# Patient Record
Sex: Female | Born: 1950 | Race: White | Hispanic: No | Marital: Married | Smoking: Former smoker
Health system: Southern US, Community
[De-identification: ages and names within clinical notes are randomized; demographics above are authoritative.]

## PROBLEM LIST (undated history)

## (undated) DIAGNOSIS — M1990 Unspecified osteoarthritis, unspecified site: Secondary | ICD-10-CM

## (undated) DIAGNOSIS — F32A Depression, unspecified: Secondary | ICD-10-CM

## (undated) DIAGNOSIS — K219 Gastro-esophageal reflux disease without esophagitis: Secondary | ICD-10-CM

## (undated) DIAGNOSIS — E7800 Pure hypercholesterolemia, unspecified: Secondary | ICD-10-CM

## (undated) DIAGNOSIS — J40 Bronchitis, not specified as acute or chronic: Secondary | ICD-10-CM

## (undated) DIAGNOSIS — G971 Other reaction to spinal and lumbar puncture: Secondary | ICD-10-CM

## (undated) DIAGNOSIS — D649 Anemia, unspecified: Secondary | ICD-10-CM

## (undated) DIAGNOSIS — K589 Irritable bowel syndrome without diarrhea: Secondary | ICD-10-CM

## (undated) DIAGNOSIS — Z5189 Encounter for other specified aftercare: Secondary | ICD-10-CM

## (undated) DIAGNOSIS — R112 Nausea with vomiting, unspecified: Secondary | ICD-10-CM

## (undated) DIAGNOSIS — F329 Major depressive disorder, single episode, unspecified: Secondary | ICD-10-CM

## (undated) DIAGNOSIS — G8929 Other chronic pain: Secondary | ICD-10-CM

## (undated) DIAGNOSIS — M545 Low back pain: Secondary | ICD-10-CM

## (undated) DIAGNOSIS — Z9889 Other specified postprocedural states: Secondary | ICD-10-CM

## (undated) DIAGNOSIS — N390 Urinary tract infection, site not specified: Secondary | ICD-10-CM

## (undated) DIAGNOSIS — T4145XA Adverse effect of unspecified anesthetic, initial encounter: Secondary | ICD-10-CM

## (undated) HISTORY — PX: BACK SURGERY: SHX140

## (undated) HISTORY — PX: APPENDECTOMY: SHX54

## (undated) HISTORY — PX: CHOLECYSTECTOMY: SHX55

## (undated) HISTORY — PX: BREAST SURGERY: SHX581

## (undated) HISTORY — PX: DILATION AND CURETTAGE OF UTERUS: SHX78

## (undated) HISTORY — PX: HAMMER TOE SURGERY: SHX385

## (undated) HISTORY — PX: POSTERIOR FUSION LUMBAR SPINE: SUR632

## (undated) HISTORY — PX: TUBAL LIGATION: SHX77

## (undated) HISTORY — PX: HAND SURGERY: SHX662

## (undated) HISTORY — PX: NASAL SEPTUM SURGERY: SHX37

## (undated) HISTORY — PX: TONSILLECTOMY AND ADENOIDECTOMY: SUR1326

---

## 1969-07-31 DIAGNOSIS — Z5189 Encounter for other specified aftercare: Secondary | ICD-10-CM

## 1969-07-31 DIAGNOSIS — IMO0001 Reserved for inherently not codable concepts without codable children: Secondary | ICD-10-CM

## 1969-07-31 HISTORY — DX: Reserved for inherently not codable concepts without codable children: IMO0001

## 1969-07-31 HISTORY — PX: DIAGNOSTIC LAPAROSCOPY: SUR761

## 1969-07-31 HISTORY — DX: Encounter for other specified aftercare: Z51.89

## 1987-12-01 HISTORY — PX: HERNIA REPAIR: SHX51

## 1987-12-01 HISTORY — PX: VAGINAL HYSTERECTOMY: SUR661

## 1989-07-31 DIAGNOSIS — T8859XA Other complications of anesthesia, initial encounter: Secondary | ICD-10-CM

## 1989-07-31 HISTORY — DX: Other complications of anesthesia, initial encounter: T88.59XA

## 1998-04-08 ENCOUNTER — Ambulatory Visit (HOSPITAL_COMMUNITY): Admission: RE | Admit: 1998-04-08 | Discharge: 1998-04-08 | Payer: Self-pay | Admitting: Orthopedic Surgery

## 1998-06-13 ENCOUNTER — Other Ambulatory Visit: Admission: RE | Admit: 1998-06-13 | Discharge: 1998-06-13 | Payer: Self-pay | Admitting: Obstetrics & Gynecology

## 1998-08-08 ENCOUNTER — Ambulatory Visit (HOSPITAL_COMMUNITY): Admission: RE | Admit: 1998-08-08 | Discharge: 1998-08-08 | Payer: Self-pay | Admitting: Oral and Maxillofacial Surgery

## 1999-07-04 ENCOUNTER — Other Ambulatory Visit: Admission: RE | Admit: 1999-07-04 | Discharge: 1999-07-04 | Payer: Self-pay | Admitting: Obstetrics & Gynecology

## 1999-07-18 ENCOUNTER — Ambulatory Visit (HOSPITAL_BASED_OUTPATIENT_CLINIC_OR_DEPARTMENT_OTHER): Admission: RE | Admit: 1999-07-18 | Discharge: 1999-07-18 | Payer: Self-pay | Admitting: Orthopedic Surgery

## 2000-08-03 ENCOUNTER — Other Ambulatory Visit: Admission: RE | Admit: 2000-08-03 | Discharge: 2000-08-03 | Payer: Self-pay | Admitting: Obstetrics and Gynecology

## 2001-02-10 ENCOUNTER — Ambulatory Visit (HOSPITAL_COMMUNITY): Admission: RE | Admit: 2001-02-10 | Discharge: 2001-02-10 | Payer: Self-pay | Admitting: Gastroenterology

## 2001-09-26 ENCOUNTER — Other Ambulatory Visit: Admission: RE | Admit: 2001-09-26 | Discharge: 2001-09-26 | Payer: Self-pay | Admitting: Obstetrics & Gynecology

## 2002-12-07 ENCOUNTER — Other Ambulatory Visit: Admission: RE | Admit: 2002-12-07 | Discharge: 2002-12-07 | Payer: Self-pay | Admitting: Obstetrics & Gynecology

## 2004-01-03 ENCOUNTER — Other Ambulatory Visit: Admission: RE | Admit: 2004-01-03 | Discharge: 2004-01-03 | Payer: Self-pay | Admitting: Obstetrics & Gynecology

## 2004-02-08 ENCOUNTER — Encounter: Admission: RE | Admit: 2004-02-08 | Discharge: 2004-02-08 | Payer: Self-pay | Admitting: Gastroenterology

## 2004-02-12 ENCOUNTER — Encounter: Admission: RE | Admit: 2004-02-12 | Discharge: 2004-02-12 | Payer: Self-pay | Admitting: Gastroenterology

## 2004-04-23 ENCOUNTER — Other Ambulatory Visit: Admission: RE | Admit: 2004-04-23 | Discharge: 2004-04-23 | Payer: Self-pay | Admitting: Obstetrics & Gynecology

## 2005-02-05 ENCOUNTER — Ambulatory Visit (HOSPITAL_COMMUNITY): Admission: RE | Admit: 2005-02-05 | Discharge: 2005-02-05 | Payer: Self-pay | Admitting: Gastroenterology

## 2005-08-11 ENCOUNTER — Other Ambulatory Visit: Admission: RE | Admit: 2005-08-11 | Discharge: 2005-08-11 | Payer: Self-pay | Admitting: Obstetrics & Gynecology

## 2006-07-29 ENCOUNTER — Encounter: Admission: RE | Admit: 2006-07-29 | Discharge: 2006-07-29 | Payer: Self-pay | Admitting: Obstetrics & Gynecology

## 2007-04-20 ENCOUNTER — Ambulatory Visit: Payer: Self-pay | Admitting: Cardiology

## 2008-12-03 ENCOUNTER — Encounter: Admission: RE | Admit: 2008-12-03 | Discharge: 2008-12-03 | Payer: Self-pay | Admitting: Orthopedic Surgery

## 2008-12-14 ENCOUNTER — Encounter: Admission: RE | Admit: 2008-12-14 | Discharge: 2008-12-14 | Payer: Self-pay | Admitting: Orthopedic Surgery

## 2009-03-12 ENCOUNTER — Encounter: Admission: RE | Admit: 2009-03-12 | Discharge: 2009-03-12 | Payer: Self-pay | Admitting: Orthopedic Surgery

## 2009-06-21 ENCOUNTER — Ambulatory Visit (HOSPITAL_COMMUNITY): Admission: RE | Admit: 2009-06-21 | Discharge: 2009-06-21 | Payer: Self-pay | Admitting: Neurosurgery

## 2010-06-30 ENCOUNTER — Ambulatory Visit (HOSPITAL_COMMUNITY): Admission: RE | Admit: 2010-06-30 | Discharge: 2010-06-30 | Payer: Self-pay | Admitting: Surgery

## 2010-11-26 ENCOUNTER — Inpatient Hospital Stay (HOSPITAL_COMMUNITY)
Admission: EM | Admit: 2010-11-26 | Discharge: 2010-11-29 | Payer: Self-pay | Source: Home / Self Care | Attending: Gastroenterology | Admitting: Gastroenterology

## 2010-11-30 DIAGNOSIS — D649 Anemia, unspecified: Secondary | ICD-10-CM

## 2010-11-30 HISTORY — DX: Anemia, unspecified: D64.9

## 2010-12-21 ENCOUNTER — Encounter: Payer: Self-pay | Admitting: Obstetrics & Gynecology

## 2010-12-26 NOTE — H&P (Signed)
NAME:  Christine [name], Christine [name]:  [id number]  MEDICAL RECORD NO.:  [id number]          PATIENT TYPE:  EMS  LOCATION:  MAJO                         FACILITY:  [facility]  PHYSICIAN:  Massie Maroon, MD        DATE OF BIRTH:  [birth year]  DATE OF ADMISSION:  11/26/2010 DATE OF DISCHARGE:                             HISTORY & PHYSICAL   CHIEF COMPLAINT:  Diarrhea.  HISTORY OF PRESENT ILLNESS:  This is a 60 year old female with a history of depression and arthritis who apparently has been sick for about 3 weeks.  It started off with diarrhea around November 09, 2010.  She had diarrhea for 3 days and then it went away.  She then developed some pharyngitis and was treated with Mucinex.  She then eventually developed some sinusitis and was treated with Augmentin on December 18th.  She then developed nausea and vomiting and dry heaves on November 24, 2010 and diarrhea as well.  She was seen yesterday at [facility] by Dr. Magda Paganini and started on Flagyl.  She has received about 4 doses but has not been improving and so was sent to the ER for further evaluation. The patient herself denies any fever, chills, bright red blood per rectum.  She does note some black stool, but she has been taking some Pepto-Bismol recently.  The patient's stool was Hemoccult positive.  Her white count was mildly elevated at 13.9.  Lactic acid was within normal limits.  The patient was severely hypokalemic with a potassium of 2.6 which would be consistent with her story of nausea, vomiting and diarrhea.  Liver function tests were normal.  Renal function was intact. Lipase was negative.  CT scan was performed and showed evidence of distal colitis, a pattern which can be seen with infectious colitis including pseudomembranous colitis and ulcerative colitis.  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  There is no evidence of perforation, obstruction or abscess.  The patient will be admitted for  workup of diarrhea and colitis.  PAST MEDICAL HISTORY: 1. Depression. 2. Arthritis.  PAST SURGICAL HISTORY: 1. Umbilical hernia. 2. Hernia. 3. Breast reduction. 4. Breast lump, benign. 5. Bunionectomy. 6. Hammertoe surgery. 7. L4-L5 spinal fusion. 8. Appendectomy. 9. Cholecystectomy. 10.Tonsillectomy and adenoidectomy. 11.C-section.  SOCIAL HISTORY:  The patient does not smoke presently.  She quit in 1975 after smoking for about 2 years.  She occasionally drinks socially.  She lives at home and is married and has 2 children.  FAMILY HISTORY:  Mother is alive at age 30 and has heart disease.  Her father is alive at age 35 and has heart disease.  Her paternal grandmother died of colon cancer possibly in her late 39s.  There is no history of inflammatory bowel disease or celiac disease.  ALLERGIES:  CODEINE (CAUSES RASH), SULFA DRUGS, MULTIPLE PAIN MEDICATIONS THOUGH SHE IS ABLE TO TOLERATE DILAUDID.  MEDICATIONS: 1. Cymbalta 60 mg p.o. b.i.d. 2. Diclofenac unknown dose p.o. b.i.d. 3. Premarin 1 p.o. daily. 4. Prevacid 30 mg p.o. daily p.r.n. 5. Flexeril 10 mg p.o. t.i.d. p.r.n. 6. Flagyl 500 mg p.o.  t.i.d.  REVIEW OF SYSTEMS:  Positive for lower abdominal pain beginning on December 26th when she started to have nausea, vomiting and dry heaves. It is a dull pain.  REVIEW OF SYSTEMS:  Otherwise negative for all organ systems except for pertinent positives stated above.  PHYSICAL EXAMINATION:  VITAL SIGNS:  Temperature 98.9, pulse 97, blood pressure 105/69, pulse oximetry 97% on room air. HEENT:  Anicteric. NECK:  No JVD, no bruit. HEART:  Regular rate and rhythm.  S1-S2.  No murmurs, gallops or rubs. LUNGS:  Lungs are clear to auscultation bilaterally. ABDOMEN:  Soft, slightly obese, slightly tender in the left and right lower quadrants but no guarding or rebound and she does have normoactive bowel sounds.  EXTREMITIES:  No cyanosis, clubbing or edema. SKIN:  No  rashes. LYMPHATICS:  No adenopathy. NEUROLOGIC:  Nonfocal.  LABORATORIES:  WBC 13.9, hemoglobin 12.2, platelet count 243.  Lactic acid 1.4.  Sodium 137, potassium 2.6, BUN 6, creatinine 0.8.  AST 33, ALT 25, alkaline phosphatase 95, total bilirubin 0.5, lipase 29. Urinalysis is positive for nitrites, leukocyte esterase small and wbc's 3-6.  CT scan:  See above HPI for reading.  ASSESSMENT/PLAN: 1. Colitis, possibly Clostridium difficile.  Check stool for fecal     leukocytes, culture Clostridium difficile PCR and ova and     parasites.  We will empirically start her on Cipro 400 mg IV b.i.d.     along with Flagyl 500 mg IV t.i.d.  If C difficile testing is     positive, then I would taper down to Flagyl alone, and if she is     refractory to Flagyl, then consider starting her on vancomycin     orally versus rifaximin.  The patient can have Imodium 2 mg p.o.     daily as needed for diarrhea.  The patient will also be started on     Protonix 40 mg p.o. daily for GI prophylaxis. 2. Hypokalemia.  We will replete her potassium.  Check a magnesium     level and replete magnesium as needed. 3. Depression.  Continue Cymbalta.4. Arthritis.  We will use Tylenol and tramadol and stop diclofenac     for now in light of heme-positive stool.  Also NSAIDs could worsen     colitis. 5. DVT prophylaxis.  SCDs. 6. Disposition.  Consider GI consult in the morning for further     evaluation of colitis, infectious versus ulcerative colitis.     Massie Maroon, MD     JYK/MEDQ  D:  11/26/2010  T:  11/26/2010  Job:  [id number]  cc:   Danise Edge, M.D. Lunette Stands, M.D.  Electronically Signed by Pearson Grippe MD on 12/26/2010 09:00:34 PM

## 2011-02-09 LAB — DIFFERENTIAL
Basophils Absolute: 0.1 10*3/uL (ref 0.0–0.1)
Basophils Relative: 0 % (ref 0–1)
Basophils Relative: 0 % (ref 0–1)
Eosinophils Absolute: 0.2 10*3/uL (ref 0.0–0.7)
Eosinophils Absolute: 0.3 10*3/uL (ref 0.0–0.7)
Eosinophils Relative: 2 % (ref 0–5)
Lymphocytes Relative: 7 % — ABNORMAL LOW (ref 12–46)
Lymphs Abs: 1 10*3/uL (ref 0.7–4.0)
Monocytes Absolute: 0.9 10*3/uL (ref 0.1–1.0)
Monocytes Relative: 7 % (ref 3–12)
Monocytes Relative: 8 % (ref 3–12)
Neutro Abs: 11.7 10*3/uL — ABNORMAL HIGH (ref 1.7–7.7)
Neutrophils Relative %: 77 % (ref 43–77)
Neutrophils Relative %: 84 % — ABNORMAL HIGH (ref 43–77)

## 2011-02-09 LAB — URINALYSIS, ROUTINE W REFLEX MICROSCOPIC
Glucose, UA: NEGATIVE mg/dL
Hgb urine dipstick: NEGATIVE
Ketones, ur: 40 mg/dL — AB
Nitrite: POSITIVE — AB
Protein, ur: 30 mg/dL — AB
Specific Gravity, Urine: 1.024 (ref 1.005–1.030)
Urobilinogen, UA: 0.2 mg/dL (ref 0.0–1.0)
pH: 6 (ref 5.0–8.0)

## 2011-02-09 LAB — GIARDIA/CRYPTOSPORIDIUM SCREEN(EIA)
Cryptosporidium Screen (EIA): NEGATIVE
Giardia Screen - EIA: NEGATIVE

## 2011-02-09 LAB — RETICULIN ANTIBODIES, IGA W TITER: Reticulin Ab, IgA: NEGATIVE

## 2011-02-09 LAB — LACTIC ACID, PLASMA: Lactic Acid, Venous: 1.4 mmol/L (ref 0.5–2.2)

## 2011-02-09 LAB — COMPREHENSIVE METABOLIC PANEL
ALT: 20 U/L (ref 0–35)
ALT: 25 U/L (ref 0–35)
AST: 27 U/L (ref 0–37)
AST: 33 U/L (ref 0–37)
Albumin: 2.5 g/dL — ABNORMAL LOW (ref 3.5–5.2)
Albumin: 3.1 g/dL — ABNORMAL LOW (ref 3.5–5.2)
Alkaline Phosphatase: 77 U/L (ref 39–117)
Alkaline Phosphatase: 95 U/L (ref 39–117)
BUN: 6 mg/dL (ref 6–23)
CO2: 22 mEq/L (ref 19–32)
Calcium: 8.3 mg/dL — ABNORMAL LOW (ref 8.4–10.5)
Chloride: 107 mEq/L (ref 96–112)
Chloride: 107 mEq/L (ref 96–112)
Creatinine, Ser: 0.68 mg/dL (ref 0.4–1.2)
Creatinine, Ser: 0.8 mg/dL (ref 0.4–1.2)
GFR calc Af Amer: >60 mL/min (ref >60)
GFR calc Af Amer: >60 mL/min (ref >60)
GFR calc non Af Amer: >60 mL/min (ref >60)
Glucose, Bld: 129 mg/dL — ABNORMAL HIGH (ref 70–99)
Potassium: 2.6 mEq/L — CL (ref 3.5–5.1)
Potassium: 2.9 mEq/L — ABNORMAL LOW (ref 3.5–5.1)
Sodium: 137 mEq/L (ref 135–145)
Sodium: 137 mEq/L (ref 135–145)
Total Bilirubin: 0.5 mg/dL (ref 0.3–1.2)
Total Bilirubin: 0.6 mg/dL (ref 0.3–1.2)
Total Protein: 6.1 g/dL (ref 6.0–8.3)

## 2011-02-09 LAB — CBC
HCT: 36.5 % (ref 36.0–46.0)
Hemoglobin: 10.7 g/dL — ABNORMAL LOW (ref 12.0–15.0)
Hemoglobin: 12.2 g/dL (ref 12.0–15.0)
MCH: 30.5 pg (ref 26.0–34.0)
MCH: 30.5 pg (ref 26.0–34.0)
MCHC: 33.2 g/dL (ref 30.0–36.0)
MCHC: 33.4 g/dL (ref 30.0–36.0)
MCV: 91.3 fL (ref 78.0–100.0)
Platelets: 243 10*3/uL (ref 150–400)
RBC: 4 MIL/uL (ref 3.87–5.11)
RDW: 12.9 % (ref 11.5–15.5)
WBC: 13.9 10*3/uL — ABNORMAL HIGH (ref 4.0–10.5)

## 2011-02-09 LAB — FECAL LACTOFERRIN, QUANT: Fecal Lactoferrin: POSITIVE

## 2011-02-09 LAB — CLOSTRIDIUM DIFFICILE BY PCR

## 2011-02-09 LAB — STOOL CULTURE

## 2011-02-09 LAB — URINE MICROSCOPIC-ADD ON

## 2011-02-09 LAB — OCCULT BLOOD, POC DEVICE: Fecal Occult Bld: POSITIVE

## 2011-02-09 LAB — SEDIMENTATION RATE: Sed Rate: 20 mm/hr (ref 0–22)

## 2011-02-09 LAB — TISSUE TRANSGLUTAMINASE, IGA: Tissue Transglutaminase Ab, IgA: 6.8 U/mL (ref <20)

## 2011-02-09 LAB — TSH: TSH: 2.804 u[IU]/mL (ref 0.350–4.500)

## 2011-02-09 LAB — LIPASE, BLOOD: Lipase: 29 U/L (ref 11–59)

## 2011-02-14 LAB — SURGICAL PCR SCREEN
MRSA, PCR: NEGATIVE
Staphylococcus aureus: NEGATIVE

## 2011-02-14 LAB — CBC
Hemoglobin: 12.2 g/dL (ref 12.0–15.0)
MCHC: 33.7 g/dL (ref 30.0–36.0)
RDW: 12.9 % (ref 11.5–15.5)

## 2011-02-14 LAB — BASIC METABOLIC PANEL
BUN: 11 mg/dL (ref 6–23)
Calcium: 9 mg/dL (ref 8.4–10.5)
GFR calc non Af Amer: >60 mL/min (ref >60)
Glucose, Bld: 89 mg/dL (ref 70–99)
Sodium: 138 mEq/L (ref 135–145)

## 2011-03-08 LAB — DIFFERENTIAL
Basophils Absolute: 0 10*3/uL (ref 0.0–0.1)
Basophils Relative: 1 % (ref 0–1)
Eosinophils Relative: 1 % (ref 0–5)
Monocytes Absolute: 0.5 10*3/uL (ref 0.1–1.0)

## 2011-03-08 LAB — CBC
HCT: 36.5 % (ref 36.0–46.0)
Hemoglobin: 12.7 g/dL (ref 12.0–15.0)
MCHC: 34.8 g/dL (ref 30.0–36.0)
MCV: 91.5 fL (ref 78.0–100.0)
RDW: 12.6 % (ref 11.5–15.5)

## 2011-04-14 NOTE — Op Note (Signed)
NAME:  Christine [name], Christine [name]:  [id number]   MEDICAL RECORD NO.:  [id number]          PATIENT TYPE:  OIB   LOCATION:  3599                         FACILITY:  [facility]   PHYSICIAN:  Henry A. [name], M.D.    DATE OF BIRTH:  05-07-1951   DATE OF PROCEDURE:  06/21/2009  DATE OF DISCHARGE:                               OPERATIVE REPORT   PREOPERATIVE DIAGNOSIS:  Left L5-S1 herniated nucleus pulposus with  radiculopathy.   POSTOPERATIVE DIAGNOSIS:  Left L5-S1 herniated nucleus pulposus with  radiculopathy.   PROCEDURE:  Left L5-S1 laminotomy microdiskectomy.   SURGEON:  Christine Maser. Pool, MD   ASSISTANT:  None.   ANESTHESIA:  General endotracheal.   INDICATION:  Ms. [name] is a 60 year old female with history of back and  left lower extremity pain.  She has a left-sided S1 radiculopathy  failing conservative management.  Workup demonstrated evidence of a  small left-sided L5-S1 disk herniation within the lateral recess causing  marked compression of left-sided S1 nerve root.  The patient presents  now for microdiskectomy in hopes of improving her symptoms.   OPERATIVE NOTE:  The patient was taken to the operating room table and  placed in the supine position.  Adequate level of anesthesia was  achieved.  The patient was positioned onto Wilson frame, appropriately  padded the patient's lumbar region and prepped and draped sterilely.  A  #10 blade was used to make a curvilinear skin incision overlying the L4-  L5 interspace.  This carried sharply in the midline.  Subperiosteal  dissection was performed exposing lamina and facet joints of L4 and L5  on the left side.  Deep self-retaining retractors were placed.  Intraoperative x-rays were taken and level was confirmed.  Laminotomy  was then performed using high speed drill and Kerrison rongeurs.  Underlying thecal sac and exiting S1 nerve root.  Microscope was brought  in and microdissection on left-sided S1 nerve root and  underlying disk  herniation coagulated and cut.  Thecal sac and S1 nerve root gently  mobilized and tracked towards midline.  A focal disk herniation was  encountered just beneath where the S1 nerve root had been.  This was  then incised with #15 blade and removed using pituitary rongeurs.  All  loose or obviously degenerative disk was then removed from the  interspace.  At this point, a very thorough diskectomy was achieved.  There was no injury to thecal sac or nerve roots.  Wound was then  irrigated with antibiotic solution.  Gelfoam was placed topically for  hemostasis and found to be good.  Microscope and retractors were then  removed.  Hemostasis was then achieved with electrocautery.  The wounds  were then closed in layers with Vicryl suture.  Steri-Strips and sterile  dressings were applied.  There were no complications.  The patient  tolerated the procedure well and she returned to the recovery room  postoperatively.          ______________________________  Christine [name], M.D.    HAP/MEDQ  D:  06/21/2009  T:  06/22/2009  Job:  [id number] 

## 2011-04-14 NOTE — Assessment & Plan Note (Signed)
[name]                            CARDIOLOGY OFFICE NOTE   NAME:Christine [name]                       MRN:          [id number]  DATE:04/20/2007                            DOB:          May 14, 1951    Dr. Abbey Chatters has asked me to consult on Christine [name] for mixed  hyperlipidemia.   She is a delightful 60 year old married white female who has had  hyperlipidemia that was detected a couple of years ago.  Diet has  failed.  Her total cholesterol is 245, triglycerides 228, HDL 55, LDL  144, VLDL 46.  Her total cholesterol and HDL ratio is 4.5.  Her TSH was  normal.   Her other cardiac risk factors are fairly negative.  She is mildly  overweight and because of arthritis is fairly in mobile.  She does enjoy  walking, but has been limited with knee problems.   Her father had a heart attack in his early 59s, but had other risk  factors.   She quit smoking in 1975.  She does not have hypertension or diabetes.   PAST MEDICAL HISTORY:   CURRENT MEDICATIONS:  1. Diclofenac 150 mg a day.  2. Cymbalta 30 mg a day.  3. Premarin 0.92 mg a day.   PAST SURGICAL HISTORY:  C section, hysterectomy, umbilical hernia  repair, tonsillectomy, appendectomy, __________ , spinal fusion, breast  tumors and breast reduction.  Dates were listed in the chart.   FAMILY HISTORY:  As above.   SOCIAL HISTORY:  She is a Field seismologist.  She is  fairly sedentary at work.   She is married and has 2 children.   REVIEW OF SYSTEMS:  Other than the HPI, positive for constipation,  urinary problems and arthritis, but negative otherwise.   EXAMINATION:  She is 5 foot 5 1/4 inches, she weighs 186 pounds.  HEENT:  Normocephalic, atraumatic, PERRLA, extraocular movements intact,  sclerae clear, facial symmetry is normal, carotids are full without  bruits, there is no JVD, thyroid is not enlarged, trachea is midline.  LUNGS:  Clear.  HEART:  Reveals a  normal S1, S2 without murmur or gallop.  ABDOMINAL:  Soft, good bowel sounds.  EXTREMITIES:  Reveal no edema, pulses are intact.  She has varicose  veins, no evidence of DVT.  NEURO:  Intact.  SKIN:  Intact.   Her electrocardiogram is normal.   ASSESSMENT/PLAN:  I have had about a 20 minute discussion with Mrs.  Christine [name] about her cardiovascular risk.  She certainly is in a low risk  category at the present time because of being relatively young and no  other major risk factors other than her hyperlipidemia.  We spent most  of the time talking about therapeutic lifestyle changes including  __________  bicycle for 3 hours per week, decreasing complex and simple  sugars, and maintaining her weight.   At the present time I do not think she needs pharmacological therapy.  I  have asked her to check back with [place] in about 3-5 years as guidelines  change and other things  change.     Thomas C. Daleen Squibb, MD, [facility]  Electronically Signed    TCW/MedQ  DD: 04/20/2007  DT: 04/20/2007  Job #: [id number]

## 2011-04-17 NOTE — Procedures (Signed)
[place]  Patient:    Christine [name], Christine [name]                      MRN: [id number] Proc. Date: 02/10/01 Attending:  Verlin Grills, M.D.                           Procedure Report  PROCEDURE:  Colonoscopy.  PROCEDURE INDICATION:  [name] (date of birth 04-14-1951) is a 60 year old female, who has intermittent hematochezia.  She is due for surveillance colonoscopy and polypectomy to prevent colon cancer.  She had a maternal grandmother with colon cancer but no first-degree relatives with colon cancer.  ENDOSCOPIST:  Verlin Grills, M.D.  PREMEDICATION:  Fentanyl 105 mcg, Versed 10 mg.  ENDOSCOPE:  Olympus pediatric colonoscope.  DESCRIPTION OF PROCEDURE:  After obtaining informed consent, the patient was placed in the left lateral decubitus position.  I administered intravenous fentanyl and intravenous Versed to achieve conscious sedation for the procedure.  The patients blood pressure, oxygen saturation, and cardiac rhythm were monitored throughout the procedure and documented in the medical record.  Anal inspection was normal.  Digital rectal exam was normal.  The Olympus pediatric video colonoscope was introduced into the rectum and under direct vision advanced to the cecum, as identified by a normal-appearing ileocecal valve.  Colonic preparation for the exam today was excellent.  Rectum normal.  Sigmoid colon normal.  Descending colon normal.  Splenic flexure normal.  Transverse colon normal.  Hepatic flexure normal.  Ascending colon normal.  Cecum and ileocecal valve normal.  ASSESSMENT:  Normal proctocolonoscopy to the cecum.  No endoscopic evidence for the presence of colorectal neoplasia.  RECOMMENDATIONS:  [name] is at average risk for the development of colon cancer during her lifetime.  She should undergo a repeat colonoscopy in approximately 10 years.DD:  02/10/01 TD:   02/10/01 Job: [id number]

## 2011-11-03 ENCOUNTER — Emergency Department (HOSPITAL_COMMUNITY)
Admission: EM | Admit: 2011-11-03 | Discharge: 2011-11-04 | Disposition: A | Discharge status: Home / Self Care | Payer: 59 | Attending: Emergency Medicine | Admitting: Emergency Medicine

## 2011-11-03 ENCOUNTER — Encounter: Payer: Self-pay | Admitting: Emergency Medicine

## 2011-11-03 DIAGNOSIS — Y92009 Unspecified place in unspecified non-institutional (private) residence as the place of occurrence of the external cause: Secondary | ICD-10-CM | POA: Insufficient documentation

## 2011-11-03 DIAGNOSIS — Z79899 Other long term (current) drug therapy: Secondary | ICD-10-CM | POA: Insufficient documentation

## 2011-11-03 DIAGNOSIS — S81009A Unspecified open wound, unspecified knee, initial encounter: Secondary | ICD-10-CM | POA: Insufficient documentation

## 2011-11-03 DIAGNOSIS — W540XXA Bitten by dog, initial encounter: Secondary | ICD-10-CM | POA: Insufficient documentation

## 2011-11-03 DIAGNOSIS — S91009A Unspecified open wound, unspecified ankle, initial encounter: Secondary | ICD-10-CM | POA: Insufficient documentation

## 2011-11-03 DIAGNOSIS — S8000XA Contusion of unspecified knee, initial encounter: Secondary | ICD-10-CM | POA: Insufficient documentation

## 2011-11-03 DIAGNOSIS — IMO0002 Reserved for concepts with insufficient information to code with codable children: Secondary | ICD-10-CM | POA: Insufficient documentation

## 2011-11-03 DIAGNOSIS — S5010XA Contusion of unspecified forearm, initial encounter: Secondary | ICD-10-CM | POA: Insufficient documentation

## 2011-11-03 DIAGNOSIS — S51809A Unspecified open wound of unspecified forearm, initial encounter: Secondary | ICD-10-CM | POA: Insufficient documentation

## 2011-11-03 MED ORDER — ONDANSETRON 4 MG PO TBDP
4.0000 mg | ORAL_TABLET | Freq: Once | ORAL | Status: AC
  Administered 2011-11-03: 4 mg via ORAL
  Filled 2011-11-03: qty 1

## 2011-11-03 MED ORDER — TETANUS-DIPHTHERIA TOXOIDS TD 5-2 LFU IM INJ
0.5000 mL | INJECTION | Freq: Once | INTRAMUSCULAR | Status: AC
  Administered 2011-11-03: 0.5 mL via INTRAMUSCULAR
  Filled 2011-11-03: qty 0.5

## 2011-11-03 MED ORDER — BACITRACIN-NEOMYCIN-POLYMYXIN OINTMENT TUBE
TOPICAL_OINTMENT | Freq: Every day | CUTANEOUS | Status: DC
  Administered 2011-11-03: via TOPICAL
  Filled 2011-11-03: qty 15

## 2011-11-03 NOTE — ED Notes (Signed)
PT. PRESENTS WITH MULTIPLE DOG BITE AT LEFT FOREARM , RIGHT KNEE / LEFT KNEE AND LEFT FOREHEAD BRUISE THIS EVENING  . BLEEDING CONTROLLED.

## 2011-11-04 MED ORDER — MUPIROCIN CALCIUM 2 % NA OINT: TOPICAL_OINTMENT | NASAL | Status: AC

## 2011-11-04 NOTE — ED Provider Notes (Signed)
Medical screening examination/treatment/procedure(s) were performed by non-physician practitioner and as supervising physician I was immediately available for consultation/collaboration.   Hanley Seamen, MD 11/04/11 [phone]

## 2011-11-04 NOTE — ED Provider Notes (Signed)
History     CSN: [id number] Arrival date & time: 11/03/2011  8:00 PM   First MD Initiated Contact with Patient 11/03/11 2256      Chief Complaint  Patient presents with  . Animal Bite    (Consider location/radiation/quality/duration/timing/severity/associated sxs/prior treatment) HPI Comments: Patient has been careing for a stray dog for the past 9 months for the last 3 weeks.  Dog has been in the home has received first rabies immunization recently had puppies.  Patient went to touch in all and was bitten on the left forearm bilateral knees.  One small abrasion to right breast.  Patient's last tetanus was greater than 5 years ago.  Right forearm shows some bruising under the bite, as well as left medial knee.  No decrease in range of motion of extremities.  Patient is able to ambulate without difficulty  Patient is a 60 y.o. female presenting with animal bite. The history is provided by the patient.  Animal Bite  The incident occurred just prior to arrival. The incident occurred at home. There is an injury to the chest. There is an injury to the left forearm. There is an injury to the right knee and left knee. The pain is mild. It is unlikely that a foreign body is present. Pertinent negatives include no numbness, no inability to bear weight, no neck pain, no focal weakness and no weakness. There have been no prior injuries to these areas. She is right-handed. Her tetanus status is out of date.    History reviewed. No pertinent past medical history.  Past Surgical History  Procedure Date  . Appendectomy   . Tonsillectomy   . Back surgery   . Cholecystectomy     No family history on file.  History  Substance Use Topics  . Smoking status: Never Smoker   . Smokeless tobacco: Not on file  . Alcohol Use: Yes    OB History    Grav Para Term Preterm Abortions TAB SAB Ect Mult Living                  Review of Systems  Constitutional: Negative.   HENT: Negative.  Negative for  neck pain.   Eyes: Negative.   Respiratory: Negative.   Cardiovascular: Negative.   Gastrointestinal: Negative.   Genitourinary: Negative.   Musculoskeletal: Negative for joint swelling.  Neurological: Negative for focal weakness, weakness and numbness.  Hematological: Negative.   Psychiatric/Behavioral: Negative.     Allergies  Other and Sulfa antibiotics  Home Medications   Current Outpatient Rx  Name Route Sig Dispense Refill  . DICLOFENAC SODIUM 75 MG PO TBEC Oral Take 75 mg by mouth 2 (two) times daily.      . DULOXETINE HCL 60 MG PO CPEP Oral Take 60 mg by mouth daily.      Marland Kitchen ESTROGENS CONJUGATED 0.9 MG PO TABS Oral Take 0.9 mg by mouth 2 (two) times daily.      Marland Kitchen MUPIROCIN CALCIUM 2 % NA OINT  Apply in each nostril daily 10 g 0    BP 148/100  Pulse 107  Temp(Src) 98.5 F (36.9 C) (Oral)  Resp 20  SpO2 98%  Physical Exam  Constitutional: She is oriented to person, place, and time. She appears well-developed.  HENT:  Head: Normocephalic.  Eyes: Pupils are equal, round, and reactive to light.  Neck: Neck supple.  Cardiovascular: Normal rate.   Pulmonary/Chest: Effort normal.  Abdominal: Soft.  Musculoskeletal: Normal range of motion.  Abrasions with bruising and superficial puncture wounds to right forearm.  Distal anterior portion.  Abrasions and contusions to left medial knee with superficial puncture. Abrasion to anterior right knee Small abrasion, right breast  Neurological: She is alert and oriented to person, place, and time.  Skin: Skin is warm.  Psychiatric: She has a normal mood and affect.    ED Course  Procedures (including critical care time)  Labs Reviewed - No data to display No results found.   1. Bite from dog     All wounds scrubbed with Betadine, dressed with Neosporin tetanus updated.  Due to patient's history of C. difficile.  She has requested that we hold off on administering antibiotics.  At this time.  She will watch closely  for any sign of infection.  Followup with her doctor  MDM  Abrasions punctures lacerations after wound clean and most are abrasions, superficial pinching, and superficial punctures.  No sutures required        Arman Filter, NP 11/04/11 0050  Arman Filter, NP 11/04/11 [phone]

## 2012-03-31 ENCOUNTER — Encounter (HOSPITAL_COMMUNITY): Payer: Self-pay | Admitting: Pharmacy Technician

## 2012-04-07 ENCOUNTER — Other Ambulatory Visit (HOSPITAL_COMMUNITY): Payer: 59

## 2012-04-07 ENCOUNTER — Encounter (HOSPITAL_COMMUNITY)
Admission: RE | Admit: 2012-04-07 | Discharge: 2012-04-07 | Disposition: A | Discharge status: Home / Self Care | Payer: 59 | Source: Ambulatory Visit | Attending: Orthopedic Surgery | Admitting: Orthopedic Surgery

## 2012-04-07 ENCOUNTER — Encounter (HOSPITAL_COMMUNITY)
Admission: RE | Admit: 2012-04-07 | Discharge: 2012-04-07 | Disposition: A | Discharge status: Home / Self Care | Payer: 59 | Source: Ambulatory Visit | Attending: Surgery | Admitting: Surgery

## 2012-04-07 ENCOUNTER — Encounter (HOSPITAL_COMMUNITY): Payer: Self-pay

## 2012-04-07 HISTORY — DX: Encounter for other specified aftercare: Z51.89

## 2012-04-07 HISTORY — DX: Anemia, unspecified: D64.9

## 2012-04-07 HISTORY — DX: Adverse effect of unspecified anesthetic, initial encounter: T41.45XA

## 2012-04-07 HISTORY — DX: Gastro-esophageal reflux disease without esophagitis: K21.9

## 2012-04-07 HISTORY — DX: Major depressive disorder, single episode, unspecified: F32.9

## 2012-04-07 HISTORY — DX: Unspecified osteoarthritis, unspecified site: M19.90

## 2012-04-07 HISTORY — DX: Depression, unspecified: F32.A

## 2012-04-07 LAB — CBC
HCT: 36.3 % (ref 36.0–46.0)
Hemoglobin: 12.2 g/dL (ref 12.0–15.0)
MCV: 92.6 fL (ref 78.0–100.0)
Platelets: 319 10*3/uL (ref 150–400)
RBC: 3.92 MIL/uL (ref 3.87–5.11)
WBC: 6.2 10*3/uL (ref 4.0–10.5)

## 2012-04-07 LAB — COMPREHENSIVE METABOLIC PANEL
ALT: 19 U/L (ref 0–35)
AST: 20 U/L (ref 0–37)
CO2: 28 mEq/L (ref 19–32)
Chloride: 101 mEq/L (ref 96–112)
Creatinine, Ser: 0.61 mg/dL (ref 0.50–1.10)
GFR calc Af Amer: >90 mL/min (ref >90)
GFR calc non Af Amer: >90 mL/min (ref >90)
Glucose, Bld: 85 mg/dL (ref 70–99)
Total Bilirubin: 0.3 mg/dL (ref 0.3–1.2)

## 2012-04-07 LAB — URINALYSIS, ROUTINE W REFLEX MICROSCOPIC
Bilirubin Urine: NEGATIVE
Glucose, UA: NEGATIVE mg/dL
Ketones, ur: NEGATIVE mg/dL
pH: 6 (ref 5.0–8.0)

## 2012-04-07 LAB — PROTIME-INR: INR: 0.91 (ref 0.00–1.49)

## 2012-04-07 LAB — TYPE AND SCREEN
ABO/RH(D): B POS
Antibody Screen: NEGATIVE

## 2012-04-07 NOTE — Pre-Procedure Instructions (Signed)
20 Christine [name]  04/07/2012   Your procedure is scheduled on:  04/13/2012  Report to [facility] at 0750 AM.  Call this number if you have problems the morning of surgery: [phone]   Remember:   Do not eat food:After Midnight.  May have clear liquids: up to 4 Hours before arrival.0350   Do not drink liquids after  0350 am the day of surgery.   Clear liquids include soda, tea, black coffee, apple or grape juice, broth.  Take these medicines the morning of surgery with A SIP OF WATER: cymbalta  Premarin    Do not wear jewelry, make-up or nail polish.  Do not wear lotions, powders, or perfumes. You may wear deodorant.  Do not shave 48 hours prior to surgery.  Do not bring valuables to the hospital.  Contacts, dentures or bridgework may not be worn into surgery.  Leave suitcase in the car. After surgery it may be brought to your room.  For patients admitted to the hospital, checkout time is 11:00 AM the day of discharge.   Patients discharged the day of surgery will not be allowed to drive home.  Name and phone number of your driver: Bernette Redbird ---spouse      Special Instructions: CHG Shower Use Special Wash: 1/2 bottle night before surgery and 1/2 bottle morning of surgery.   Please read over the following fact sheets that you were given: Pain Booklet, Coughing and Deep Breathing, Blood Transfusion Information, Total Joint Packet and MRSA Information

## 2012-04-12 MED ORDER — CEFAZOLIN SODIUM-DEXTROSE 2-3 GM-% IV SOLR
2.0000 g | INTRAVENOUS | Status: AC
  Administered 2012-04-13: 2 g via INTRAVENOUS
  Filled 2012-04-12: qty 50

## 2012-04-12 NOTE — H&P (Signed)
[facility] [address]   [address] [place], [place] [address] [phone] A Division of [facility]  Loreta Ave, M.D.     Robert A. Thurston Hole, M.D.     Lunette Stands, M.D. Eulas Post, M.D.    Buford Dresser, M.D. Estell Harpin, M.D. Ralene Cork, D.O.          Genene Churn. Barry Dienes, PA-C            Kirstin A. Shepperson, PA-C [place], OPA-C   RE: Daryan, [place]                                [phone]      DOB: 02-04-1951 PROGRESS NOTE: 04-05-12 Chief complaint: Right knee pain.  History of present illness: 58 one year-old white female with a history of end stage DJD, right knee, and chronic pain.  Returns.  States that knee pain unchanged from previous visit.  She is wanting to proceed with total knee replacement as scheduled.  Gastroenterologist is Dr. Danise Edge and patient states that he also handles primary care.  Patient states that last year she was prescribed Amoxicillin for a sinus infection and then eventually had GI issues related to c-diff.  She required hospital admission for four days.  She also admits to having a history of acid reflux and anemia.  We have not received pre-op medical clearance.  States that she has had a colonoscopy in the past.  Denies nausea, vomiting, abdominal pain, constipation, diarrhea, melena or hematochezia.   Current medications: Premarin, Voltaren, Cymbalta, Flexeril, Vitamin D, fish oil and multivitamin. Allergies: Codeine, Percocet, Vicodin, Darvocet and Morphine caused nausea.  Sulfa causes a rash. Past medical/surgical history: C-diff requiring hospital admission in 2012, C-section, cholecystectomy, hysterectomy, bunion surgery, lumbar microdiscectomy, tonsillectomy and left hand surgery. Review of systems: Patient currently denies fevers, chills, lightheadedness, dizziness, cardiac, pulmonary, GI or GU issues.   Family history: Positive for heart disease,  hypertension, diabetes and cancer. Social history: Quit smoking in 1975 and smoked one-half pack per day, admits occasional alcohol consumption.  Patient is married.        EXAMINATION: Height: 5?4.  Weight: 176 pounds.  Temperature: 98.2.  Pulse: 80.  Respirations: 16.  Blood pressure: 144/90.  Pleasant white female, alert and oriented x 3 and in no acute distress.  No increase in respiratory effort.  Head is normocephalic, a traumatic.  PERRLA, EOMI.  Cervical spine unremarkable.  Lungs: CTA bilaterally.  No wheezes.  Heart: RRR.  No murmurs noted.  Abdomen: Round and non-distended.  NBS x 4.  Soft and non-tender.  Right knee: Decreased range of motion.  Positive crepitus.  Joint line tender.  Positive effusion.  Ligaments stable.  Calf non-tender.  Neurovascularly intact.  Skin warm and dry.   IMPRESSION: End stage DJD, right knee, and chronic pain.  Failed conservative treatment.  PLAN: We will proceed with right total knee replacement as scheduled. Surgical procedure, along with potential rehab/recovery time discussed. All questions answered. Due to her GI issues that we discussed today we will see if we can get a letter of clearance from Dr. Danise Edge. I did speak with Dr. Henriette Combs nurse today and she will contact Ms. [name] for an appointment this week.  Post-op she will need short rehab placement at [facility].  All questions answered.    Loreta Ave, M.D. Electronically verified  by Loreta Ave, M.D. DFM(JMO):jjh Cc: Dr. Danise Edge, fax: [phone] D 04-06-12 T 04-07-12

## 2012-04-13 ENCOUNTER — Encounter (HOSPITAL_COMMUNITY): Payer: Self-pay | Admitting: *Deleted

## 2012-04-13 ENCOUNTER — Encounter (HOSPITAL_COMMUNITY)
Admission: RE | Disposition: A | Discharge status: Home / Self Care | Payer: Self-pay | Source: Ambulatory Visit | Attending: Orthopedic Surgery

## 2012-04-13 ENCOUNTER — Encounter (HOSPITAL_COMMUNITY): Payer: Self-pay | Admitting: Anesthesiology

## 2012-04-13 ENCOUNTER — Ambulatory Visit (HOSPITAL_COMMUNITY): Payer: 59 | Admitting: Anesthesiology

## 2012-04-13 ENCOUNTER — Inpatient Hospital Stay (HOSPITAL_COMMUNITY)
Admission: RE | Admit: 2012-04-13 | Discharge: 2012-04-15 | DRG: 470 | Disposition: A | Discharge status: Home / Self Care | Payer: 59 | Source: Ambulatory Visit | Attending: Orthopedic Surgery | Admitting: Orthopedic Surgery

## 2012-04-13 ENCOUNTER — Inpatient Hospital Stay (HOSPITAL_COMMUNITY): Payer: 59

## 2012-04-13 DIAGNOSIS — F3289 Other specified depressive episodes: Secondary | ICD-10-CM | POA: Diagnosis present

## 2012-04-13 DIAGNOSIS — Z90710 Acquired absence of both cervix and uterus: Secondary | ICD-10-CM

## 2012-04-13 DIAGNOSIS — Z471 Aftercare following joint replacement surgery: Secondary | ICD-10-CM

## 2012-04-13 DIAGNOSIS — F329 Major depressive disorder, single episode, unspecified: Secondary | ICD-10-CM | POA: Diagnosis present

## 2012-04-13 DIAGNOSIS — D649 Anemia, unspecified: Secondary | ICD-10-CM | POA: Diagnosis present

## 2012-04-13 DIAGNOSIS — Z87891 Personal history of nicotine dependence: Secondary | ICD-10-CM

## 2012-04-13 DIAGNOSIS — Z882 Allergy status to sulfonamides status: Secondary | ICD-10-CM

## 2012-04-13 DIAGNOSIS — Z885 Allergy status to narcotic agent status: Secondary | ICD-10-CM

## 2012-04-13 DIAGNOSIS — G8929 Other chronic pain: Secondary | ICD-10-CM | POA: Diagnosis present

## 2012-04-13 DIAGNOSIS — M1710 Unilateral primary osteoarthritis, unspecified knee: Principal | ICD-10-CM | POA: Diagnosis present

## 2012-04-13 DIAGNOSIS — K219 Gastro-esophageal reflux disease without esophagitis: Secondary | ICD-10-CM | POA: Diagnosis present

## 2012-04-13 DIAGNOSIS — Z9089 Acquired absence of other organs: Secondary | ICD-10-CM

## 2012-04-13 HISTORY — PX: TOTAL KNEE ARTHROPLASTY: SHX125

## 2012-04-13 SURGERY — ARTHROPLASTY, KNEE, TOTAL
Anesthesia: General | Site: Knee | Laterality: Right | Wound class: Clean

## 2012-04-13 MED ORDER — DEXAMETHASONE SODIUM PHOSPHATE 4 MG/ML IJ SOLN
INTRAMUSCULAR | Status: DC | PRN
  Administered 2012-04-13: 8 mg via INTRAVENOUS

## 2012-04-13 MED ORDER — ONDANSETRON HCL 4 MG/2ML IJ SOLN: 4.0000 mg | Freq: Four times a day (QID) | INTRAMUSCULAR | Status: DC | PRN

## 2012-04-13 MED ORDER — METHOCARBAMOL 500 MG PO TABS
500.0000 mg | ORAL_TABLET | Freq: Four times a day (QID) | ORAL | Status: DC | PRN
  Administered 2012-04-13 – 2012-04-15 (×4): 500 mg via ORAL
  Filled 2012-04-13 (×4): qty 1

## 2012-04-13 MED ORDER — ENOXAPARIN SODIUM 30 MG/0.3ML ~~LOC~~ SOLN
30.0000 mg | Freq: Two times a day (BID) | SUBCUTANEOUS | Status: DC
  Administered 2012-04-14 – 2012-04-15 (×3): 30 mg via SUBCUTANEOUS
  Filled 2012-04-13 (×5): qty 0.3

## 2012-04-13 MED ORDER — DROPERIDOL 2.5 MG/ML IJ SOLN: 0.6250 mg | INTRAMUSCULAR | Status: DC | PRN

## 2012-04-13 MED ORDER — METOCLOPRAMIDE HCL 5 MG/ML IJ SOLN
5.0000 mg | Freq: Three times a day (TID) | INTRAMUSCULAR | Status: DC | PRN
  Filled 2012-04-13: qty 2

## 2012-04-13 MED ORDER — METHOCARBAMOL 100 MG/ML IJ SOLN
500.0000 mg | INTRAVENOUS | Status: AC
  Administered 2012-04-13: 500 mg via INTRAVENOUS
  Filled 2012-04-13: qty 5

## 2012-04-13 MED ORDER — DULOXETINE HCL 60 MG PO CPEP
60.0000 mg | ORAL_CAPSULE | Freq: Every day | ORAL | Status: DC
  Administered 2012-04-14 – 2012-04-15 (×2): 60 mg via ORAL
  Filled 2012-04-13 (×2): qty 1

## 2012-04-13 MED ORDER — METHOCARBAMOL 100 MG/ML IJ SOLN
500.0000 mg | Freq: Four times a day (QID) | INTRAVENOUS | Status: DC | PRN
  Filled 2012-04-13: qty 5

## 2012-04-13 MED ORDER — DIPHENHYDRAMINE HCL 12.5 MG/5ML PO ELIX
12.5000 mg | ORAL_SOLUTION | Freq: Four times a day (QID) | ORAL | Status: DC | PRN
  Administered 2012-04-13 – 2012-04-14 (×2): 12.5 mg via ORAL
  Filled 2012-04-13 (×2): qty 10

## 2012-04-13 MED ORDER — NALOXONE HCL 0.4 MG/ML IJ SOLN: 0.4000 mg | INTRAMUSCULAR | Status: DC | PRN

## 2012-04-13 MED ORDER — HYDROMORPHONE 0.3 MG/ML IV SOLN
INTRAVENOUS | Status: DC
  Administered 2012-04-13: 12:00:00 via INTRAVENOUS
  Administered 2012-04-13: 0.953 mg via INTRAVENOUS
  Administered 2012-04-13: 1.8 mg via INTRAVENOUS
  Administered 2012-04-13: 0.68 mg via INTRAVENOUS
  Administered 2012-04-14: 0.9 mg via INTRAVENOUS
  Administered 2012-04-14: 1.37 mg via INTRAVENOUS
  Administered 2012-04-14: 07:00:00 via INTRAVENOUS
  Filled 2012-04-13: qty 25

## 2012-04-13 MED ORDER — FENTANYL CITRATE 0.05 MG/ML IJ SOLN
INTRAMUSCULAR | Status: AC
  Filled 2012-04-13: qty 2

## 2012-04-13 MED ORDER — DIPHENHYDRAMINE HCL 50 MG/ML IJ SOLN: 12.5000 mg | Freq: Four times a day (QID) | INTRAMUSCULAR | Status: DC | PRN

## 2012-04-13 MED ORDER — CEFAZOLIN SODIUM 1-5 GM-% IV SOLN
1.0000 g | Freq: Three times a day (TID) | INTRAVENOUS | Status: AC
  Administered 2012-04-13 – 2012-04-14 (×3): 1 g via INTRAVENOUS
  Filled 2012-04-13 (×4): qty 50

## 2012-04-13 MED ORDER — POTASSIUM CHLORIDE IN NACL 20-0.9 MEQ/L-% IV SOLN
INTRAVENOUS | Status: DC
  Administered 2012-04-13: 16:00:00 via INTRAVENOUS
  Filled 2012-04-13 (×6): qty 1000

## 2012-04-13 MED ORDER — LIDOCAINE HCL (CARDIAC) 20 MG/ML IV SOLN
INTRAVENOUS | Status: DC | PRN
  Administered 2012-04-13: 100 mg via INTRAVENOUS

## 2012-04-13 MED ORDER — PHENOL 1.4 % MT LIQD: 1.0000 | OROMUCOSAL | Status: DC | PRN

## 2012-04-13 MED ORDER — ACETAMINOPHEN 650 MG RE SUPP: 650.0000 mg | Freq: Four times a day (QID) | RECTAL | Status: DC | PRN

## 2012-04-13 MED ORDER — ACETAMINOPHEN 325 MG PO TABS
650.0000 mg | ORAL_TABLET | Freq: Four times a day (QID) | ORAL | Status: DC | PRN
  Administered 2012-04-14 – 2012-04-15 (×3): 650 mg via ORAL
  Filled 2012-04-13 (×3): qty 2

## 2012-04-13 MED ORDER — MENTHOL 3 MG MT LOZG
1.0000 | LOZENGE | OROMUCOSAL | Status: DC | PRN
  Filled 2012-04-13: qty 9

## 2012-04-13 MED ORDER — SODIUM CHLORIDE 0.9 % IJ SOLN: 9.0000 mL | INTRAMUSCULAR | Status: DC | PRN

## 2012-04-13 MED ORDER — WARFARIN VIDEO
Freq: Once | Status: AC
  Administered 2012-04-13: 16:00:00

## 2012-04-13 MED ORDER — PROPOFOL 10 MG/ML IV BOLUS
INTRAVENOUS | Status: DC | PRN
  Administered 2012-04-13: 200 mg via INTRAVENOUS

## 2012-04-13 MED ORDER — LACTATED RINGERS IV SOLN
INTRAVENOUS | Status: DC
  Administered 2012-04-13 (×2): via INTRAVENOUS

## 2012-04-13 MED ORDER — OMEPRAZOLE MAGNESIUM 20 MG PO TBEC: 20.0000 mg | DELAYED_RELEASE_TABLET | Freq: Every day | ORAL | Status: DC

## 2012-04-13 MED ORDER — MIDAZOLAM HCL 5 MG/5ML IJ SOLN
INTRAMUSCULAR | Status: DC | PRN
  Administered 2012-04-13: 1 mg via INTRAVENOUS

## 2012-04-13 MED ORDER — BUPIVACAINE HCL (PF) 0.25 % IJ SOLN
INTRAMUSCULAR | Status: DC | PRN
  Administered 2012-04-13: 30 mL

## 2012-04-13 MED ORDER — FENTANYL CITRATE 0.05 MG/ML IJ SOLN
100.0000 ug | Freq: Once | INTRAMUSCULAR | Status: AC
  Administered 2012-04-13: 100 ug via INTRAVENOUS

## 2012-04-13 MED ORDER — ONDANSETRON HCL 4 MG PO TABS
4.0000 mg | ORAL_TABLET | Freq: Four times a day (QID) | ORAL | Status: DC | PRN
  Administered 2012-04-14 – 2012-04-15 (×3): 4 mg via ORAL
  Filled 2012-04-13 (×3): qty 1

## 2012-04-13 MED ORDER — BUPIVACAINE-EPINEPHRINE PF 0.5-1:200000 % IJ SOLN
INTRAMUSCULAR | Status: DC | PRN
  Administered 2012-04-13: 150 mg

## 2012-04-13 MED ORDER — SCOPOLAMINE 1 MG/3DAYS TD PT72
1.0000 | MEDICATED_PATCH | TRANSDERMAL | Status: DC
  Administered 2012-04-13: 1.5 mg via TRANSDERMAL
  Filled 2012-04-13: qty 1

## 2012-04-13 MED ORDER — MIDAZOLAM HCL 2 MG/2ML IJ SOLN
2.0000 mg | Freq: Once | INTRAMUSCULAR | Status: AC
  Administered 2012-04-13: 2 mg via INTRAVENOUS

## 2012-04-13 MED ORDER — MIDAZOLAM HCL 2 MG/2ML IJ SOLN
INTRAMUSCULAR | Status: AC
  Filled 2012-04-13: qty 2

## 2012-04-13 MED ORDER — ONDANSETRON HCL 4 MG/2ML IJ SOLN
INTRAMUSCULAR | Status: DC | PRN
  Administered 2012-04-13: 4 mg via INTRAVENOUS

## 2012-04-13 MED ORDER — HYDROMORPHONE HCL PF 1 MG/ML IJ SOLN: 0.2500 mg | INTRAMUSCULAR | Status: DC | PRN

## 2012-04-13 MED ORDER — ACETAMINOPHEN 10 MG/ML IV SOLN
INTRAVENOUS | Status: AC
  Filled 2012-04-13: qty 100

## 2012-04-13 MED ORDER — SENNOSIDES-DOCUSATE SODIUM 8.6-50 MG PO TABS: 1.0000 | ORAL_TABLET | Freq: Every evening | ORAL | Status: DC | PRN

## 2012-04-13 MED ORDER — WARFARIN SODIUM 5 MG PO TABS
5.0000 mg | ORAL_TABLET | Freq: Once | ORAL | Status: AC
  Administered 2012-04-13: 5 mg via ORAL
  Filled 2012-04-13: qty 1

## 2012-04-13 MED ORDER — METOCLOPRAMIDE HCL 10 MG PO TABS
5.0000 mg | ORAL_TABLET | Freq: Three times a day (TID) | ORAL | Status: DC | PRN
  Administered 2012-04-14 – 2012-04-15 (×2): 10 mg via ORAL
  Filled 2012-04-13: qty 2
  Filled 2012-04-13: qty 1

## 2012-04-13 MED ORDER — PANTOPRAZOLE SODIUM 40 MG PO TBEC
40.0000 mg | DELAYED_RELEASE_TABLET | Freq: Every day | ORAL | Status: DC
  Administered 2012-04-14 – 2012-04-15 (×2): 40 mg via ORAL
  Filled 2012-04-13 (×2): qty 1

## 2012-04-13 MED ORDER — FENTANYL CITRATE 0.05 MG/ML IJ SOLN
INTRAMUSCULAR | Status: DC | PRN
  Administered 2012-04-13: 150 ug via INTRAVENOUS
  Administered 2012-04-13: 100 ug via INTRAVENOUS

## 2012-04-13 MED ORDER — SODIUM CHLORIDE 0.9 % IR SOLN
Status: DC | PRN
  Administered 2012-04-13: 1000 mL

## 2012-04-13 MED ORDER — DOCUSATE SODIUM 100 MG PO CAPS
100.0000 mg | ORAL_CAPSULE | Freq: Two times a day (BID) | ORAL | Status: DC
  Administered 2012-04-13 – 2012-04-15 (×5): 100 mg via ORAL
  Filled 2012-04-13 (×6): qty 1

## 2012-04-13 MED ORDER — COUMADIN BOOK
Freq: Once | Status: AC
  Administered 2012-04-13: 16:00:00
  Filled 2012-04-13: qty 1

## 2012-04-13 MED ORDER — WARFARIN - PHARMACIST DOSING INPATIENT: Freq: Every day | Status: DC

## 2012-04-13 SURGICAL SUPPLY — 56 items
BANDAGE ESMARK 6X9 LF (GAUZE/BANDAGES/DRESSINGS) ×1 IMPLANT
BLADE SAG 18X100X1.27 (BLADE) ×4 IMPLANT
BNDG CMPR 9X6 STRL LF SNTH (GAUZE/BANDAGES/DRESSINGS) ×1
BNDG ESMARK 6X9 LF (GAUZE/BANDAGES/DRESSINGS) ×2
BOOTCOVER CLEANROOM LRG (PROTECTIVE WEAR) ×4 IMPLANT
BOWL SMART MIX CTS (DISPOSABLE) ×2 IMPLANT
CEMENT BONE SIMPLEX SPEEDSET (Cement) ×4 IMPLANT
CLOTH BEACON ORANGE TIMEOUT ST (SAFETY) ×2 IMPLANT
COVER BACK TABLE 24X17X13 BIG (DRAPES) ×2 IMPLANT
COVER SURGICAL LIGHT HANDLE (MISCELLANEOUS) ×2 IMPLANT
CUFF TOURNIQUET SINGLE 34IN LL (TOURNIQUET CUFF) ×2 IMPLANT
DRAPE EXTREMITY T 121X128X90 (DRAPE) ×2 IMPLANT
DRAPE PROXIMA HALF (DRAPES) ×2 IMPLANT
DRAPE U-SHAPE 47X51 STRL (DRAPES) ×2 IMPLANT
DRSG PAD ABDOMINAL 8X10 ST (GAUZE/BANDAGES/DRESSINGS) ×3 IMPLANT
DURAPREP 26ML APPLICATOR (WOUND CARE) ×2 IMPLANT
ELECT CAUTERY BLADE 6.4 (BLADE) ×2 IMPLANT
ELECT REM PT RETURN 9FT ADLT (ELECTROSURGICAL) ×2
ELECTRODE REM PT RTRN 9FT ADLT (ELECTROSURGICAL) ×1 IMPLANT
EVACUATOR 1/8 PVC DRAIN (DRAIN) ×2 IMPLANT
FACESHIELD LNG OPTICON STERILE (SAFETY) ×2 IMPLANT
GAUZE XEROFORM 5X9 LF (GAUZE/BANDAGES/DRESSINGS) ×2 IMPLANT
GLOVE BIOGEL PI IND STRL 8 (GLOVE) ×1 IMPLANT
GLOVE BIOGEL PI INDICATOR 8 (GLOVE) ×1
GLOVE ORTHO TXT STRL SZ7.5 (GLOVE) ×4 IMPLANT
GOWN PREVENTION PLUS XLARGE (GOWN DISPOSABLE) ×4 IMPLANT
GOWN STRL NON-REIN LRG LVL3 (GOWN DISPOSABLE) ×3 IMPLANT
GOWN STRL REIN 2XL XLG LVL4 (GOWN DISPOSABLE) ×2 IMPLANT
HANDPIECE INTERPULSE COAX TIP (DISPOSABLE) ×2
IMMOBILIZER KNEE 22 UNIV (SOFTGOODS) ×2 IMPLANT
IMMOBILIZER KNEE 24 THIGH 36 (MISCELLANEOUS) IMPLANT
IMMOBILIZER KNEE 24 UNIV (MISCELLANEOUS)
KIT BASIN OR (CUSTOM PROCEDURE TRAY) ×2 IMPLANT
KIT ROOM TURNOVER OR (KITS) ×2 IMPLANT
MANIFOLD NEPTUNE II (INSTRUMENTS) ×2 IMPLANT
NS IRRIG 1000ML POUR BTL (IV SOLUTION) ×2 IMPLANT
PACK TOTAL JOINT (CUSTOM PROCEDURE TRAY) ×2 IMPLANT
PAD ARMBOARD 7.5X6 YLW CONV (MISCELLANEOUS) ×4 IMPLANT
PAD CAST 4YDX4 CTTN HI CHSV (CAST SUPPLIES) ×1 IMPLANT
PADDING CAST COTTON 4X4 STRL (CAST SUPPLIES) ×2
PADDING CAST COTTON 6X4 STRL (CAST SUPPLIES) ×2 IMPLANT
RUBBERBAND STERILE (MISCELLANEOUS) ×2 IMPLANT
SET HNDPC FAN SPRY TIP SCT (DISPOSABLE) ×1 IMPLANT
SPONGE GAUZE 4X4 12PLY (GAUZE/BANDAGES/DRESSINGS) ×2 IMPLANT
STAPLER VISISTAT 35W (STAPLE) ×2 IMPLANT
SUCTION FRAZIER TIP 10 FR DISP (SUCTIONS) ×2 IMPLANT
SUT VIC AB 1 CTX 36 (SUTURE) ×4
SUT VIC AB 1 CTX36XBRD ANBCTR (SUTURE) ×2 IMPLANT
SUT VIC AB 2-0 CT1 27 (SUTURE) ×4
SUT VIC AB 2-0 CT1 TAPERPNT 27 (SUTURE) ×2 IMPLANT
SYR 30ML LL (SYRINGE) ×2 IMPLANT
SYR 30ML SLIP (SYRINGE) ×2 IMPLANT
TOWEL OR 17X24 6PK STRL BLUE (TOWEL DISPOSABLE) ×2 IMPLANT
TOWEL OR 17X26 10 PK STRL BLUE (TOWEL DISPOSABLE) ×2 IMPLANT
TRAY FOLEY CATH 14FR (SET/KITS/TRAYS/PACK) ×2 IMPLANT
WATER STERILE IRR 1000ML POUR (IV SOLUTION) ×4 IMPLANT

## 2012-04-13 NOTE — Plan of Care (Signed)
Problem: Consults Goal: Diagnosis- Total Joint Replacement Primary Total Knee Right     

## 2012-04-13 NOTE — Anesthesia Procedure Notes (Addendum)
Anesthesia Regional Block:  Femoral nerve block  Pre-Anesthetic Checklist: ,, timeout performed, Correct Patient, Correct Site, Correct Laterality, Correct Procedure,, site marked, risks and benefits discussed, Surgical consent,  Pre-op evaluation,  At surgeon's request and post-op pain management  Laterality: Right  Prep: chloraprep       Needles:  Injection technique: Single-shot  Needle Type: Echogenic Stimulator Needle     Needle Length: 5cm 5 cm Needle Gauge: 22 and 22 G    Additional Needles:  Procedures: ultrasound guided and nerve stimulator Femoral nerve block  Nerve Stimulator or Paresthesia:  Response: quadraceps contraction, 0.45 mA,   Additional Responses:   Narrative:  Start time: 04/13/2012 9:28 AM End time: 04/13/2012 9:38 AM Injection made incrementally with aspirations every 5 mL.  Performed by: Personally  Anesthesiologist: Halford Decamp, MD  Additional Notes: Functioning IV was confirmed and monitors were applied.  A 50mm 22ga Arrow echogenic stimulator needle was used. Sterile prep and drape,hand hygiene and sterile gloves were used. Ultrasound guidance: relevant anatomy identified, needle position confirmed, local anesthetic spread visualized around nerve(s)., vascular puncture avoided.  Image printed for medical record. Negative aspiration and negative test dose prior to incremental administration of local anesthetic. The patient tolerated the procedure well.    Femoral nerve block Procedure Name: LMA Insertion Date/Time: 04/13/2012 10:07 AM Performed by: Marena Chancy Pre-anesthesia Checklist: Patient identified, Emergency Drugs available, Suction available and Patient being monitored Patient Re-evaluated:Patient Re-evaluated prior to inductionOxygen Delivery Method: Circle system utilized Preoxygenation: Pre-oxygenation with 100% oxygen Intubation Type: IV induction LMA: LMA inserted LMA Size: 4.0 Number of attempts: 1 Placement  Confirmation: positive ETCO2 and breath sounds checked- equal and bilateral Tube secured with: Tape Dental Injury: Teeth and Oropharynx as per pre-operative assessment

## 2012-04-13 NOTE — Interval H&P Note (Signed)
History and Physical Interval Note:  04/13/2012 8:18 AM  Christine [name]  has presented today for surgery, with the diagnosis of DJD RIGHT KNEE  The various methods of treatment have been discussed with the patient and family. After consideration of risks, benefits and other options for treatment, the patient has consented to  Procedure(s) (LRB): TOTAL KNEE ARTHROPLASTY (Right) as a surgical intervention .  The patients' history has been reviewed, patient examined, no change in status, stable for surgery.  I have reviewed the patients' chart and labs.  Questions were answered to the patient's satisfaction.     Darrio Bade F

## 2012-04-13 NOTE — Anesthesia Preprocedure Evaluation (Signed)
Anesthesia Evaluation  Patient identified by MRN, date of birth, ID band Patient awake    Reviewed: Allergy & Precautions, H&P , NPO status   Airway Mallampati: II TM Distance: >3 FB Neck ROM: Full    Dental  (+) Teeth Intact and Dental Advisory Given   Pulmonary neg pulmonary ROS,  breath sounds clear to auscultation  Pulmonary exam normal       Cardiovascular negative cardio ROS  Rhythm:Regular Rate:Normal     Neuro/Psych PSYCHIATRIC DISORDERS Depression negative neurological ROS     GI/Hepatic Neg liver ROS, GERD-  Medicated,  Endo/Other  negative endocrine ROS  Renal/GU negative Renal ROS     Musculoskeletal  (+) Arthritis -, Osteoarthritis,    Abdominal   Peds  Hematology   Anesthesia Other Findings   Reproductive/Obstetrics                           Anesthesia Physical Anesthesia Plan  ASA: II  Anesthesia Plan: General   Post-op Pain Management:    Induction: Intravenous  Airway Management Planned: LMA  Additional Equipment:   Intra-op Plan:   Post-operative Plan: Extubation in OR  Informed Consent: I have reviewed the patients History and Physical, chart, labs and discussed the procedure including the risks, benefits and alternatives for the proposed anesthesia with the patient or authorized representative who has indicated his/her understanding and acceptance.   Dental advisory given  Plan Discussed with: CRNA, Anesthesiologist and Surgeon  Anesthesia Plan Comments:         Anesthesia Quick Evaluation

## 2012-04-13 NOTE — Preoperative (Signed)
Beta Blockers   Reason not to administer Beta Blockers:Not Applicable 

## 2012-04-13 NOTE — Transfer of Care (Signed)
Immediate Anesthesia Transfer of Care Note  Patient: Christine [name]  Procedure(s) Performed: Procedure(s) (LRB): TOTAL KNEE ARTHROPLASTY (Right)  Patient Location: PACU  Anesthesia Type: General  Level of Consciousness: awake, alert  and oriented  Airway & Oxygen Therapy: Patient Spontanous Breathing  Post-op Assessment: Report given to PACU RN and Post -op Vital signs reviewed and stable  Post vital signs: Reviewed and stable  Complications: No apparent anesthesia complications

## 2012-04-13 NOTE — Progress Notes (Signed)
ANTICOAGULATION CONSULT NOTE - Initial Consult  Pharmacy Consult for Coumadin Indication: VTE prophylaxis  Allergies  Allergen Reactions  . Other Nausea And Vomiting    Allergic to pain medications.   . Sulfa Antibiotics Rash    Patient Measurements:  Weight: 79kg  Vital Signs: Temp: 97.3 F (36.3 C) (05/15 1340) Temp src: Oral (05/15 1340) BP: 109/64 mmHg (05/15 1340) Pulse Rate: 73  (05/15 1340)  Labs: No results found for this basename: HGB:2,HCT:3,PLT:3,APTT:3,LABPROT:3,INR:3,HEPARINUNFRC:3,CREATININE:3,CKTOTAL:3,CKMB:3,TROPONINI:3 in the last 72 hours  CrCl is unknown because there is no height on file for the current visit.   Medical History: Past Medical History  Diagnosis Date  . Complication of anesthesia 90's    remembers the ett placement ... not quiet asleep   but unable to talk.   . Anemia 2012    "slight"  . Blood transfusion 70's    bleeding  after a fertility exploratory surgery   . GERD (gastroesophageal reflux disease)   . Arthritis   . Depression     Medications:  Prescriptions prior to admission  Medication Sig Dispense Refill  . cholecalciferol (VITAMIN D) 1000 UNITS tablet Take 2,000 Units by mouth daily.      . cyclobenzaprine (FLEXERIL) 5 MG tablet Take 5-10 mg by mouth at bedtime as needed. Muscle spasm.      . diclofenac (VOLTAREN) 75 MG EC tablet Take 75 mg by mouth 2 (two) times daily.        . DULoxetine (CYMBALTA) 60 MG capsule Take 60 mg by mouth daily.        Marland Kitchen estrogens, conjugated, (PREMARIN) 0.9 MG tablet Take 0.9 mg by mouth 2 (two) times daily.        . fish oil-omega-3 fatty acids 1000 MG capsule Take 1 g by mouth daily.      . Multiple Vitamins-Minerals (MULTIVITAMINS THER. W/MINERALS) TABS Take 1 tablet by mouth daily.      Marland Kitchen omeprazole (PRILOSEC OTC) 20 MG tablet Take 20 mg by mouth daily.        Assessment: 60yof to start Coumadin for VTE prophylaxis s/p TKA. Patient will also be on Lovenox 30mg  q12h until INR >1.8 per  MD orders.  - Baseline INR: 0.91 - H/H and Plts wnl - Warfarin points: 3  Goal of Therapy:  INR 2-3   Plan:  1. Coumadin 5mg  po x 1 today 2. Daily PT/INR 3. Coumadin educational book/video  Cleon Dew [phone] 04/13/2012,3:42 PM

## 2012-04-13 NOTE — Brief Op Note (Signed)
04/13/2012  12:52 PM  PATIENT:  Valrie Hart  61 y.o. female  PRE-OPERATIVE DIAGNOSIS:  DJD RIGHT KNEE  POST-OPERATIVE DIAGNOSIS:  DJD RIGHT KNEE  PROCEDURE:  Procedure(s) (LRB): TOTAL KNEE ARTHROPLASTY (Right)  SURGEON:  Surgeon(s) and Role:    * Loreta Ave, MD - Primary  PHYSICIAN ASSISTANT: Zonia Kief M  ANESTHESIA:   regional and general  EBL:  Total I/O In: 1300 [I.V.:1300] Out: -  SPECIMEN:  No Specimen  DISPOSITION OF SPECIMEN:  N/A  COUNTS:  YES  TOURNIQUET:   Total Tourniquet Time Documented: Thigh (Right) - 61 minutes  PATIENT DISPOSITION:  PACU - hemodynamically stable.

## 2012-04-13 NOTE — Anesthesia Postprocedure Evaluation (Signed)
Anesthesia Post Note  Patient: Christine [name]  Procedure(s) Performed: Procedure(s) (LRB): TOTAL KNEE ARTHROPLASTY (Right)  Anesthesia type: general  Patient location: PACU  Post pain: Pain level controlled  Post assessment: Patient's Cardiovascular Status Stable  Last Vitals:  Filed Vitals:   04/13/12 1135  BP:   Pulse:   Temp: 36.4 C  Resp:     Post vital signs: Reviewed and stable  Level of consciousness: sedated  Complications: No apparent anesthesia complications

## 2012-04-14 ENCOUNTER — Encounter (HOSPITAL_COMMUNITY): Payer: Self-pay | Admitting: Orthopedic Surgery

## 2012-04-14 LAB — BASIC METABOLIC PANEL
Calcium: 8.3 mg/dL — ABNORMAL LOW (ref 8.4–10.5)
GFR calc non Af Amer: >90 mL/min (ref >90)
Glucose, Bld: 115 mg/dL — ABNORMAL HIGH (ref 70–99)
Sodium: 140 mEq/L (ref 135–145)

## 2012-04-14 LAB — PROTIME-INR: Prothrombin Time: 14.3 seconds (ref 11.6–15.2)

## 2012-04-14 LAB — CBC
Hemoglobin: 9.5 g/dL — ABNORMAL LOW (ref 12.0–15.0)
MCH: 30.7 pg (ref 26.0–34.0)
MCHC: 33.5 g/dL (ref 30.0–36.0)

## 2012-04-14 MED ORDER — MORPHINE SULFATE 2 MG/ML IJ SOLN
2.0000 mg | INTRAMUSCULAR | Status: DC | PRN
  Administered 2012-04-14 – 2012-04-15 (×3): 2 mg via INTRAVENOUS
  Filled 2012-04-14 (×4): qty 1

## 2012-04-14 MED ORDER — OXYCODONE-ACETAMINOPHEN 5-325 MG PO TABS
1.0000 | ORAL_TABLET | ORAL | Status: DC | PRN
  Administered 2012-04-14 – 2012-04-15 (×5): 1 via ORAL
  Filled 2012-04-14 (×5): qty 1

## 2012-04-14 MED ORDER — WARFARIN SODIUM 5 MG PO TABS
5.0000 mg | ORAL_TABLET | Freq: Once | ORAL | Status: AC
  Administered 2012-04-14: 5 mg via ORAL
  Filled 2012-04-14: qty 1

## 2012-04-14 NOTE — Op Note (Signed)
NAMETAMAKA, SAWIN NO.:  [id number]  MEDICAL RECORD NO.:  [id number]  LOCATION:  5028                         FACILITY:  [facility]  PHYSICIAN:  Loreta Ave, M.D. DATE OF BIRTH:  [birth year]  DATE OF PROCEDURE:  04/13/2012 DATE OF DISCHARGE:                              OPERATIVE REPORT   PREOPERATIVE DIAGNOSIS:  Right knee end-stage degenerative arthritis, varus alignment.  POSTOPERATIVE DIAGNOSIS:  Right knee end-stage degenerative arthritis, varus alignment.  PROCEDURE:  Modified minimally invasive right total knee replacement, Stryker triathlon prosthesis.  Soft tissue balancing.  Cemented pegged posterior stabilized #4 femoral component.  Cemented #4 tibial component, 9-mm polyethylene insert.  Cemented resurfacing 35-mm patellar component.  SURGEON:  Loreta Ave, MD.  ASSISTANT:  Zonia Kief PA, present throughout the entire case necessary for timely completion of procedure.  ANESTHESIA:  General.  ESTIMATED BLOOD LOSS:  Minimal.  SPECIMENS:  None.  CULTURES:  None.  COMPLICATION:  None.  DRESSINGS:  Soft compressive, knee immobilizer.  DRAINS:  Hemovac x1.  TOURNIQUET TIME:  One hour.  PROCEDURE:  The patient was brought to the operating room, placed on operating table supine position.  After adequate anesthesia been obtained, knee examined.  Almost full extension, flexion better than 90. Varus alignment correctable.  Tourniquet applied.  Prepped and draped in usual sterile fashion.  Exsanguinated with elevation, Esmarch tourniquet inflated to 350 mmHg.  Straight incision above the patella down to tibial tubercle.  Skin and subcutaneous tissue divided.  Medial arthrotomy, vastus splitting, preserving quad tendon.  Knee exposed. Grade 4 change throughout.  Medial capsule release.  Remnants of menisci spurs, cruciate ligaments removed.  Distal femur exposed. Intramedullary guide placed.  An 8-mm resection, 5 degrees of  valgus. Using epicondylar axis, sized, cut, and fitted for a posterior stabilized #4 femoral component.  Proximal tibia exposed. Extramedullary guide.  3 degree posterior slope cut.  Size #4 component. Debris cleared throughout the knee.  Anterior-posterior.  Patella exposed.  Posterior 10 mm removed.  Drilled, sized, and fitted for a 35- mm component.  Trials put in place throughout.  With the #4 components above and below the 9-mm insert and 35 patella, good biomechanical access.  Nicely balanced in flexion extension.  Excellent patellofemoral tracking.  Tibia was marked for rotation and reamed.  All trials removed.  Copious irrigation with pulse irrigating device.  Cement prepared, placed on all components.  Polyethylene attached to tibia knee reduced.  Patella held with a clamp.  Once the cement hardened, knee was irrigated again.  Arthrotomy closed #1 Vicryl.  Skin and subcutaneous tissue with Vicryl and staples.  Sterile compressive dressing applied. Tourniquet deflated removed.  Knee immobilizer applied.  Anesthesia reversed.  Brought to recovery room.  Tolerated surgery well.  No complications.     Loreta Ave, M.D.     DFM/MEDQ  D:  04/13/2012  T:  04/14/2012  Job:  [id number]

## 2012-04-14 NOTE — Progress Notes (Signed)
Subjective: Doing well.  Pain controlled   Objective: Vital signs in last 24 hours: Temp:  [97.3 F (36.3 C)-98.4 F (36.9 C)] 98.4 F (36.9 C) (05/16 0535) Pulse Rate:  [69-83] 83  (05/16 0535) Resp:  [13-20] 18  (05/16 0535) BP: (95-110)/(57-64) 99/57 mmHg (05/16 0535) SpO2:  [98 %-100 %] 98 % (05/16 0535) Weight:  [79 kg (174 lb 2.6 oz)] 79 kg (174 lb 2.6 oz) (05/16 1025)  Intake/Output from previous day: 05/15 0701 - 05/16 0700 In: 1540 [P.O.:240; I.V.:1300] Out: 2300 [Urine:2050; Drains:250] Intake/Output this shift:     Basename 04/14/12 0530  HGB 9.5*    Basename 04/14/12 0530  WBC 10.5  RBC 3.09*  HCT 28.4*  PLT 255    Basename 04/14/12 0530  NA 140  K 4.2  CL 105  CO2 25  BUN 6  CREATININE 0.57  GLUCOSE 115*  CALCIUM 8.3*    Basename 04/14/12 0530  LABPT --  INR 1.09    Exam:  Some bleeding thru dressing.  Calf nt, nvi.    Assessment/Plan: Patient prefers to discharge home instead of going to rehab center.  PT today.  D/c dilaudid pca earlier due to itching.     Ferman Basilio M 04/14/2012, 10:42 AM

## 2012-04-14 NOTE — Progress Notes (Signed)
ANTICOAGULATION CONSULT NOTE - Follow Up Consult  Pharmacy Consult for Coumadin Indication: VTE prophylaxis  Allergies  Allergen Reactions  . Other Nausea And Vomiting    Allergic to pain medications.   . Sulfa Antibiotics Rash    Patient Measurements: Height: 5\' 5"  (165.1 cm) Weight: 174 lb 2.6 oz (79 kg) (est) IBW/kg (Calculated) : 57  Heparin Dosing Weight:   Vital Signs: Temp: 98.4 F (36.9 C) (05/16 0535) BP: 99/57 mmHg (05/16 0535) Pulse Rate: 83  (05/16 0535)  Labs:  Basename 04/14/12 0530  HGB 9.5*  HCT 28.4*  PLT 255  APTT --  LABPROT 14.3  INR 1.09  HEPARINUNFRC --  CREATININE 0.57  CKTOTAL --  CKMB --  TROPONINI --    Estimated Creatinine Clearance: 76.7 ml/min (by C-G formula based on Cr of 0.57).   Medications:  Scheduled:    .  ceFAZolin (ANCEF) IV  1 g Intravenous Q8H  . coumadin book   Does not apply Once  . docusate sodium  100 mg Oral BID  . DULoxetine  60 mg Oral Daily  . enoxaparin  30 mg Subcutaneous Q12H  . methocarbamol (ROBAXIN) IV  500 mg Intravenous To PACU  . pantoprazole  40 mg Oral Q1200  . warfarin  5 mg Oral ONCE-1800  . warfarin   Does not apply Once  . Warfarin - Pharmacist Dosing Inpatient   Does not apply q1800  . DISCONTD: HYDROmorphone PCA 0.3 mg/mL   Intravenous Q4H  . DISCONTD: omeprazole  20 mg Oral Daily  . DISCONTD: scopolamine  1 patch Transdermal Q72H    Assessment: 60yof to start Coumadin for VTE prophylaxis s/p TKA.   Patient will also be on Lovenox 30mg  q12h until INR >1.8 per MD orders.   - Baseline INR: 0.91  - H/H and Plts wnl  - Warfarin points: 3  Goal of Therapy:  INR 2-3 Monitor platelets by anticoagulation protocol: Yes   Plan:  -Coumadin 5 mg po x1 -Daily INR -Continue lovenox until INR > 1.8  Chelcy Bolda L. Illene Bolus, PharmD, BCPS Clinical Pharmacist Pager: [phone] 04/14/2012 12:43 PM

## 2012-04-14 NOTE — Evaluation (Signed)
Occupational Therapy Evaluation Patient Details Name: Christine [name] MRN: [id number] DOB: 1951/04/30 Today's Date: 04/14/2012 Time: 4098-1191 OT Time Calculation (min): 14 min  OT Assessment / Plan / Recommendation Clinical Impression  Pt doing very well POD 1 RTKR. All education completed. Pt will have necessary level of A upong d/c and all necessary DME.    OT Assessment  Patient does not need any further OT services    Follow Up Recommendations  No OT follow up    Barriers to Discharge      Equipment Recommendations  None recommended by OT    Recommendations for Other Services    Frequency       Precautions / Restrictions Precautions Precautions: Knee Required Braces or Orthoses: Knee Immobilizer - Right Knee Immobilizer - Right: On except when in CPM Restrictions Weight Bearing Restrictions: No Other Position/Activity Restrictions: WBAT   Pertinent Vitals/Pain Reported 7/10 pain in R knee. Repositioned and cold applied.    ADL  Grooming: Simulated;Supervision/safety Where Assessed - Grooming: Supported standing Upper Body Bathing: Simulated;Set up Where Assessed - Upper Body Bathing: Unsupported sitting Lower Body Bathing: Simulated;Minimal assistance Where Assessed - Lower Body Bathing: Supported sit to stand Upper Body Dressing: Simulated;Set up Where Assessed - Upper Body Dressing: Unsupported sitting Lower Body Dressing: Simulated;Minimal assistance Where Assessed - Lower Body Dressing: Sopported sit to stand Toilet Transfer: Performed;Supervision/safety Toilet Transfer Method: Sit to Barista: Raised toilet seat with arms (or 3-in-1 over toilet) Toileting - Clothing Manipulation and Hygiene: Simulated;Supervision/safety Where Assessed - Engineer, mining and Hygiene: Sit to stand from 3-in-1 or toilet Tub/Shower Transfer: Other (comment) (Educated pt how to safely utilize tub transfer bench.) Equipment Used: Rolling  walker;Other (comment) (3:1)    OT Diagnosis:    OT Problem List:   OT Treatment Interventions:     OT Goals    Visit Information  Last OT Received On: 04/14/12 Assistance Needed: +1    Subjective Data  Subjective: "I'm not going to need to go to rehab." Patient Stated Goal: Return home with prn A from husband.   Prior Functioning  Home Living Lives With: Spouse Available Help at Discharge: Family;Available 24 hours/day (husband to be home for 1 week) Type of Home: House Home Access: Stairs to enter [employer] of Steps: 3 Entrance Stairs-Rails: Left Home Layout: Two level;Able to live on main level with bedroom/bathroom Alternate Level Stairs-Number of Steps: 14 Alternate Level Stairs-Rails: Right Bathroom Shower/Tub: Tub/shower unit;Curtain Bathroom Toilet: Handicapped height Bathroom Accessibility: Yes How Accessible: Accessible via walker (sideways) Home Adaptive Equipment: Bedside commode/3-in-1;Tub transfer bench;Walker - rolling Prior Function Level of Independence: Independent Able to Take Stairs?: Yes Driving: Yes Vocation: Full time employment 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[employer], 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 Communication Communication: No difficulties Dominant Hand: Right    Cognition  Overall Cognitive Status: Appears within functional limits for tasks assessed/performed Arousal/Alertness: Awake/alert Orientation Level: Appears intact for tasks assessed Behavior During Session: [facility] for tasks performed    Extremity/Trunk Assessment  Trunk Assessment Trunk Assessment: Normal   Mobility  Details for Bed Mobility Assistance: assist for R LE management Transfers Sit to Stand: 4: Min guard;From chair/3-in-1;With upper extremity assist Stand to Sit: 4: Min guard;With upper extremity assist;With armrests;To chair/3-in-1 Details for Transfer Assistance: min amt of VCs for hand placement and LE position.   Exercise    Balance   End of Session OT - End of  Session Activity Tolerance: Patient tolerated treatment well Patient left: in chair;with call bell/phone within reach  Bradin Mcadory A OTR/L [phone] 04/14/2012, 10:43 AM

## 2012-04-14 NOTE — Progress Notes (Signed)
Physical Therapy Evaluation Note  Past Medical History  Diagnosis Date  . Complication of anesthesia 90's    remembers the ett placement ... not quiet asleep   but unable to talk.   . Anemia 2012    "slight"  . Blood transfusion 70's    bleeding  after a fertility exploratory surgery   . GERD (gastroesophageal reflux disease)   . Arthritis   . Depression    Past Surgical History  Procedure Date  . Appendectomy   . Tonsillectomy   . Back surgery   . Abdominal hysterectomy 89  . Dilation and curettage of uterus 90's  . Hernia repair 89    umbilical  . Cholecystectomy 90     04/14/12 0747  PT Visit Information  Last PT Received On 04/14/12  Assistance Needed +1  PT Time Calculation  PT Start Time 0747  PT Stop Time 0816  PT Time Calculation (min) 29 min  Subjective Data  Subjective Pt received supine in bed with report of 4/10 R knee pain.  Precautions  Precautions Knee  Knee Immobilizer - Right On when out of bed or walking  Restrictions  Weight Bearing Restrictions Yes  RLE Weight Bearing WBAT  Home Living  Lives With Spouse  Available Help at Discharge Family;Available 24 hours/day (husband to be home for 1 week)  Type of Home House  Home Access Stairs to enter  Entrance Stairs-Number of Steps 3  Entrance Stairs-Rails Left  Home Layout Two level;Able to live on main level with bedroom/bathroom  Alternate Level Stairs-Number of Steps 14  Alternate Level Stairs-Rails Right  Bathroom Shower/Tub Tub/shower unit;Curtain  Bathroom Toilet Handicapped height  Bathroom Accessibility Yes  How Accessible Accessible via walker (sideways)  Home Adaptive Equipment Bedside commode/3-in-1;Tub transfer bench;Walker - rolling  Prior Function  Level of Independence Independent  Able to Take Stairs? 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  Communication  Communication No difficulties  Cognition  Overall  Cognitive Status Appears within functional limits for tasks assessed/performed  Arousal/Alertness Awake/alert  Orientation Level Appears intact for tasks assessed;Oriented X4 / Intact  Behavior During Session [facility] for tasks performed  Right Upper Extremity Assessment  RUE ROM/Strength/Tone [facility] for tasks assessed  Left Upper Extremity Assessment  LUE ROM/Strength/Tone WFL for tasks assessed  Right Lower Extremity Assessment  RLE ROM/Strength/Tone Deficits;Due to pain (no active quad set at this time, 10 deg R knee active flex)  Left Lower Extremity Assessment  LLE ROM/Strength/Tone WFL for tasks assessed  Trunk Assessment  Trunk Assessment Normal  Bed Mobility  Bed Mobility Supine to Sit  Supine to Sit 4: Min assist;HOB flat  Details for Bed Mobility Assistance assist for R LE management  Transfers  Transfers Sit to Stand;Stand to Sit  Sit to Stand 4: Min assist;With upper extremity assist;From bed  Stand to Sit 4: Min assist;With upper extremity assist;With armrests;To chair/3-in-1 (to [employer])  Details for Transfer Assistance v/c's for hand placement and R LE management  Ambulation/Gait  Ambulation/Gait Assistance 4: Min assist  Ambulation Distance (Feet) 25 Feet  Assistive device Rolling walker  Ambulation/Gait Assistance Details v/c's for walker management and sequencing, R KI on  Gait Pattern Step-to pattern;Decreased step length - right;Decreased stance time - right;Antalgic  Stairs No  Balance  Balance Assessed Yes  Static Standing Balance  Static Standing - Balance Support Left upper extremity supported;During functional activity (brushed teeth and washed face)  Static Standing - Level  of Assistance 5: Stand by assistance  Static Standing - Comment/# of Minutes pt stood x 5 min  Exercises  Exercises Total Joint (handout provided)  Total Joint Exercises  Ankle Circles/Pumps AROM;5 reps;Supine  Quad Sets AROM;Right;5 reps;Supine  Knee Flexion AROM;Right;5 reps;Supine    Goniometric ROM active R knee flexion 20 deg  PT - End of Session  Equipment Utilized During Treatment Gait belt;Right knee immobilizer  Activity Tolerance Patient limited by fatigue;Patient limited by pain  Patient left in chair;with call bell/phone within reach  Nurse Communication Mobility status  PT Assessment  Clinical Impression Statement Pt s/p R TKA presenting with increased R knee pain, decreased R knee ROM and R LE strength. Patient to benefit from  HHPT upon d/c to address above defecits. Patient demonstrates good potential to return home safely with spouse, recommended DME, and  HHPT.  PT Recommendation/Assessment Patient needs continued PT services  PT Problem List Decreased strength;Decreased range of motion;Decreased activity tolerance;Decreased balance;Decreased mobility  Barriers to Discharge None  PT Therapy Diagnosis  Difficulty walking;Abnormality of gait;Generalized weakness;Acute pain  PT Plan  PT Frequency 7X/week  PT Treatment/Interventions Gait training;Stair training;Functional mobility training;Therapeutic activities;Therapeutic exercise;DME instruction  PT Recommendation  Follow Up Recommendations Home health PT;Supervision - Intermittent  Equipment Recommended None recommended by PT (equip already delivered)  Individuals Consulted  Consulted and Agree with Results and Recommendations Patient  Acute Rehab PT Goals  PT Goal Formulation With patient  Time For Goal Achievement 04/21/12  Potential to Achieve Goals Good  Pt will go Supine/Side to Sit with modified independence;with HOB 0 degrees  PT Goal: Supine/Side to Sit - Progress Goal set today  Pt will go Sit to Stand with modified independence (up to RW.)  PT Goal: Sit to Stand - Progress Goal set today  Pt will Ambulate >150 feet;with modified independence;with rolling walker  PT Goal: Ambulate - Progress Goal set today  Pt will Go Up / Down Stairs 3-5 stairs;with rail(s) (and HHA.)  PT Goal: Up/Down  Stairs - Progress Goal set today  Pt will Perform Home Exercise Program Independently  PT Goal: Perform Home Exercise Program - Progress Goal set today  Written Expression  Dominant Hand Right     Pain: 8/10 R knee pain s/p PT  Patient assisted to bathroom with assist.. Patient I with tolieting.  Lewis Shock, PT, DPT Pager #: [phone] Office #: [phone]

## 2012-04-14 NOTE — Progress Notes (Signed)
Clinical Social Work  CSW spoke with PA after PA met with patient and family. CSW had received a consult order for SNF. Per PA, patient has decided to return home with husband and HH. CSW informed CM who was already aware of patient needs. CSW is signing off but can be consulted if needed.  [place], [place] [phone] (Coverage for Lovette Cliche)

## 2012-04-14 NOTE — Progress Notes (Signed)
Physical Therapy Treatment Note   04/14/12 1419  PT Visit Information  Last PT Received On 04/14/12  Assistance Needed +1  PT Time Calculation  PT Start Time 1419  PT Stop Time 1437  PT Time Calculation (min) 18 min  Subjective Data  Subjective Pt received supine in bed with report "My knee is so sore." Patient with report of ambulating to/from bathroom x4 in the last 2 hours. Patient requested to only do ther ex.  Precautions  Precautions Knee  Knee Immobilizer - Right On except when in CPM  Restrictions  Weight Bearing Restrictions Yes  RLE Weight Bearing WBAT  Other Position/Activity Restrictions WBAT  Cognition  Overall Cognitive Status Appears within functional limits for tasks assessed/performed  Arousal/Alertness Awake/alert  Orientation Level Appears intact for tasks assessed  Behavior During Session [facility] for tasks performed  Exercises  Exercises Total Joint  Total Joint Exercises  Ankle Circles/Pumps AROM;Both;20 reps;Supine  Quad Sets AROM;Right;15 reps;Supine (5 sec hold)  Short Arc Quad AROM;Right;10 reps;Supine  Heel Slides AAROM;Right;15 reps;Supine  Hip ABduction/ADduction AAROM;Right;10 reps;Supine  PT - End of Session  Equipment Utilized During Treatment Gait belt  Activity Tolerance Patient limited by pain;Patient limited by fatigue  Patient left in CPM;in bed;with call bell/phone within reach (CPM set 0-60, RN notified)  PT - Assessment/Plan  Comments on Treatment Session Pt limited to ther ex in bed this session. Patient compliant with HEP.  PT Plan Discharge plan remains appropriate;Frequency remains appropriate  PT Frequency 7X/week  Follow Up Recommendations Home health PT;Supervision - Intermittent  Equipment Recommended None recommended by PT  Acute Rehab PT Goals  Time For Goal Achievement 04/21/12  Potential to Achieve Goals Good  PT Goal: Perform Home Exercise Program - Progress Met     Pain: 8/10 R knee pain  Lewis Shock, PT,  DPT Pager #: [phone] Office #: [phone]

## 2012-04-14 NOTE — Care Management Note (Signed)
    Page 1 of 1   04/14/2012     11:18:56 AM   CARE MANAGEMENT NOTE 04/14/2012  Patient:  Christine [name], Christine [name]   Account Number:  [id number]  Date Initiated:  04/14/2012  Documentation initiated by:  Anette Guarneri  Subjective/Objective Assessment:   POD#1 s/p Right TKA  will need DME/HH services     Action/Plan:   per patient she has already arranged for 24/7 assistance after d/c home  DME already delivered.   Anticipated DC Date:  04/18/2012   Anticipated DC Plan:  HOME W HOME HEALTH SERVICES      DC Planning Services  CM consult      Choice offered to / List presented to:  [name]        HH arranged  HH-1 RN  HH-2 PT      Status of service:  Completed, signed off Medicare Important Message given?  NO (If response is "NO", the following Medicare IM given date fields will be blank) Date Medicare IM given:   Date Additional Medicare IM given:    Discharge Disposition:  HOME W HOME HEALTH SERVICES  Per UR Regulation:  Reviewed for med. necessity/level of care/duration of stay  If discussed at Long Length of Stay Meetings, dates discussed:    Comments:  04/14/12  10:32 Anette Guarneri RN/CM spoke with patient, per [name] she has arranged for 24/7 assistance for 1week home and husband will be in and out the 2nd week.  She perfers to d/c home, DME has already been delivered. Patient states that she was told by her MD's office that she will need [facility] for INR checks, Patient will also need HHPT.

## 2012-04-15 LAB — CBC
HCT: 26.1 % — ABNORMAL LOW (ref 36.0–46.0)
MCH: 31.1 pg (ref 26.0–34.0)
MCHC: 33.7 g/dL (ref 30.0–36.0)
MCV: 92.2 fL (ref 78.0–100.0)
RDW: 13.1 % (ref 11.5–15.5)

## 2012-04-15 LAB — BASIC METABOLIC PANEL
BUN: 5 mg/dL — ABNORMAL LOW (ref 6–23)
Calcium: 8.4 mg/dL (ref 8.4–10.5)
Creatinine, Ser: 0.64 mg/dL (ref 0.50–1.10)
GFR calc Af Amer: >90 mL/min (ref >90)
GFR calc non Af Amer: >90 mL/min (ref >90)
Potassium: 3.3 mEq/L — ABNORMAL LOW (ref 3.5–5.1)

## 2012-04-15 LAB — PROTIME-INR: INR: 1.11 (ref 0.00–1.49)

## 2012-04-15 LAB — URINALYSIS, ROUTINE W REFLEX MICROSCOPIC
Bilirubin Urine: NEGATIVE
Glucose, UA: NEGATIVE mg/dL
Hgb urine dipstick: NEGATIVE
Ketones, ur: NEGATIVE mg/dL
Protein, ur: NEGATIVE mg/dL

## 2012-04-15 MED ORDER — WARFARIN SODIUM 7.5 MG PO TABS
7.5000 mg | ORAL_TABLET | Freq: Once | ORAL | Status: DC
  Filled 2012-04-15: qty 1

## 2012-04-15 MED ORDER — OXYCODONE HCL 5 MG PO TABS: 5.0000 mg | ORAL_TABLET | ORAL | Status: DC | PRN

## 2012-04-15 MED ORDER — OXYCODONE-ACETAMINOPHEN 10-325 MG PO TABS: 1.0000 | ORAL_TABLET | ORAL | Status: AC | PRN

## 2012-04-15 MED ORDER — POTASSIUM CHLORIDE 20 MEQ PO PACK: 40.0000 meq | PACK | Freq: Once | ORAL | Status: DC

## 2012-04-15 MED ORDER — METHOCARBAMOL 500 MG PO TABS: 500.0000 mg | ORAL_TABLET | Freq: Four times a day (QID) | ORAL | Status: AC | PRN

## 2012-04-15 MED ORDER — OXYCODONE HCL 5 MG PO TABS
5.0000 mg | ORAL_TABLET | ORAL | Status: DC | PRN
  Administered 2012-04-15 (×2): 10 mg via ORAL
  Filled 2012-04-15 (×2): qty 2

## 2012-04-15 MED ORDER — OXYCODONE-ACETAMINOPHEN 5-325 MG PO TABS
1.0000 | ORAL_TABLET | ORAL | Status: DC | PRN
  Administered 2012-04-15 (×2): 2 via ORAL
  Filled 2012-04-15 (×3): qty 2

## 2012-04-15 MED ORDER — ENOXAPARIN SODIUM 30 MG/0.3ML ~~LOC~~ SOLN: 30.0000 mg | Freq: Two times a day (BID) | SUBCUTANEOUS | Status: DC

## 2012-04-15 MED ORDER — PROMETHAZINE HCL 12.5 MG PO TABS: 12.5000 mg | ORAL_TABLET | Freq: Four times a day (QID) | ORAL | Status: DC | PRN

## 2012-04-15 MED ORDER — WARFARIN SODIUM 5 MG PO TABS: 5.0000 mg | ORAL_TABLET | Freq: Every day | ORAL | Status: DC

## 2012-04-15 MED ORDER — POTASSIUM CHLORIDE CRYS ER 20 MEQ PO TBCR
40.0000 meq | EXTENDED_RELEASE_TABLET | Freq: Once | ORAL | Status: AC
  Administered 2012-04-15: 40 meq via ORAL
  Filled 2012-04-15: qty 2

## 2012-04-15 MED ORDER — BISACODYL 10 MG RE SUPP
10.0000 mg | Freq: Once | RECTAL | Status: AC
  Administered 2012-04-15: 10 mg via RECTAL
  Filled 2012-04-15: qty 1

## 2012-04-15 NOTE — Progress Notes (Signed)
Subjective: C/o pain right knee.  Denies fatigue, weakness.  "feel like i'm getting a bladder infection".  Denies abd pain.  No bm yet.    Objective: Vital signs in last 24 hours: Temp:  [98.3 F (36.8 C)-99.7 F (37.6 C)] 99.7 F (37.6 C) (05/16 2158) Pulse Rate:  [88-92] 92  (05/16 2158) Resp:  [16] 16  (05/16 2158) BP: (108-119)/(66-81) 119/81 mmHg (05/16 2158) SpO2:  [97 %-100 %] 97 % (05/16 2158) Weight:  [79 kg (174 lb 2.6 oz)] 79 kg (174 lb 2.6 oz) (05/16 1025)  Intake/Output from previous day: 05/16 0701 - 05/17 0700 In: 600 [P.O.:600] Out: -  Intake/Output this shift:     Basename 04/15/12 0542 04/14/12 0530  HGB 8.8* 9.5*    Basename 04/15/12 0542 04/14/12 0530  WBC 7.6 10.5  RBC 2.83* 3.09*  HCT 26.1* 28.4*  PLT 236 255    Basename 04/15/12 0542 04/14/12 0530  NA 138 140  K 3.3* 4.2  CL 102 105  CO2 29 25  BUN 5* 6  CREATININE 0.64 0.57  GLUCOSE 95 115*  CALCIUM 8.4 8.3*    Basename 04/15/12 0542 04/14/12 0530  LABPT -- --  INR 1.11 1.09    Exam:  Wound loods good.  Staples intact.  No drainage or signs of infection.  Calf nt, nvi.    Assessment/Plan: -ua and urine c&s.   -ABLA asymptomatic.  Will recheck H/H this afternoon.  ? Transfusion prbc's. -try percocet 10/325.  D/c pca -kcl po x 1 dose.   -possible d/c home if labs ok and does well with PT.  Zonia Kief M 04/15/2012, 8:43 AM

## 2012-04-15 NOTE — Progress Notes (Signed)
PT Cancel Note:   Pt politely refusing PT session this PM.  States she is going home today & that she feels comfortable with mobility.    Verdell Face, [place] [phone] 04/15/2012

## 2012-04-15 NOTE — Progress Notes (Signed)
ANTICOAGULATION CONSULT NOTE - Follow Up Consult  Pharmacy Consult for Coumadin Indication: VTE prophylaxis  Allergies  Allergen Reactions  . Other Nausea And Vomiting    Allergic to pain medications.   . Sulfa Antibiotics Rash    Patient Measurements: Height: 5\' 5"  (165.1 cm) Weight: 174 lb 2.6 oz (79 kg) (est) IBW/kg (Calculated) : 57  Heparin Dosing Weight:   Vital Signs:    Labs:  Basename 04/15/12 0921 04/15/12 0542 04/14/12 0530  HGB 9.1* 8.8* --  HCT 27.1* 26.1* 28.4*  PLT -- 236 255  APTT -- -- --  LABPROT -- 14.5 14.3  INR -- 1.11 1.09  HEPARINUNFRC -- -- --  CREATININE -- 0.64 0.57  CKTOTAL -- -- --  CKMB -- -- --  TROPONINI -- -- --    Estimated Creatinine Clearance: 76.7 ml/min (by C-G formula based on Cr of 0.64).   Medications:  Lovenox 30mg  SQ q12h  Assessment: 60yof on Coumadin for VTE prophylaxis s/p TKA. INR (1.11) is subtherapeutic and not responding appropriately to 5mg  doses. Patient is also receiving Lovenox 30mg  q12h until INR >1.8 per MD orders. - H/H and Plts trending down - No significant bleeding reported  Goal of Therapy:  INR 2-3 Monitor platelets by anticoagulation protocol: Yes   Plan:  1. Coumadin 7.5mg  po x 1 today 2. Follow-up AM INR   Cleon Dew [phone] 04/15/2012,10:26 AM

## 2012-04-15 NOTE — Progress Notes (Signed)
Pt is D/C to home. Scripts and instructions given. Lovenox teaching done with Pt and husband. They both verbalized understanding on how to admin injection.

## 2012-04-15 NOTE — Progress Notes (Signed)
PT Progress Note:     04/15/12 1100  PT Visit Information  Last PT Received On 04/15/12  Assistance Needed +1  PT Time Calculation  PT Start Time 0827  PT Stop Time 0901  PT Time Calculation (min) 34 min  Precautions  Precautions Knee  Required Braces or Orthoses Knee Immobilizer - Right  Knee Immobilizer - Right On except when in CPM  Restrictions  Weight Bearing Restrictions Yes  RLE Weight Bearing WBAT  Cognition  Overall Cognitive Status Appears within functional limits for tasks assessed/performed  Arousal/Alertness Awake/alert  Orientation Level Appears intact for tasks assessed  Behavior During Session [facility] for tasks performed  Bed Mobility  Bed Mobility Supine to Sit;Sit to Supine  Supine to Sit 4: Min guard;HOB flat  Sit to Supine 4: Min assist;HOB flat  Details for Bed Mobility Assistance (A) for RLE back into bed.  Cues for hooking technique to (A) with moving RLE.    Transfers  Transfers Sit to Stand;Stand to Sit  Sit to Stand 4: Min guard;With armrests;From bed;Other (comment) (ortho gym mat table)  Stand to Sit 4: Min guard;With upper extremity assist;To bed;To chair/3-in-1;Other (comment) (ortho gym mat table)  Details for Transfer Assistance Guarding for safety.  Pt uses proper technique with transfer  Ambulation/Gait  Ambulation/Gait Assistance 4: Min guard  Ambulation Distance (Feet) 60 Feet (2x's. )  Assistive device Rolling walker  Ambulation/Gait Assistance Details Min Cueing for sequencing & increased step length.   Gait Pattern Step-to pattern;Decreased stance time - right;Decreased step length - left  Stairs Yes  Stairs Assistance 4: Min assist  Stairs Assistance Details (indicate cue type and reason) Pt used rail on Lt side & HHA on Rt side for support/stability.  Pt only requiring minor (A).  Cues for sequencing & technique.    Stair Management Technique One rail Left;Step to pattern;Forwards;Other (comment) (HHA Rt side)  Number of Stairs 4     Wheelchair Mobility  Wheelchair Mobility No  Balance  Balance Assessed No  Exercises  Exercises Total Joint  Total Joint Exercises  Ankle Circles/Pumps AROM;Both;20 reps;Supine  Quad Sets AROM;Right;15 reps;Supine  Straight Leg Raises AAROM;Strengthening;Right;10 reps;Supine  PT - End of Session  Equipment Utilized During Treatment Gait belt;Right knee immobilizer  Activity Tolerance Patient tolerated treatment well  Patient left in chair;with call bell/phone within reach  PT - Assessment/Plan  Comments on Treatment Session Pt progressing well with PT goals & mobility.  Initiated stair training this session- pt did well.  Pt reports feeling somewhat "tired & weak" but did well.    PT Plan Discharge plan remains appropriate;Frequency remains appropriate  PT Frequency 7X/week  Follow Up Recommendations Home health PT;Supervision - Intermittent  Equipment Recommended None recommended by PT  Acute Rehab PT Goals  PT Goal: Supine/Side to Sit - Progress Progressing toward goal  PT Goal: Sit to Stand - Progress Progressing toward goal  PT Goal: Ambulate - Progress Progressing toward goal  PT Goal: Up/Down Stairs - Progress Progressing toward goal  PT Goal: Perform Home Exercise Program - Progress Progressing toward goal     Verdell Face, [place] [phone] 04/15/2012

## 2012-04-20 NOTE — Discharge Summary (Signed)
  ABBREVIATED DISCHARGE SUMMARY      DATE OF HOSPITALIZATION:  13 Apr 2012  REASON FOR HOSPITALIZATION:  61 yo wf with end stage djd right knee and pain.      SIGNIFICANT FINDINGS:  DJD  OPERATION:   Right total knee replacement  FINAL DIAGNOSIS:  same  SECONDARY DIAGNOSIS: none  CONSULTANTS:  none  DISCHARGE CONDITION:  STABLE  DISCHARGED TO:  HOME

## 2012-07-14 ENCOUNTER — Encounter (HOSPITAL_COMMUNITY): Payer: Self-pay

## 2012-07-18 NOTE — Pre-Procedure Instructions (Signed)
19 Christine [name]  07/18/2012   Your procedure is scheduled on:  Wednesday July 27, 2012.  Report to [facility] at 0930 AM.  Call this number if you have problems the morning of surgery: [phone]   Remember:   Do not eat food or drink:After Midnight.    Take these medicines the morning of surgery with A SIP OF WATER: Duloxetine (Cymbalta), and Estrogen (Premarin)   Do not wear jewelry, make-up or nail polish.  Do not wear lotions, powders, or perfumes.  Do not shave 48 hours prior to surgery.   Do not bring valuables to the hospital.  Contacts, dentures or bridgework may not be worn into surgery.  Leave suitcase in the car. After surgery it may be brought to your room.  For patients admitted to the hospital, checkout time is 11:00 AM the day of discharge.   Patients discharged the day of surgery will not be allowed to drive home.  Name and phone number of your driver:   Special Instructions: CHG Shower Use Special Wash: 1/2 bottle night before surgery and 1/2 bottle morning of surgery.   Please read over the following fact sheets that you were given: Pain Booklet, Coughing and Deep Breathing, Blood Transfusion Information, Total Joint Packet, MRSA Information and Surgical Site Infection Prevention

## 2012-07-19 ENCOUNTER — Encounter (HOSPITAL_COMMUNITY)
Admission: RE | Admit: 2012-07-19 | Discharge: 2012-07-19 | Disposition: A | Discharge status: Home / Self Care | Payer: 59 | Source: Ambulatory Visit | Attending: Orthopedic Surgery | Admitting: Orthopedic Surgery

## 2012-07-19 ENCOUNTER — Encounter (HOSPITAL_COMMUNITY): Payer: Self-pay

## 2012-07-19 HISTORY — DX: Bronchitis, not specified as acute or chronic: J40

## 2012-07-19 LAB — URINALYSIS, ROUTINE W REFLEX MICROSCOPIC
Bilirubin Urine: NEGATIVE
Nitrite: NEGATIVE
Protein, ur: NEGATIVE mg/dL
Specific Gravity, Urine: 1.022 (ref 1.005–1.030)
Urobilinogen, UA: 0.2 mg/dL (ref 0.0–1.0)

## 2012-07-19 LAB — APTT: aPTT: 26 seconds (ref 24–37)

## 2012-07-19 LAB — TYPE AND SCREEN: Antibody Screen: NEGATIVE

## 2012-07-19 LAB — COMPREHENSIVE METABOLIC PANEL
ALT: 25 U/L (ref 0–35)
Albumin: 3.3 g/dL — ABNORMAL LOW (ref 3.5–5.2)
Alkaline Phosphatase: 91 U/L (ref 39–117)
BUN: 10 mg/dL (ref 6–23)
Potassium: 4 mEq/L (ref 3.5–5.1)
Sodium: 141 mEq/L (ref 135–145)
Total Protein: 6.7 g/dL (ref 6.0–8.3)

## 2012-07-19 LAB — SURGICAL PCR SCREEN: Staphylococcus aureus: NEGATIVE

## 2012-07-19 LAB — PROTIME-INR: Prothrombin Time: 12.6 seconds (ref 11.6–15.2)

## 2012-07-19 LAB — CBC
MCHC: 33.2 g/dL (ref 30.0–36.0)
RDW: 12.6 % (ref 11.5–15.5)

## 2012-07-19 NOTE — Progress Notes (Signed)
Patient informed Nurse that she had a stress test with [facility] in 2013. Will request records. Patient denied having a cardiac cath or sleep study.

## 2012-07-19 NOTE — Progress Notes (Signed)
Patient questioned Nurse about consent form and informed Nurse that Dr. Eulah Pont was going to do an additional procedure while performing the left hip replacement. Patient called Dr. Greig Right office and left voicemail. Office staff returned patients call and spoke with Nurse and Nurse informed office staff that the consent form would need the additional procedure added to consent form. Office staff verbalized understanding and stated that they would change consent form. No new orders for consent were noted. Nurse explained to patient that if additional information was added to the consent form that she could sign it the day of surgery. Patient verbalized understanding.

## 2012-07-26 MED ORDER — CEFAZOLIN SODIUM-DEXTROSE 2-3 GM-% IV SOLR
2.0000 g | INTRAVENOUS | Status: AC
  Administered 2012-07-27: 2 g via INTRAVENOUS
  Filled 2012-07-26 (×2): qty 50

## 2012-07-26 NOTE — Progress Notes (Signed)
Patient notified to arrival at 06:30. Patient verbalized understanding

## 2012-07-26 NOTE — H&P (Signed)
  [facility] [address]   [address] [place], [place] [address] [phone] A Division of [facility]  Loreta Ave, M.D.     Robert A. Thurston Hole, M.D.     Lunette Stands, M.D. Eulas Post, M.D.    Buford Dresser, M.D. Estell Harpin, M.D. Ralene Cork, D.O.          Genene Churn. Barry Dienes, PA-C            Kirstin A. Shepperson, PA-C [place], OPA-C   RE: Christine, [name]                                [id number]      DOB: 08/23/1951 PROGRESS NOTE: 07-15-12 Chief complaint: Left hip pain.  History of present illness: 48 one year-old white female with a history of end stage DJD, left hip, and chronic pain.  Returns.  States that hip symptoms unchanged from previous visit.  She is wanting to proceed with total hip replacement as scheduled.  Patient is status post right total knee replacement on March 14, 2012 and since her surgery she has been complaining of some feeling of fatigue.  Primary care/gastroenterologist, Dr. Danise Edge, referred her to cardiology and she had stress test and echocardiogram last month.  Patient states that this workup was negative.  She has also had lab work done, but we do not have a copy of this for our review.  No complaints of chest pain or shortness of breath.   Current medications: Mobic, Premarin, Cymbalta, Flexeril, fish oil, multivitamin and Vitamin D. Allergies: Codeine, Percocet, Vicodin and Morphine cause nausea.  Sulfa causes a rash.   Past medical/surgical history: Right total knee replacement in April of 2013, C-section, lumbar fusion, cholecystectomy, hysterectomy, bunion surgery, tonsillectomy, appendectomy, left hand surgery, history of c-diff and depression.   Review of systems: Patient denies lightheadedness, dizziness, fevers or chills, cardiac, pulmonary, GI or GU issues. Family history: Positive for heart disease, diabetes and cancer. Social history: He quit smoking  in 1975, admits occasional alcohol consumption.  Patient is married.         EXAMINATION: Temperature: 98.9.  Height: 5?4.  Weight: 165 pounds.  Respirations: 16.  Blood pressure: 129/88.  Pulse: 68.  Pleasant white female, alert and oriented x 3 and in no acute distress.  No increase in respiratory effort.  Gait is antalgic.  Head is normocephalic, a traumatic.  PERRLA, EOMI.  Cervical spine unremarkable.  Lungs: CTA bilaterally.  No wheezes.  Heart: RRR.  S1 and S2.  No murmurs.  Abdomen: Round and non-distended.  NBS x 4.  Soft and non-tender.  Left hip: Decreased range of motion with pain.  Bilateral calves non-tender.  Neurovascularly intact.  Skin warm and dry.           X-RAYS: Left hip shows end stage DJD with periarticular spurs.    IMPRESSION: End stage DJD, left hip, and chronic pain.  Failed conservative treatment.   PLAN: We will proceed with left total hip replacement as scheduled.  Surgical procedure, along with potential rehab/recovery time discussed.  All questions answered.    Loreta Ave, M.D.   Electronically verified by Loreta Ave, M.D. DFM(JMO):jjh D 07-15-12 T 07-18-12

## 2012-07-27 ENCOUNTER — Ambulatory Visit (HOSPITAL_COMMUNITY): Payer: 59 | Admitting: Anesthesiology

## 2012-07-27 ENCOUNTER — Encounter (HOSPITAL_COMMUNITY): Payer: Self-pay | Admitting: Anesthesiology

## 2012-07-27 ENCOUNTER — Encounter (HOSPITAL_COMMUNITY): Payer: Self-pay | Admitting: General Practice

## 2012-07-27 ENCOUNTER — Inpatient Hospital Stay (HOSPITAL_COMMUNITY)
Admission: RE | Admit: 2012-07-27 | Discharge: 2012-07-30 | DRG: 470 | Disposition: A | Discharge status: Home / Self Care | Payer: 59 | Source: Ambulatory Visit | Attending: Orthopedic Surgery | Admitting: Orthopedic Surgery

## 2012-07-27 ENCOUNTER — Encounter (HOSPITAL_COMMUNITY)
Admission: RE | Disposition: A | Discharge status: Home / Self Care | Payer: Self-pay | Source: Ambulatory Visit | Attending: Orthopedic Surgery

## 2012-07-27 ENCOUNTER — Encounter (HOSPITAL_COMMUNITY): Payer: Self-pay | Admitting: *Deleted

## 2012-07-27 ENCOUNTER — Inpatient Hospital Stay (HOSPITAL_COMMUNITY): Payer: 59

## 2012-07-27 DIAGNOSIS — Z882 Allergy status to sulfonamides status: Secondary | ICD-10-CM

## 2012-07-27 DIAGNOSIS — Z96649 Presence of unspecified artificial hip joint: Secondary | ICD-10-CM

## 2012-07-27 DIAGNOSIS — M169 Osteoarthritis of hip, unspecified: Principal | ICD-10-CM | POA: Diagnosis present

## 2012-07-27 DIAGNOSIS — M76899 Other specified enthesopathies of unspecified lower limb, excluding foot: Secondary | ICD-10-CM | POA: Diagnosis present

## 2012-07-27 DIAGNOSIS — M1610 Unilateral primary osteoarthritis, unspecified hip: Principal | ICD-10-CM | POA: Diagnosis present

## 2012-07-27 DIAGNOSIS — Z87891 Personal history of nicotine dependence: Secondary | ICD-10-CM

## 2012-07-27 DIAGNOSIS — G8929 Other chronic pain: Secondary | ICD-10-CM

## 2012-07-27 DIAGNOSIS — Z8249 Family history of ischemic heart disease and other diseases of the circulatory system: Secondary | ICD-10-CM

## 2012-07-27 DIAGNOSIS — Z833 Family history of diabetes mellitus: Secondary | ICD-10-CM

## 2012-07-27 DIAGNOSIS — M545 Low back pain, unspecified: Secondary | ICD-10-CM

## 2012-07-27 HISTORY — DX: Other chronic pain: G89.29

## 2012-07-27 HISTORY — DX: Pure hypercholesterolemia, unspecified: E78.00

## 2012-07-27 HISTORY — DX: Other specified postprocedural states: R11.2

## 2012-07-27 HISTORY — PX: TOTAL HIP ARTHROPLASTY: SHX124

## 2012-07-27 HISTORY — DX: Low back pain: M54.5

## 2012-07-27 HISTORY — DX: Low back pain, unspecified: M54.50

## 2012-07-27 HISTORY — DX: Other specified postprocedural states: Z98.890

## 2012-07-27 SURGERY — ARTHROPLASTY, HIP, TOTAL,POSTERIOR APPROACH
Anesthesia: General | Site: Hip | Laterality: Left | Wound class: Clean

## 2012-07-27 MED ORDER — MORPHINE SULFATE (PF) 1 MG/ML IV SOLN
INTRAVENOUS | Status: DC
  Administered 2012-07-27: 10 mg via INTRAVENOUS
  Administered 2012-07-27: 19:00:00 via INTRAVENOUS
  Administered 2012-07-27: 4 mg via INTRAVENOUS
  Administered 2012-07-27: 3 mg via INTRAVENOUS
  Administered 2012-07-28: 10 mg via INTRAVENOUS
  Administered 2012-07-28: 06:00:00 via INTRAVENOUS
  Filled 2012-07-27 (×2): qty 25

## 2012-07-27 MED ORDER — ACETAMINOPHEN 325 MG PO TABS
650.0000 mg | ORAL_TABLET | Freq: Four times a day (QID) | ORAL | Status: DC | PRN
  Administered 2012-07-28 – 2012-07-29 (×2): 650 mg via ORAL
  Filled 2012-07-27 (×2): qty 2

## 2012-07-27 MED ORDER — METOCLOPRAMIDE HCL 5 MG/ML IJ SOLN: 5.0000 mg | Freq: Three times a day (TID) | INTRAMUSCULAR | Status: DC | PRN

## 2012-07-27 MED ORDER — NALOXONE HCL 0.4 MG/ML IJ SOLN: 0.4000 mg | INTRAMUSCULAR | Status: DC | PRN

## 2012-07-27 MED ORDER — METOCLOPRAMIDE HCL 10 MG PO TABS: 5.0000 mg | ORAL_TABLET | Freq: Three times a day (TID) | ORAL | Status: DC | PRN

## 2012-07-27 MED ORDER — METHOCARBAMOL 100 MG/ML IJ SOLN
500.0000 mg | Freq: Four times a day (QID) | INTRAVENOUS | Status: DC | PRN
  Filled 2012-07-27: qty 5

## 2012-07-27 MED ORDER — ONDANSETRON HCL 4 MG/2ML IJ SOLN: 4.0000 mg | Freq: Four times a day (QID) | INTRAMUSCULAR | Status: DC | PRN

## 2012-07-27 MED ORDER — LACTATED RINGERS IV SOLN
INTRAVENOUS | Status: DC | PRN
  Administered 2012-07-27 (×2): via INTRAVENOUS

## 2012-07-27 MED ORDER — NEOSTIGMINE METHYLSULFATE 1 MG/ML IJ SOLN
INTRAMUSCULAR | Status: DC | PRN
  Administered 2012-07-27: 3 mg via INTRAVENOUS

## 2012-07-27 MED ORDER — DEXAMETHASONE SODIUM PHOSPHATE 4 MG/ML IJ SOLN
INTRAMUSCULAR | Status: DC | PRN
  Administered 2012-07-27: 8 mg via INTRAVENOUS

## 2012-07-27 MED ORDER — DULOXETINE HCL 60 MG PO CPEP
60.0000 mg | ORAL_CAPSULE | Freq: Every day | ORAL | Status: DC
  Administered 2012-07-28 – 2012-07-29 (×2): 60 mg via ORAL
  Filled 2012-07-27 (×3): qty 1

## 2012-07-27 MED ORDER — ACETAMINOPHEN 10 MG/ML IV SOLN
INTRAVENOUS | Status: AC
  Filled 2012-07-27: qty 100

## 2012-07-27 MED ORDER — SODIUM CHLORIDE 0.9 % IR SOLN
Status: DC | PRN
  Administered 2012-07-27: 1000 mL

## 2012-07-27 MED ORDER — FENTANYL CITRATE 0.05 MG/ML IJ SOLN
INTRAMUSCULAR | Status: AC
  Filled 2012-07-27: qty 2

## 2012-07-27 MED ORDER — MIDAZOLAM HCL 2 MG/2ML IJ SOLN: 1.0000 mg | INTRAMUSCULAR | Status: DC | PRN

## 2012-07-27 MED ORDER — PATIENT'S GUIDE TO USING COUMADIN BOOK
Freq: Once | Status: AC
  Administered 2012-07-27: 16:00:00
  Filled 2012-07-27: qty 1

## 2012-07-27 MED ORDER — POTASSIUM CHLORIDE IN NACL 20-0.9 MEQ/L-% IV SOLN
INTRAVENOUS | Status: DC
  Administered 2012-07-27: 18:00:00 via INTRAVENOUS
  Filled 2012-07-27 (×6): qty 1000

## 2012-07-27 MED ORDER — WARFARIN VIDEO: Freq: Once | Status: DC

## 2012-07-27 MED ORDER — SENNOSIDES-DOCUSATE SODIUM 8.6-50 MG PO TABS: 1.0000 | ORAL_TABLET | Freq: Every evening | ORAL | Status: DC | PRN

## 2012-07-27 MED ORDER — GLYCOPYRROLATE 0.2 MG/ML IJ SOLN
INTRAMUSCULAR | Status: DC | PRN
  Administered 2012-07-27: 0.4 mg via INTRAVENOUS

## 2012-07-27 MED ORDER — CEFAZOLIN SODIUM 1-5 GM-% IV SOLN
1.0000 g | Freq: Three times a day (TID) | INTRAVENOUS | Status: AC
  Administered 2012-07-27 (×2): 1 g via INTRAVENOUS
  Filled 2012-07-27 (×2): qty 50

## 2012-07-27 MED ORDER — ENOXAPARIN SODIUM 40 MG/0.4ML ~~LOC~~ SOLN
40.0000 mg | SUBCUTANEOUS | Status: DC
  Administered 2012-07-28 – 2012-07-30 (×3): 40 mg via SUBCUTANEOUS
  Filled 2012-07-27 (×4): qty 0.4

## 2012-07-27 MED ORDER — DIPHENHYDRAMINE HCL 50 MG/ML IJ SOLN
12.5000 mg | Freq: Four times a day (QID) | INTRAMUSCULAR | Status: DC | PRN
  Administered 2012-07-27 (×3): 12.5 mg via INTRAVENOUS
  Administered 2012-07-28: 25 mg via INTRAVENOUS
  Administered 2012-07-28: 12.5 mg via INTRAVENOUS
  Filled 2012-07-27 (×3): qty 1

## 2012-07-27 MED ORDER — MENTHOL 3 MG MT LOZG: 1.0000 | LOZENGE | OROMUCOSAL | Status: DC | PRN

## 2012-07-27 MED ORDER — ROCURONIUM BROMIDE 100 MG/10ML IV SOLN
INTRAVENOUS | Status: DC | PRN
  Administered 2012-07-27: 50 mg via INTRAVENOUS

## 2012-07-27 MED ORDER — FENTANYL CITRATE 0.05 MG/ML IJ SOLN
25.0000 ug | INTRAMUSCULAR | Status: DC | PRN
  Administered 2012-07-27 (×2): 50 ug via INTRAVENOUS

## 2012-07-27 MED ORDER — MORPHINE SULFATE (PF) 1 MG/ML IV SOLN
INTRAVENOUS | Status: AC
  Administered 2012-07-27: 11:00:00
  Filled 2012-07-27: qty 25

## 2012-07-27 MED ORDER — OXYCODONE-ACETAMINOPHEN 5-325 MG PO TABS
ORAL_TABLET | ORAL | Status: AC
  Filled 2012-07-27: qty 2

## 2012-07-27 MED ORDER — PROMETHAZINE HCL 25 MG/ML IJ SOLN
INTRAMUSCULAR | Status: AC
  Filled 2012-07-27: qty 1

## 2012-07-27 MED ORDER — DIPHENHYDRAMINE HCL 50 MG/ML IJ SOLN: 12.5000 mg | Freq: Once | INTRAMUSCULAR | Status: DC

## 2012-07-27 MED ORDER — ONDANSETRON HCL 4 MG PO TABS: 4.0000 mg | ORAL_TABLET | Freq: Four times a day (QID) | ORAL | Status: DC | PRN

## 2012-07-27 MED ORDER — ACETAMINOPHEN 650 MG RE SUPP: 650.0000 mg | Freq: Four times a day (QID) | RECTAL | Status: DC | PRN

## 2012-07-27 MED ORDER — DIPHENHYDRAMINE HCL 12.5 MG/5ML PO ELIX: 12.5000 mg | ORAL_SOLUTION | Freq: Four times a day (QID) | ORAL | Status: DC | PRN

## 2012-07-27 MED ORDER — PHENOL 1.4 % MT LIQD: 1.0000 | OROMUCOSAL | Status: DC | PRN

## 2012-07-27 MED ORDER — BUPIVACAINE HCL 0.5 % IJ SOLN
INTRAMUSCULAR | Status: DC | PRN
  Administered 2012-07-27: 20 mL

## 2012-07-27 MED ORDER — DIPHENHYDRAMINE HCL 50 MG/ML IJ SOLN
INTRAMUSCULAR | Status: AC
  Filled 2012-07-27: qty 1

## 2012-07-27 MED ORDER — WARFARIN SODIUM 5 MG PO TABS
5.0000 mg | ORAL_TABLET | Freq: Every day | ORAL | Status: DC
  Administered 2012-07-27 – 2012-07-29 (×3): 5 mg via ORAL
  Filled 2012-07-27 (×4): qty 1

## 2012-07-27 MED ORDER — SODIUM CHLORIDE 0.9 % IJ SOLN: 9.0000 mL | INTRAMUSCULAR | Status: DC | PRN

## 2012-07-27 MED ORDER — OXYCODONE-ACETAMINOPHEN 5-325 MG PO TABS
1.0000 | ORAL_TABLET | ORAL | Status: DC | PRN
  Administered 2012-07-27: 2 via ORAL

## 2012-07-27 MED ORDER — METHOCARBAMOL 500 MG PO TABS
ORAL_TABLET | ORAL | Status: AC
  Filled 2012-07-27: qty 1

## 2012-07-27 MED ORDER — LIDOCAINE HCL (CARDIAC) 20 MG/ML IV SOLN
INTRAVENOUS | Status: DC | PRN
  Administered 2012-07-27: 100 mg via INTRAVENOUS

## 2012-07-27 MED ORDER — VITAMIN D3 25 MCG (1000 UNIT) PO TABS
2000.0000 [IU] | ORAL_TABLET | Freq: Every day | ORAL | Status: DC
  Administered 2012-07-27 – 2012-07-29 (×3): 2000 [IU] via ORAL
  Filled 2012-07-27 (×4): qty 2

## 2012-07-27 MED ORDER — FENTANYL CITRATE 0.05 MG/ML IJ SOLN
INTRAMUSCULAR | Status: DC | PRN
  Administered 2012-07-27: 100 ug via INTRAVENOUS
  Administered 2012-07-27 (×3): 50 ug via INTRAVENOUS
  Administered 2012-07-27 (×2): 100 ug via INTRAVENOUS

## 2012-07-27 MED ORDER — MIDAZOLAM HCL 5 MG/5ML IJ SOLN
INTRAMUSCULAR | Status: DC | PRN
  Administered 2012-07-27: 2 mg via INTRAVENOUS

## 2012-07-27 MED ORDER — FENTANYL CITRATE 0.05 MG/ML IJ SOLN: 50.0000 ug | INTRAMUSCULAR | Status: DC | PRN

## 2012-07-27 MED ORDER — ONDANSETRON HCL 4 MG/2ML IJ SOLN
INTRAMUSCULAR | Status: DC | PRN
  Administered 2012-07-27: 4 mg via INTRAVENOUS

## 2012-07-27 MED ORDER — ACETAMINOPHEN 10 MG/ML IV SOLN
INTRAVENOUS | Status: DC | PRN
  Administered 2012-07-27: 1000 mg via INTRAVENOUS

## 2012-07-27 MED ORDER — METHOCARBAMOL 500 MG PO TABS
500.0000 mg | ORAL_TABLET | Freq: Four times a day (QID) | ORAL | Status: DC | PRN
  Administered 2012-07-27 – 2012-07-29 (×7): 500 mg via ORAL
  Filled 2012-07-27 (×6): qty 1

## 2012-07-27 MED ORDER — PROMETHAZINE HCL 25 MG/ML IJ SOLN
6.2500 mg | INTRAMUSCULAR | Status: DC | PRN
  Administered 2012-07-27: 6.25 mg via INTRAVENOUS

## 2012-07-27 MED ORDER — PROPOFOL 10 MG/ML IV EMUL
INTRAVENOUS | Status: DC | PRN
  Administered 2012-07-27: 30 mg via INTRAVENOUS
  Administered 2012-07-27: 200 mg via INTRAVENOUS

## 2012-07-27 MED ORDER — WARFARIN - PHARMACIST DOSING INPATIENT
Freq: Every day | Status: DC
  Administered 2012-07-27: 18:00:00

## 2012-07-27 MED ORDER — DOCUSATE SODIUM 100 MG PO CAPS
100.0000 mg | ORAL_CAPSULE | Freq: Two times a day (BID) | ORAL | Status: DC
  Administered 2012-07-27 – 2012-07-29 (×5): 100 mg via ORAL
  Filled 2012-07-27 (×8): qty 1

## 2012-07-27 MED ORDER — BUPIVACAINE HCL (PF) 0.5 % IJ SOLN
INTRAMUSCULAR | Status: AC
  Filled 2012-07-27: qty 30

## 2012-07-27 MED ORDER — ALUM & MAG HYDROXIDE-SIMETH 200-200-20 MG/5ML PO SUSP: 30.0000 mL | ORAL | Status: DC | PRN

## 2012-07-27 SURGICAL SUPPLY — 67 items
BLADE SAW SAG 73X25 THK (BLADE) ×1
BLADE SAW SGTL 73X25 THK (BLADE) ×2 IMPLANT
BOOTCOVER CLEANROOM LRG (PROTECTIVE WEAR) ×6 IMPLANT
BRUSH FEMORAL CANAL (MISCELLANEOUS) IMPLANT
CLOTH BEACON ORANGE TIMEOUT ST (SAFETY) ×3 IMPLANT
COVER BACK TABLE 24X17X13 BIG (DRAPES) IMPLANT
COVER SURGICAL LIGHT HANDLE (MISCELLANEOUS) ×3 IMPLANT
DRAPE INCISE IOBAN 66X45 STRL (DRAPES) IMPLANT
DRAPE ORTHO SPLIT 77X108 STRL (DRAPES) ×6
DRAPE STERI IOBAN 125X83 (DRAPES) ×3 IMPLANT
DRAPE SURG ORHT 6 SPLT 77X108 (DRAPES) ×4 IMPLANT
DRAPE U-SHAPE 47X51 STRL (DRAPES) ×3 IMPLANT
DRILL BIT 7/64X5 (BIT) ×3 IMPLANT
DRSG PAD ABDOMINAL 8X10 ST (GAUZE/BANDAGES/DRESSINGS) ×4 IMPLANT
DURAPREP 26ML APPLICATOR (WOUND CARE) ×3 IMPLANT
ELECT BLADE 6.5 EXT (BLADE) IMPLANT
ELECT CAUTERY BLADE 6.4 (BLADE) ×3 IMPLANT
ELECT REM PT RETURN 9FT ADLT (ELECTROSURGICAL) ×3
ELECTRODE REM PT RTRN 9FT ADLT (ELECTROSURGICAL) ×2 IMPLANT
EVACUATOR 1/8 PVC DRAIN (DRAIN) IMPLANT
FACESHIELD LNG OPTICON STERILE (SAFETY) ×4 IMPLANT
GAUZE XEROFORM 5X9 LF (GAUZE/BANDAGES/DRESSINGS) ×3 IMPLANT
GLOVE BIOGEL PI IND STRL 8 (GLOVE) ×2 IMPLANT
GLOVE BIOGEL PI INDICATOR 8 (GLOVE) ×1
GLOVE ORTHO TXT STRL SZ7.5 (GLOVE) ×6 IMPLANT
GLOVE SURG ORTHO 8.0 STRL STRW (GLOVE) ×3 IMPLANT
GOWN PREVENTION PLUS XLARGE (GOWN DISPOSABLE) ×3 IMPLANT
GOWN STRL NON-REIN LRG LVL3 (GOWN DISPOSABLE) ×3 IMPLANT
GOWN STRL REIN 2XL XLG LVL4 (GOWN DISPOSABLE) ×3 IMPLANT
HANDPIECE INTERPULSE COAX TIP (DISPOSABLE)
KIT BASIN OR (CUSTOM PROCEDURE TRAY) ×3 IMPLANT
KIT ROOM TURNOVER OR (KITS) ×3 IMPLANT
MANIFOLD NEPTUNE II (INSTRUMENTS) ×3 IMPLANT
NDL HYPO 25GX1X1/2 BEV (NEEDLE) ×1 IMPLANT
NEEDLE HYPO 25GX1X1/2 BEV (NEEDLE) ×3 IMPLANT
NS IRRIG 1000ML POUR BTL (IV SOLUTION) ×3 IMPLANT
PACK GENERAL/GYN (CUSTOM PROCEDURE TRAY) ×3 IMPLANT
PACK TOTAL JOINT (CUSTOM PROCEDURE TRAY) ×3 IMPLANT
PAD ARMBOARD 7.5X6 YLW CONV (MISCELLANEOUS) ×6 IMPLANT
PASSER SUT SWANSON 36MM LOOP (INSTRUMENTS) ×3 IMPLANT
PILLOW ABDUCTION HIP (SOFTGOODS) ×3 IMPLANT
PRESSURIZER FEMORAL UNIV (MISCELLANEOUS) IMPLANT
SET HNDPC FAN SPRY TIP SCT (DISPOSABLE) IMPLANT
SPONGE GAUZE 4X4 12PLY (GAUZE/BANDAGES/DRESSINGS) ×3 IMPLANT
SPONGE LAP 4X18 X RAY DECT (DISPOSABLE) IMPLANT
STAPLER VISISTAT 35W (STAPLE) ×3 IMPLANT
SUCTION FRAZIER TIP 10 FR DISP (SUCTIONS) ×3 IMPLANT
SUT FIBERWIRE #2 38 REV NDL BL (SUTURE) ×9
SUT VIC AB 0 CTB1 27 (SUTURE) ×3 IMPLANT
SUT VIC AB 1 CTX 36 (SUTURE) ×12
SUT VIC AB 1 CTX36XBRD ANBCTR (SUTURE) ×8 IMPLANT
SUT VIC AB 2-0 CT1 27 (SUTURE)
SUT VIC AB 2-0 CT1 TAPERPNT 27 (SUTURE) IMPLANT
SUT VIC AB 2-0 FS1 27 (SUTURE) ×3 IMPLANT
SUT VIC AB 2-0 SH 27 (SUTURE) ×6
SUT VIC AB 2-0 SH 27XBRD (SUTURE) ×4 IMPLANT
SUT VIC AB 3-0 SH 27 (SUTURE)
SUT VIC AB 3-0 SH 27X BRD (SUTURE) IMPLANT
SUTURE FIBERWR#2 38 REV NDL BL (SUTURE) ×6 IMPLANT
SYR 30ML SLIP (SYRINGE) ×1 IMPLANT
SYR CONTROL 10ML LL (SYRINGE) ×3 IMPLANT
TAPE CLOTH SURG 6X10 WHT LF (GAUZE/BANDAGES/DRESSINGS) ×2 IMPLANT
TOWEL OR 17X24 6PK STRL BLUE (TOWEL DISPOSABLE) ×3 IMPLANT
TOWEL OR 17X26 10 PK STRL BLUE (TOWEL DISPOSABLE) ×3 IMPLANT
TOWER CARTRIDGE SMART MIX (DISPOSABLE) IMPLANT
TRAY FOLEY CATH 14FR (SET/KITS/TRAYS/PACK) ×3 IMPLANT
WATER STERILE IRR 1000ML POUR (IV SOLUTION) ×6 IMPLANT

## 2012-07-27 NOTE — OR Nursing (Signed)
Dr Gypsy Balsam notified of pt c/o itching/ head to toe and no rash seen -- benadryl IV per Gypsy Balsam given / Dr Eulah Pont also aware and saw pt, chooses to monitor at present.

## 2012-07-27 NOTE — Preoperative (Signed)
Beta Blockers   Reason not to administer Beta Blockers:Not Applicable 

## 2012-07-27 NOTE — OR Nursing (Signed)
Dr. Eulah Pont aware that pt. Is still itching. He wants to continue PCA. Benadryl repeated.  Will continue to monitor.

## 2012-07-27 NOTE — Interval H&P Note (Signed)
History and Physical Interval Note:  07/27/2012 8:32 AM  Christine [name]  has presented today for surgery, with the diagnosis of DJD LEFT HIP  The various methods of treatment have been discussed with the patient and family. After consideration of risks, benefits and other options for treatment, the patient has consented to  Procedure(s) (LRB): TOTAL HIP ARTHROPLASTY (Left) EXCISION/RELEASE BURSA HIP (Left) as a surgical intervention .  The patient's history has been reviewed, patient examined, no change in status, stable for surgery.  I have reviewed the patient's chart and labs.  Questions were answered to the patient's satisfaction.     [name] F

## 2012-07-27 NOTE — Anesthesia Preprocedure Evaluation (Addendum)
Anesthesia Evaluation  Patient identified by MRN, date of birth, ID band Patient awake    Reviewed: Allergy & Precautions, H&P , NPO status , Patient's Chart, lab work & pertinent test results  History of Anesthesia Complications (+) PONV  Airway Mallampati: II TM Distance: >3 FB Neck ROM: Full    Dental  (+) Teeth Intact and Dental Advisory Given   Pulmonary neg pulmonary ROS,  breath sounds clear to auscultation  Pulmonary exam normal       Cardiovascular negative cardio ROS  Rhythm:Regular Rate:Normal     Neuro/Psych PSYCHIATRIC DISORDERS Depression negative neurological ROS     GI/Hepatic Neg liver ROS, GERD-  Medicated,  Endo/Other  negative endocrine ROS  Renal/GU negative Renal ROS     Musculoskeletal  (+) Arthritis -, Osteoarthritis,    Abdominal   Peds  Hematology   Anesthesia Other Findings   Reproductive/Obstetrics negative OB ROS                          Anesthesia Physical Anesthesia Plan  ASA: II  Anesthesia Plan: General   Post-op Pain Management:    Induction: Intravenous  Airway Management Planned: Oral ETT  Additional Equipment:   Intra-op Plan:   Post-operative Plan: Extubation in OR  Informed Consent: I have reviewed the patients History and Physical, chart, labs and discussed the procedure including the risks, benefits and alternatives for the proposed anesthesia with the patient or authorized representative who has indicated his/her understanding and acceptance.     Plan Discussed with: CRNA and Surgeon  Anesthesia Plan Comments:         Anesthesia Quick Evaluation

## 2012-07-27 NOTE — Progress Notes (Signed)
UR COMPLETED  

## 2012-07-27 NOTE — Plan of Care (Signed)
Problem: Consults Goal: Diagnosis- Total Joint Replacement Primary Total Hip Left     

## 2012-07-27 NOTE — Anesthesia Postprocedure Evaluation (Signed)
  Anesthesia Post-op Note  Patient: Christine [name]  Procedure(s) Performed: Procedure(s) (LRB): TOTAL HIP ARTHROPLASTY (Left)  Patient Location: PACU  Anesthesia Type: General  Level of Consciousness: awake  Airway and Oxygen Therapy: Patient Spontanous Breathing  Post-op Pain: mild  Post-op Assessment: Post-op Vital signs reviewed, Patient's Cardiovascular Status Stable, Respiratory Function Stable, Patent Airway, No signs of Nausea or vomiting and Pain level controlled  Post-op Vital Signs: stable  Complications: No apparent anesthesia complications

## 2012-07-27 NOTE — Transfer of Care (Signed)
Immediate Anesthesia Transfer of Care Note  Patient: Christine [name]  Procedure(s) Performed: Procedure(s) (LRB): TOTAL HIP ARTHROPLASTY (Left)  Patient Location: PACU  Anesthesia Type: General  Level of Consciousness: awake, alert  and oriented  Airway & Oxygen Therapy: Patient Spontanous Breathing and Patient connected to nasal cannula oxygen  Post-op Assessment: Report given to PACU RN and Post -op Vital signs reviewed and stable  Post vital signs: Reviewed and stable  Complications: No apparent anesthesia complications

## 2012-07-27 NOTE — Progress Notes (Signed)
Orthopedic Tech Progress Note Patient Details:  Christine [name] 01-Nov-1951 [id number]  Patient ID: Christine [name], female   DOB: Apr 06, 1951, 61 y.o.   MRN: [id number]   Christine [name] 07/27/2012, 4:30 PM Trapeze bar

## 2012-07-27 NOTE — Progress Notes (Signed)
ANTICOAGULATION CONSULT NOTE - Initial Consult  Pharmacy Consult for Coumadin Indication: VTE prophylaxis  Allergies  Allergen Reactions  . Dilaudid (Hydromorphone Hcl) Itching  . Other Nausea And Vomiting    Allergic to pain medications.   . Sulfa Antibiotics Rash   Labs: Baseline INR = 0.92  Medical History: Past Medical History  Diagnosis Date  . Anemia 2012    "slight"  . Blood transfusion 70's    bleeding  after a fertility exploratory surgery   . GERD (gastroesophageal reflux disease)   . Arthritis   . Depression   . Bronchitis     hx of   Assessment: 61 year old female weighing 69 kg to begin Coumadin for VTE prophylaxis s/p hip arthroplasty  Goal of Therapy:  INR 2-3 Monitor platelets by anticoagulation protocol: Yes   Plan:  1) Coumadin 5 mg po daily at 1800  2) Daily INR 3) Coumadin education  Thank you. Okey Regal, PharmD [phone]  07/27/2012,3:32 PM

## 2012-07-27 NOTE — Brief Op Note (Signed)
07/27/2012  1:45 PM  PATIENT:  Christine [name]  61 y.o. female  PRE-OPERATIVE DIAGNOSIS:  DJD LEFT HIP  POST-OPERATIVE DIAGNOSIS:  DJD LEFT HIP  PROCEDURE:  Procedure(s) (LRB): TOTAL HIP ARTHROPLASTY (Left)  SURGEON:  Surgeon(s) and Role:    * Loreta Ave, MD - Primary  PHYSICIAN ASSISTANT: Zonia Kief M   ANESTHESIA:   general  EBL:  Total I/O In: 1800 [I.V.:1800] Out: 325 [Urine:200; Blood:125]  BLOOD ADMINISTERED:none  LOCAL MEDICATIONS USED:  MARCAINE     SPECIMEN:  No Specimen  DISPOSITION OF SPECIMEN:  N/A  COUNTS:  YES  TOURNIQUET:  * No tourniquets in log *   PATIENT DISPOSITION:  PACU - hemodynamically stable.

## 2012-07-28 ENCOUNTER — Encounter (HOSPITAL_COMMUNITY): Payer: Self-pay | Admitting: Orthopedic Surgery

## 2012-07-28 LAB — CBC
Platelets: 247 10*3/uL (ref 150–400)
RBC: 3.17 MIL/uL — ABNORMAL LOW (ref 3.87–5.11)
RDW: 12.7 % (ref 11.5–15.5)
WBC: 8.7 10*3/uL (ref 4.0–10.5)

## 2012-07-28 LAB — BASIC METABOLIC PANEL
CO2: 30 mEq/L (ref 19–32)
Chloride: 103 mEq/L (ref 96–112)
Creatinine, Ser: 0.61 mg/dL (ref 0.50–1.10)
GFR calc Af Amer: >90 mL/min (ref >90)
Sodium: 138 mEq/L (ref 135–145)

## 2012-07-28 LAB — PROTIME-INR: INR: 1.06 (ref 0.00–1.49)

## 2012-07-28 MED ORDER — TAPENTADOL HCL 50 MG PO TABS
75.0000 mg | ORAL_TABLET | ORAL | Status: DC | PRN
  Administered 2012-07-28 – 2012-07-30 (×8): 75 mg via ORAL
  Filled 2012-07-28: qty 2
  Filled 2012-07-28: qty 1
  Filled 2012-07-28 (×3): qty 2
  Filled 2012-07-28: qty 1
  Filled 2012-07-28 (×3): qty 2

## 2012-07-28 MED ORDER — WHITE PETROLATUM GEL
Status: AC
  Filled 2012-07-28: qty 5

## 2012-07-28 NOTE — Care Management Note (Unsigned)
    Page 1 of 1   07/28/2012     10:49:07 AM   CARE MANAGEMENT NOTE 07/28/2012  Patient:  Christine [name], Christine [name]   Account Number:  [id number]  Date Initiated:  07/28/2012  Documentation initiated by:  Anette Guarneri  Subjective/Objective Assessment:   POD#1 s/p left THA  will HH services/has DME  Lives at home w/spouse. Spouse will be caregiver after discharge     Action/Plan:   Home with [facility] services   Anticipated DC Date:  07/30/2012   Anticipated DC Plan:  HOME W HOME HEALTH SERVICES      DC Planning Services  CM consult      Choice offered to / List presented to:             Status of service:  In process, will continue to follow Medicare Important Message given?  NO (If response is "NO", the following Medicare IM given date fields will be blank) Date Medicare IM given:   Date Additional Medicare IM given:    Discharge Disposition:  HOME W HOME HEALTH SERVICES  Per UR Regulation:  Reviewed for med. necessity/level of care/duration of stay  If discussed at Long Length of Stay Meetings, dates discussed:    Comments:  07/28/12  10:46  Anette Guarneri RN/CM MD office pre-arranged for [facility] services with [facility], per Ms.Garner she has RW/BSC and shower bench.

## 2012-07-28 NOTE — Progress Notes (Signed)
Occupational Therapy Evaluation Patient Details Name: Christine [name] MRN: [id number] DOB: [birth year] Today's Date: 07/28/2012 Time: 4098-1191 OT Time Calculation (min): 38 min  OT Assessment / Plan / Recommendation Clinical Impression  61 yo s/p L THA. Pt with limited participation this pm due to pain. Pt only able to take 3 steps before requesting to sit. Educated pt on hip precautions. Written sheet given. Pt will benefit from skilled Ot services to max independence with ADL and functional mobility for ADL to facilitate D/C home with [facility] services.     OT Assessment  Patient needs continued OT Services    Follow Up Recommendations  Home health OT    Barriers to Discharge Decreased caregiver support    Equipment Recommendations  Other (comment) (hip kit)    Recommendations for Other Services    Frequency  Min 3X/week    Precautions / Restrictions Precautions Precautions: Posterior Hip Precaution Booklet Issued: Yes (comment) Precaution Comments: able to recall 3/3 Restrictions Weight Bearing Restrictions: Yes LLE Weight Bearing: Weight bearing as tolerated   Pertinent Vitals/Pain 8. Received meds prior to OT. Repositioned. Ice applied.    ADL  Grooming: Simulated;Set up Where Assessed - Grooming: Unsupported sitting Upper Body Bathing: Simulated;Set up Where Assessed - Upper Body Bathing: Unsupported sitting Lower Body Bathing: Simulated;Maximal assistance Where Assessed - Lower Body Bathing: Supported sit to stand Upper Body Dressing: Simulated;Set up Where Assessed - Upper Body Dressing: Unsupported sitting Lower Body Dressing: Simulated;Maximal assistance Where Assessed - Lower Body Dressing: Supported sit to stand Toilet Transfer: Simulated;Other (comment) (bed - chair) Toilet Transfer Method: Sit to stand;Stand pivot Toilet Transfer Equipment: Other (comment) (bed - chair) Toileting - Clothing Manipulation and Hygiene: Simulated;Moderate assistance Where Assessed  - Toileting Clothing Manipulation and Hygiene: Standing Equipment Used: Reacher;Rolling walker;Sock aid;Long-handled sponge;Long-handled shoe horn;Gait belt Transfers/Ambulation Related to ADLs: min A. limited mobility due to pain. ADL Comments: no knowledge of ADL with hip precautions. Educated pt on availablility of AE and use of AE for ADL. Pt states she will have her husband buy hip kit.    OT Diagnosis: Generalized weakness;Acute pain  OT Problem List: Decreased strength;Decreased range of motion;Decreased activity tolerance;Decreased safety awareness;Decreased knowledge of use of DME or AE;Decreased knowledge of precautions;Pain OT Treatment Interventions: Self-care/ADL training;Energy conservation;DME and/or AE instruction;Therapeutic activities;Patient/family education   OT Goals Acute Rehab OT Goals OT Goal Formulation: With patient Time For Goal Achievement: 08/04/12 Potential to Achieve Goals: Good ADL Goals Pt Will Perform Lower Body Bathing: with supervision;with caregiver independent in assisting;Sit to stand from chair;with adaptive equipment;with cueing (comment type and amount);Unsupported ADL Goal: Lower Body Bathing - Progress: Goal set today Pt Will Perform Lower Body Dressing: with supervision;with caregiver independent in assisting;Sit to stand from chair;Unsupported;with adaptive equipment;with cueing (comment type and amount) ADL Goal: Lower Body Dressing - Progress: Goal set today Pt Will Transfer to Toilet: with supervision;with caregiver independent in assisting;with DME;3-in-1;Maintaining hip precautions;with cueing (comment type and amount) ADL Goal: Toilet Transfer - Progress: Goal set today Pt Will Perform Toileting - Clothing Manipulation: with modified independence;Standing ADL Goal: Toileting - Clothing Manipulation - Progress: Goal set today Pt Will Perform Toileting - Hygiene: with modified independence;Standing at 3-in-1/toilet ADL Goal: Toileting -  Hygiene - Progress: Goal set today Pt Will Perform Tub/Shower Transfer: Shower transfer;with supervision;with caregiver independent in assisting;with DME;Maintaining hip precautions;with cueing (comment type and amount) ADL Goal: Tub/Shower Transfer - Progress: Goal set today Additional ADL Goal #1: Demonstrate 3/3 hip precautions during ADL independently. ADL Goal: Additional  Goal #1 - Progress: Goal set today  Visit Information  Last OT Received On: 07/28/12 Assistance Needed: +1    Subjective Data   I'm in so much pain.   Prior Functioning  Vision/Perception  Home Living Lives With: Spouse Available Help at Discharge: Family;Neighbor Type of Home: House Home Access: Stairs to enter [employer] of Steps: 3 Entrance Stairs-Rails: Left Home Layout: Two level;Able to live on main level with bedroom/bathroom Bathroom Shower/Tub: Walk-in shower;Door [employer] Toilet: Handicapped height Bathroom Accessibility: Yes How Accessible: Accessible via walker Home Adaptive Equipment: Walker - rolling;Bedside commode/3-in-1;Built-in shower seat;Grab bars in shower;Grab bars around toilet Prior Function Level of Independence: Independent Able to Take Stairs?: Yes Driving: Yes Vocation: Full time employment Communication Communication: No difficulties Dominant Hand: Right      Cognition  Overall Cognitive Status: Appears within functional limits for tasks assessed/performed Arousal/Alertness: Awake/alert Orientation Level: Appears intact for tasks assessed;Oriented X4 / Intact Behavior During Session: [facility] for tasks performed    Extremity/Trunk Assessment Right Upper Extremity Assessment RUE ROM/Strength/Tone: [facility] for tasks assessed Left Upper Extremity Assessment LUE ROM/Strength/Tone: Within functional levels Right Lower Extremity Assessment RLE ROM/Strength/Tone: [facility] for tasks assessed Left Lower Extremity Assessment LLE ROM/Strength/Tone: Deficits;Unable to fully  assess;Due to pain;Due to precautions Trunk Assessment Trunk Assessment: Normal   Mobility  Shoulder Instructions  Bed Mobility Bed Mobility: Sit to Supine Supine to Sit: 4: Min assist;With rails;HOB flat Sitting - Scoot to Edge of Bed: 4: Min assist Sit to Supine: 3: Mod assist;HOB flat;Other (comment) (a to lift B legs onto bed) Details for Bed Mobility Assistance: vc for technique to follow hip precautions.a to left B LE Transfers Transfers: Sit to Stand;Stand to Sit Sit to Stand: 4: Min assist;With upper extremity assist;From chair/3-in-1 Stand to Sit: 4: Min guard;With upper extremity assist;To bed;To elevated surface Details for Transfer Assistance: Cues to slide LLE forward before sitting.       Exercise     Balance Balance Balance Assessed:  (WFL for ADL)   End of Session OT - End of Session Equipment Utilized During Treatment: Gait belt Activity Tolerance: Patient limited by pain Patient left: in bed;with call bell/phone within reach Nurse Communication: Other (comment) (pain control)  GO     Tavio Biegel,HILLARY 07/28/2012, 3:03 PM [facility], OTR/L  [phone] 07/28/2012

## 2012-07-28 NOTE — Progress Notes (Signed)
Subjective: Pain controlled this morning, but c/o itching from morphine pca.  Same reaction to percocet in past.     Objective: Vital signs in last 24 hours: Temp:  [97.5 F (36.4 C)-98.2 F (36.8 C)] 98.2 F (36.8 C) (08/29 0656) Pulse Rate:  [60-105] 82  (08/29 0656) Resp:  [10-21] 16  (08/29 0656) BP: (90-111)/(52-75) 102/64 mmHg (08/29 0656) SpO2:  [92 %-100 %] 100 % (08/29 0656)  Intake/Output from previous day: 08/28 0701 - 08/29 0700 In: 2880 [I.V.:2880] Out: 325 [Urine:200; Blood:125] Intake/Output this shift:     Basename 07/28/12 0610  HGB 9.5*    Basename 07/28/12 0610  WBC 8.7  RBC 3.17*  HCT 28.6*  PLT 247    Basename 07/28/12 0610  NA 138  K 4.0  CL 103  CO2 30  BUN 6  CREATININE 0.61  GLUCOSE 111*  CALCIUM 8.8    Basename 07/28/12 0610  LABPT --  INR 1.06    Exam:  Dressing c/d/i.  Calf nt, nvi.    Assessment/Plan: D/c morphine.  Will try nucynta 75mg  po q4hrs prn.  Anticipate d/c home fri or sat.     Atzin Buchta M 07/28/2012, 12:13 PM

## 2012-07-28 NOTE — Evaluation (Signed)
Physical Therapy Evaluation Patient Details Name: Christine [name] MRN: [id number] DOB: 08-04-1951 Today's Date: 07/28/2012 Time: 1914-7829 PT Time Calculation (min): 27 min  PT Assessment / Plan / Recommendation Clinical Impression  Pt is pleasent and cooperative 61 y.o. female s/p left hip THA. Pt presents with deficits in functinoal mobility secondary to pain, precautions and decreased activity tolerance.  Pt will continue to benefit from skilled PT to improve overall functional mobility and maximize independence.    PT Assessment  Patient needs continued PT services    Follow Up Recommendations  Home health PT    Barriers to Discharge        Equipment Recommendations       Recommendations for Other Services OT consult   Frequency 7X/week    Precautions / Restrictions Precautions Precautions: Posterior Hip Precaution Booklet Issued: Yes (comment) Restrictions Weight Bearing Restrictions: Yes LLE Weight Bearing: Weight bearing as tolerated   Pertinent Vitals/Pain 7/10      Mobility  Bed Mobility Bed Mobility: Supine to Sit;Sitting - Scoot to Edge of Bed Supine to Sit: 4: Min assist;With rails;HOB flat Sitting - Scoot to [employer] of Bed: 4: Min assist Details for Bed Mobility Assistance: Assist for LLE Transfers Transfers: Sit to Stand;Stand to Sit Sit to Stand: 4: Min guard;From bed Stand to Sit: 4: Min guard;To chair/3-in-1;With armrests Ambulation/Gait Ambulation/Gait Assistance: 4: Min guard Ambulation Distance (Feet): 20 Feet Assistive device: Rolling walker Ambulation/Gait Assistance Details: VCs for sequencing initially Gait Pattern: Step-to pattern;Decreased stride length;Antalgic Gait velocity: decreased    Exercises     PT Diagnosis: Difficulty walking;Abnormality of gait;Generalized weakness;Acute pain  PT Problem List: Decreased strength;Decreased range of motion;Decreased activity tolerance;Decreased knowledge of use of DME;Pain PT Treatment  Interventions: DME instruction;Gait training;Stair training;Therapeutic activities;Therapeutic exercise;Functional mobility training;Patient/family education   PT Goals Acute Rehab PT Goals PT Goal Formulation: With patient Time For Goal Achievement: 08/02/12 Potential to Achieve Goals: Good Pt will go Sit to Stand: with modified independence PT Goal: Sit to Stand - Progress: Goal set today Pt will Ambulate: >150 feet;with modified independence;with rolling walker PT Goal: Ambulate - Progress: Goal set today Pt will Go Up / Down Stairs: 3-5 stairs;with least restrictive assistive device;with min assist PT Goal: Up/Down Stairs - Progress: Progressing toward goal Pt will Perform Home Exercise Program: Independently PT Goal: Perform Home Exercise Program - Progress: Goal set today  Visit Information  Last PT Received On: 07/28/12 Assistance Needed: +1    Subjective Data  Subjective: Ive been itiching like crazy Patient Stated Goal: to go home   Prior Functioning  Home Living Lives With: Spouse Available Help at Discharge: Family;Neighbor Type of Home: House Home Access: Stairs to enter [employer] of Steps: 3 Entrance Stairs-Rails: Left Home Layout: Two level;Able to live on main level with bedroom/bathroom Bathroom Shower/Tub: [employer] Toilet: Handicapped height Home Adaptive Equipment: Walker - rolling;Bedside commode/3-in-1;Built-in shower seat Prior Function Level of Independence: Independent Able to Take Stairs?: Yes Driving: Yes Vocation: Full time employment    Cognition  Overall Cognitive Status: Appears within functional limits for tasks assessed/performed Arousal/Alertness: Awake/alert Orientation Level: Appears intact for tasks assessed;Oriented X4 / Intact Behavior During Session: [facility] for tasks performed    Extremity/Trunk Assessment Right Upper Extremity Assessment RUE ROM/Strength/Tone: [facility] for tasks assessed Left Upper Extremity  Assessment LUE ROM/Strength/Tone: Within functional levels Right Lower Extremity Assessment RLE ROM/Strength/Tone: [facility] for tasks assessed Left Lower Extremity Assessment LLE ROM/Strength/Tone: Deficits;Unable to fully assess;Due to pain;Due to precautions Trunk Assessment Trunk  Assessment: Normal   Balance    End of Session PT - End of Session Equipment Utilized During Treatment: Gait belt Activity Tolerance: Patient tolerated treatment well;Patient limited by pain;Patient limited by fatigue Patient left: in chair;with call bell/phone within reach Nurse Communication: Mobility status  GP     Fabio Asa 07/28/2012, 12:38 PM Charlotte Crumb, PT DPT  [phone]

## 2012-07-28 NOTE — Progress Notes (Signed)
Physical Therapy Treatment Patient Details Name: Christine [name] MRN: [id number] DOB: [birth year] Today's Date: 07/28/2012 Time: 4098-1191 PT Time Calculation (min): 15 min  PT Assessment / Plan / Recommendation Comments on Treatment Session  Pt in extreme pain this afternoon. Patient unable to ambulate at this time secondary to pain in LLE.  Will follow up tomorrow for progression of ambulation and ther-ex.    Follow Up Recommendations  Home health PT    Barriers to Discharge        Equipment Recommendations  Other (comment) (hip kit)    Recommendations for Other Services    Frequency 7X/week   Plan Discharge plan remains appropriate    Precautions / Restrictions Precautions Precautions: Posterior Hip Precaution Booklet Issued: Yes (comment) Precaution Comments: able to recall 3/3 Restrictions Weight Bearing Restrictions: Yes LLE Weight Bearing: Weight bearing as tolerated   Pertinent Vitals/Pain 9/10    Mobility  Bed Mobility Bed Mobility: Supine to Sit;Sitting - Scoot to Edge of Bed Supine to Sit: 4: Min assist;With rails;HOB flat Sitting - Scoot to Edge of Bed: 4: Min assist Sit to Supine: 3: Mod assist;HOB flat;Other (comment) (a to lift B legs onto bed) Details for Bed Mobility Assistance: Min assist for LLE  Transfers Transfers: Sit to Stand;Stand to Sit Sit to Stand: 4: Min guard;From bed Stand to Sit: To chair/3-in-1;With armrests;4: Min assist Details for Transfer Assistance: Transfer to commode needing assist for LLE Ambulation/Gait Ambulation/Gait Assistance: 4: Min guard Ambulation Distance (Feet): 3 Feet Assistive device: Rolling walker     PT Goals Acute Rehab PT Goals PT Goal: Sit to Stand - Progress: Progressing toward goal PT Goal: Ambulate - Progress: Progressing toward goal  Visit Information  Last PT Received On: 09/13/12 Assistance Needed: +1    Subjective Data  Subjective: I'm in so much pain  Patient Stated Goal: to go home     Cognition  Overall Cognitive Status: Appears within functional limits for tasks assessed/performed Arousal/Alertness: Awake/alert Orientation Level: Appears intact for tasks assessed;Oriented X4 / Intact Behavior During Session: [facility] for tasks performed    Balance  Balance Balance Assessed:  (WFL for ADL)  End of Session PT - End of Session Equipment Utilized During Treatment: Gait belt Activity Tolerance: Patient limited by pain Patient left: in chair;with call bell/phone within reach (commode) Nurse Communication: Mobility status;Other (comment) (pt on commode bedside)   GP     Fabio Asa 07/28/2012, 3:35 PM Charlotte Crumb, PT DPT  [phone]

## 2012-07-28 NOTE — Progress Notes (Signed)
ANTICOAGULATION CONSULT NOTE - Follow Up Consult  Pharmacy Consult for Coumadin Indication: THA  Allergies  Allergen Reactions  . Other Itching and Nausea And Vomiting    "to pain medications; usually moderate to severe".   . Dilaudid (Hydromorphone Hcl) Itching  . Sulfa Antibiotics Rash    Patient Measurements:   Heparin Dosing Weight:    Vital Signs: Temp: 98.2 F (36.8 C) (08/29 0656) BP: 102/64 mmHg (08/29 0656) Pulse Rate: 82  (08/29 0656)  Labs:  Basename 07/28/12 0610  HGB 9.5*  HCT 28.6*  PLT 247  APTT --  LABPROT 14.0  INR 1.06  HEPARINUNFRC --  CREATININE 0.61  CKTOTAL --  CKMB --  TROPONINI --    The CrCl is unknown because both a height and weight (above a minimum accepted value) are required for this calculation.  Assessment:  Coumadin for VTE prophylaxis s/p THA  Anticoagulation: INR 1.06. Lovenox 40mg /d until INR>2. Started 5mg /day. Post-op Hgb 9.5 Neurology: Depression on Cymbalta Nephrology: Scr stable 0.61 PTA Medication Issues: resumed with exception of Premarin  Goal of Therapy:  INR 2-3 Monitor platelets by anticoagulation protocol: Yes   Plan:  Continue Coumadin 5mg /day for now.  Merilynn Finland, [employer] 07/28/2012,9:40 AM

## 2012-07-28 NOTE — Progress Notes (Signed)
Referral received for SNF. Chart reviewed and CSW has spoken with RNCM who indicates that patient is for DC to home with Home Health and DME.  CSW to sign off. Please re-consult if CSW needs arise.  Christine [name] T. Clete Kuch, LCSWA  [phone]  

## 2012-07-29 ENCOUNTER — Inpatient Hospital Stay (HOSPITAL_COMMUNITY): Payer: 59

## 2012-07-29 LAB — CBC
Hemoglobin: 9.2 g/dL — ABNORMAL LOW (ref 12.0–15.0)
MCH: 29.8 pg (ref 26.0–34.0)
MCHC: 33 g/dL (ref 30.0–36.0)
Platelets: 238 10*3/uL (ref 150–400)
RBC: 3.09 MIL/uL — ABNORMAL LOW (ref 3.87–5.11)

## 2012-07-29 LAB — BASIC METABOLIC PANEL
Calcium: 8.4 mg/dL (ref 8.4–10.5)
GFR calc non Af Amer: >90 mL/min (ref >90)
Potassium: 3.5 mEq/L (ref 3.5–5.1)
Sodium: 138 mEq/L (ref 135–145)

## 2012-07-29 LAB — PROTIME-INR
INR: 1.27 (ref 0.00–1.49)
Prothrombin Time: 16.2 seconds — ABNORMAL HIGH (ref 11.6–15.2)

## 2012-07-29 MED ORDER — METHOCARBAMOL 500 MG PO TABS: 500.0000 mg | ORAL_TABLET | Freq: Four times a day (QID) | ORAL | Status: AC | PRN

## 2012-07-29 MED ORDER — TAPENTADOL HCL 100 MG PO TABS: 100.0000 mg | ORAL_TABLET | ORAL | Status: DC | PRN

## 2012-07-29 MED ORDER — WARFARIN SODIUM 5 MG PO TABS: 5.0000 mg | ORAL_TABLET | Freq: Every day | ORAL | Status: AC

## 2012-07-29 MED ORDER — BISACODYL 10 MG RE SUPP
10.0000 mg | Freq: Once | RECTAL | Status: AC
  Administered 2012-07-29: 10 mg via RECTAL
  Filled 2012-07-29: qty 1

## 2012-07-29 MED ORDER — ENOXAPARIN SODIUM 40 MG/0.4ML ~~LOC~~ SOLN: 40.0000 mg | SUBCUTANEOUS | Status: DC

## 2012-07-29 NOTE — Plan of Care (Signed)
Problem: Discharge Progression Outcomes Goal: Negotiates stairs Outcome: Not Met (add Reason) Patient demonstrates delayed progress and has been unsafe for stair training at this time

## 2012-07-29 NOTE — Progress Notes (Signed)
Occupational Therapy Treatment Patient Details Name: Christine [name] MRN: [id number] DOB: 05-Jan-1951 Today's Date: 07/29/2012 Time: 4098-1191 OT Time Calculation (min): 42 min  OT Assessment / Plan / Recommendation Comments on Treatment Session Pt with significant improvement in mobility, safety and transfers by end of session. Believe pt will be safe to return home with assist of husband pending pt's ability to perform stairs with PT tomorrow. Pt with BM this session    Follow Up Recommendations  Home health OT;Supervision/Assistance - 24 hour (initial 24hr assist)    Barriers to Discharge       Equipment Recommendations   (hip kit- husband to purchase)    Recommendations for Other Services    Frequency     Plan Discharge plan remains appropriate    Precautions / Restrictions Precautions Precautions: Posterior Hip Precaution Booklet Issued: Yes (comment) Precaution Comments: able to recall 3/3 (VC to generalize throughout session with functional activity) Restrictions Weight Bearing Restrictions: Yes LLE Weight Bearing: Weight bearing as tolerated   Pertinent Vitals/Pain Pt reports 6/10 Rt hip pain- premedicated and repositioned    ADL  Grooming: Performed;Wash/dry hands;Minimal assistance Where Assessed - Grooming: Supported standing Toilet Transfer: Performed;Min Pension scheme manager Method: Sit to Barista: Raised toilet seat with arms (or 3-in-1 over toilet) Toileting - Clothing Manipulation and Hygiene: Performed;Min guard Where Assessed - Engineer, mining and Hygiene: Sit to stand from 3-in-1 or toilet Tub/Shower Transfer: Performed;Moderate assistance (majority of assist needed for sequencing) Tub/Shower Transfer Method: Ambulating (backward entry) Tub/Shower Transfer Equipment: Walk in shower;Grab bars;Shower seat with back Equipment Used: Gait belt;Rolling walker Transfers/Ambulation Related to ADLs: Min guard A with RW  ambulation in room and into hallway ADL Comments: Pt previously limited in mobility. Discussed with pt the need for SNF placement at this point as pt unsafe to return home. Pt subsequently able to demonstrate incr mobility, I with transfers and knowledge of precautions/safety to a level condusive with return home with assist of husband. Husband in room at end of session and states he will be able to take off 2-3 days next week if needed to remain at home with wife and provide necessary assist.     OT Diagnosis:    OT Problem List:   OT Treatment Interventions:     OT Goals ADL Goals ADL Goal: Toilet Transfer - Progress: Progressing toward goals ADL Goal: Toileting - Clothing Manipulation - Progress: Progressing toward goals ADL Goal: Toileting - Hygiene - Progress: Progressing toward goals ADL Goal: Tub/Shower Transfer - Progress: Progressing toward goals ADL Goal: Additional Goal #1 - Progress: Progressing toward goals  Visit Information  Last OT Received On: 07/29/12 Assistance Needed: +1    Subjective Data      Prior Functioning       Cognition  Overall Cognitive Status: Appears within functional limits for tasks assessed/performed Arousal/Alertness: Awake/alert Orientation Level: Appears intact for tasks assessed;Oriented X4 / Intact Behavior During Session: [facility] for tasks performed    Mobility  Shoulder Instructions Bed Mobility Bed Mobility: Supine to Sit;Sitting - Scoot to Edge of Bed Supine to Sit: 4: Min assist;With rails;HOB flat Sitting - Scoot to [place] of Bed: 4: Min assist Details for Bed Mobility Assistance: Assist require for LLE; patient had difficulty with supine to sit  without a rail. Transfers Sit to Stand: 4: Min guard;From bed;With upper extremity assist (with and without armrests) Stand to Sit: 4: Min guard;To chair/3-in-1;To bed (with and without armrests) Details for Transfer Assistance: Pt initially unsafe  and requiring Mod a for sit to stand from  surface without armrests. Educated pt on sequencing and technique and need to be able to perform this in order to return home. Pt able to perform with Min guard A at end of session 4 times in a row       Exercises      Balance     End of Session OT - End of Session Equipment Utilized During Treatment: Gait belt Activity Tolerance: Patient tolerated treatment well Patient left: in chair;with call bell/phone within reach;with family/visitor present Nurse Communication: Mobility status  GO     Hafsah Hendler 07/29/2012, 3:07 PM

## 2012-07-29 NOTE — Progress Notes (Signed)
Subjective: Slow progress with therapy yesterday due to pain.  Better today.   Objective: Vital signs in last 24 hours: Temp:  [99.6 F (37.6 C)-101.2 F (38.4 C)] 99.6 F (37.6 C) (08/30 0835) Pulse Rate:  [96-112] 96  (08/30 0835) Resp:  [16-18] 17  (08/30 0835) BP: (99-109)/(56-76) 104/60 mmHg (08/30 0835) SpO2:  [93 %-100 %] 100 % (08/30 0835)  Intake/Output from previous day: 08/29 0701 - 08/30 0700 In: 720 [P.O.:720] Out: 1150 [Urine:1150] Intake/Output this shift:     Basename 07/29/12 0615 07/28/12 0610  HGB 9.2* 9.5*    Basename 07/29/12 0615 07/28/12 0610  WBC 9.3 8.7  RBC 3.09* 3.17*  HCT 27.9* 28.6*  PLT 238 247    Basename 07/29/12 0615 07/28/12 0610  NA 138 138  K 3.5 4.0  CL 100 103  CO2 29 30  BUN 6 6  CREATININE 0.67 0.61  GLUCOSE 108* 111*  CALCIUM 8.4 8.8    Basename 07/29/12 0615 07/28/12 0610  LABPT -- --  INR 1.27 1.06    Exam:  Wound looks good.  Staples intact.  No drainage or signs of infection.  Calf nt, nvi.  Assessment/Plan: Increase temp. Will check chest xray to r/o atelectasis.  Possible d/c home today if does well with therapy.     Oktober Glazer M 07/29/2012, 11:05 AM

## 2012-07-29 NOTE — Op Note (Signed)
[name], [name] NO.:  [id number]  MEDICAL RECORD NO.:  [id number]  LOCATION:  [id number]                        FACILITY:  [facility]  PHYSICIAN:  Loreta Ave, M.D. DATE OF BIRTH:  22-Jan-1951  DATE OF PROCEDURE:  07/27/2012 DATE OF DISCHARGE:                              OPERATIVE REPORT   PREOPERATIVE DIAGNOSES:  Left hip end-stage degenerative arthritis. Chronic trochanteric bursitis.  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  PROCEDURE:  Left total hip replacement utilizing Stryker prosthesis.  A press-fit 50 mm cup.  Screw fixation x2.  A 36 mm internal diameter polyethylene liner with a 10 degree overhang posterosuperior.  A press- fit #8 Secur-Fit stem.  A 30 mm neck, 127 degree neck angle.  A 36+ 0 BioLock head.  Excision of trochanteric bursa.  SURGEON:  Loreta Ave, MD  ASSISTANT:  Genene Churn. Barry Dienes, [place], present throughout the entire case and necessary for timely completion of procedure.  ANESTHESIA:  General.  BLOOD LOSS:  150 mL.  BLOOD GIVEN:  None.  SPECIMENS:  None.  CULTURES:  None.  COMPLICATIONS:  None.  DRESSING:  Soft compressive with abduction pillow.  PROCEDURE:  The patient was brought to the operating room and placed on the operating table in supine position.  After adequate anesthesia had been obtained, placed in a lateral position.  Prepped and draped in usual sterile fashion.  Incision along the shaft of femur extending posterosuperior.  Skin and subcutaneous tissue divided.  Iliotibial band was not too tight.  Incised longitudinally.  Charnley retractor put in place.  Neurovascular structures identified, protected.  Thickened chronic trochanteric bursa excised.  I did not appreciate significant prominent exostosis below.  External rotator capsule taken down off the back of the  intertrochanteric groove, tied with FiberWire.  Hip dislocated.  Femoral neck cut 1 fingerbreadth above the lesser trochanter.  Acetabulum exposed.  Once this was exposed, it was obvious that there was a fairly significant deficiency of the posterior wall of the acetabulum from chronic degenerative arthritis.  I brought the acetabulum as medial and inferior as I could with reamers.  Sized for a 50 prosthesis.  This was hammered in place at 45 degrees of abduction, 20 degrees of anteversion.  This was left a little uncovered on the posterior aspect because of the posterior wall but not enough that I felt grafting that was indicated.  Excellent capturing and fixation. Augmented with 2 screws through the cup.  The liner placed in the overlying posterosuperior.  Femur exposed.  Proximal and distal reamers brought up to a size #8.  After appropriate trials, a #8 prosthesis was seated.  A 36+ 0 head attached.  Hip reduced.  Excellent stability in flexion and extension.  Equal leg lengths.  Wound thoroughly irrigated. External rotator capsule repaired to the back of the intertrochanteric groove through drill holes, tied over bony bridge.  Wound irrigated again.  The iliotibial band closed with #1 Vicryl.  Skin and subcutaneous tissue with Vicryl and staples.  Margins were injected with Marcaine.  Sterile compressive dressing applied.  Returned to supine  position.  Abduction pillow placed.  Anesthesia reversed.  Brought to the recovery room.  Tolerated surgery well.  No complications.     Loreta Ave, M.D.     DFM/MEDQ  D:  07/28/2012  T:  07/29/2012  Job:  [id number]

## 2012-07-29 NOTE — Progress Notes (Signed)
ANTICOAGULATION CONSULT NOTE - Follow Up Consult  Pharmacy Consult for Coumadin Indication: THA  Allergies  Allergen Reactions  . Other Itching and Nausea And Vomiting    "to pain medications; usually moderate to severe".   . Dilaudid (Hydromorphone Hcl) Itching  . Sulfa Antibiotics Rash    Patient Measurements:    Weight:  76.8 kg  Vital Signs: Temp: 99.6 F (37.6 C) (08/30 0835) Temp src: Oral (08/30 0835) BP: 104/60 mmHg (08/30 0835) Pulse Rate: 96  (08/30 0835)  Labs:  Basename 07/29/12 0615 07/28/12 0610  HGB 9.2* 9.5*  HCT 27.9* 28.6*  PLT 238 247  APTT -- --  LABPROT 16.2* 14.0  INR 1.27 1.06  HEPARINUNFRC -- --  CREATININE 0.67 0.61  CKTOTAL -- --  CKMB -- --  TROPONINI -- --    The CrCl is unknown because both a height and weight (above a minimum accepted value) are required for this calculation.  Assessment:  Coumadin for VTE prophylaxis s/p THA. INR 1.27 after 2 doses of coumadin 5 mg. Lovenox 40mg /d until INR>2.  H/H stable post-op.  No bleeding reported.  Coumadin score = 3. Goal of Therapy:  INR 2-3 Plan:  1. Continue coumadin 5 mg po daily 2. Continue LMWH 40 qday until INR > 2. Herby Abraham, Pharm.D. [phone] 07/29/2012 12:27 PM

## 2012-07-29 NOTE — Progress Notes (Signed)
Physical Therapy Treatment Patient Details Name: Christine [name] MRN: [id number] DOB: October 24, 1951 Today's Date: 07/29/2012 Time: 1002-1026 PT Time Calculation (min): 24 min  PT Assessment / Plan / Recommendation Comments on Treatment Session  Pt continues to present with pain as limiting factor in treatment. Patient requires multiple rest breaks in order to complete tasks secondary to increased pain.     Follow Up Recommendations  Home health PT (vs continued rehab pending progress)    Barriers to Discharge        Equipment Recommendations       Recommendations for Other Services    Frequency 7X/week   Plan Discharge plan remains appropriate    Precautions / Restrictions Precautions Precautions: Posterior Hip Precaution Booklet Issued: Yes (comment) Precaution Comments: able to recall 3/3 Restrictions Weight Bearing Restrictions: Yes LLE Weight Bearing: Weight bearing as tolerated   Pertinent Vitals/Pain 8/10    Mobility  Bed Mobility Bed Mobility: Supine to Sit;Sitting - Scoot to Edge of Bed Supine to Sit: 4: Min assist;With rails;HOB flat Sitting - Scoot to [place] of Bed: 4: Min assist Transfers Transfers: Sit to Stand;Stand to Sit Sit to Stand: 4: Min guard;From bed Stand to Sit: 4: Min guard;To chair/3-in-1;With armrests Details for Transfer Assistance: Transfer to commode as well as chair, Req VCs for handplacement for all transfers Ambulation/Gait Ambulation/Gait Assistance: 4: Min guard Ambulation Distance (Feet): 20 Feet Assistive device: Rolling walker Gait Pattern: Step-to pattern;Decreased stride length;Antalgic Gait velocity: decreased      PT Goals Acute Rehab PT Goals PT Goal: Sit to Stand - Progress: Progressing toward goal (minimal progress) PT Goal: Ambulate - Progress: Progressing toward goal (minimal progress)  Visit Information  Last PT Received On: 07/29/12 Assistance Needed: +1    Subjective Data  Subjective: I'm in so much pain Patient  Stated Goal: to go home   Cognition  Overall Cognitive Status: Appears within functional limits for tasks assessed/performed Arousal/Alertness: Awake/alert Orientation Level: Appears intact for tasks assessed;Oriented X4 / Intact Behavior During Session: [facility] for tasks performed    Balance     End of Session PT - End of Session Equipment Utilized During Treatment: Gait belt Activity Tolerance: Patient limited by pain Patient left: in chair;with call bell/phone within reach Nurse Communication: Mobility status   GP     Fabio Asa 07/29/2012, 10:36 AM Charlotte Crumb, PT DPT  [phone]

## 2012-07-29 NOTE — Plan of Care (Signed)
Problem: Discharge Progression Outcomes Goal: Ambulates safely using assistive device Outcome: Progressing Pt still requires verbal cues for ambulating and negotiating turns while maintaining hip precautions.

## 2012-07-29 NOTE — Progress Notes (Signed)
Physical Therapy Treatment Patient Details Name: Christine [name] MRN: [id number] DOB: 02-Aug-1951 Today's Date: 07/29/2012 Time: 7829-5621 PT Time Calculation (min): 39 min  PT Assessment / Plan / Recommendation Comments on Treatment Session  Pt showed significant improvements in therapy this afternoon compared to prior sessions. Patient still requires max cues for safety and proper techniques for transfers and adhereing to precautions. Pt has not complete stair training at this time due to delayed progress but will do so next treatment session.     Follow Up Recommendations  Home health PT             Frequency 7X/week   Plan Discharge plan remains appropriate    Precautions / Restrictions Precautions Precautions: Posterior Hip Precaution Booklet Issued: Yes (comment) Precaution Comments: able to recall 3/3 Restrictions Weight Bearing Restrictions: Yes LLE Weight Bearing: Weight bearing as tolerated   Pertinent Vitals/Pain 6/10    Mobility  Bed Mobility Bed Mobility: Supine to Sit;Sitting - Scoot to Edge of Bed Supine to Sit: 4: Min assist;With rails;HOB flat Sitting - Scoot to [place] of Bed: 4: Min assist Details for Bed Mobility Assistance: Assist require for LLE; patient had difficulty with supine to sit  without a rail. Transfers Transfers: Sit to Stand;Stand to Sit Sit to Stand: From bed Stand to Sit: To bed Details for Transfer Assistance: Multiple attempts to perform with proper technique x 4 Ambulation/Gait Ambulation/Gait Assistance: 4: Min guard Ambulation Distance (Feet): 100 Feet Assistive device: Rolling walker Ambulation/Gait Assistance Details: VCs for turns and non pivoting Gait Pattern: Step-to pattern;Decreased stride length;Antalgic Gait velocity: decreased Stairs: No      PT Goals Acute Rehab PT Goals PT Goal Formulation: With patient Time For Goal Achievement: 08/02/12 Potential to Achieve Goals: Good Pt will go Sit to Stand: with modified  independence PT Goal: Sit to Stand - Progress: Progressing toward goal PT Goal: Ambulate - Progress: Progressing toward goal PT Goal: Up/Down Stairs - Progress: Other (comment) (Patient has not completed due to delayed progress)  Visit Information  Last PT Received On: 07/29/12 Assistance Needed: +1 PT/OT Co-Evaluation/Treatment: Yes    Subjective Data  Subjective: I am not going to a nursing home Patient Stated Goal: to go home   Cognition  Overall Cognitive Status: Appears within functional limits for tasks assessed/performed Arousal/Alertness: Awake/alert Orientation Level: Appears intact for tasks assessed;Oriented X4 / Intact Behavior During Session: Christine [name] for tasks performed       End of Session PT - End of Session Equipment Utilized During Treatment: Gait belt Activity Tolerance: Patient limited by pain Patient left: in chair;with call bell/phone within reach;with family/visitor present Nurse Communication: Mobility status   GP     Christine [name] 07/29/2012, 1:59 PM Christine [name], PT DPT  [phone]

## 2012-07-30 LAB — BASIC METABOLIC PANEL
CO2: 29 mEq/L (ref 19–32)
Calcium: 8.6 mg/dL (ref 8.4–10.5)
Creatinine, Ser: 0.68 mg/dL (ref 0.50–1.10)
GFR calc Af Amer: >90 mL/min (ref >90)
Sodium: 138 mEq/L (ref 135–145)

## 2012-07-30 LAB — CBC
MCH: 29.7 pg (ref 26.0–34.0)
MCV: 89.8 fL (ref 78.0–100.0)
Platelets: 255 10*3/uL (ref 150–400)
RBC: 3.13 MIL/uL — ABNORMAL LOW (ref 3.87–5.11)
RDW: 13.1 % (ref 11.5–15.5)
WBC: 8.8 10*3/uL (ref 4.0–10.5)

## 2012-07-30 LAB — PROTIME-INR: Prothrombin Time: 19.3 seconds — ABNORMAL HIGH (ref 11.6–15.2)

## 2012-07-30 NOTE — Progress Notes (Signed)
Physical Therapy Treatment Patient Details Name: Christine [name] MRN: [id number] DOB: 1951/02/03 Today's Date: 07/30/2012 Time: 4098-1191 PT Time Calculation (min): 19 min  PT Assessment / Plan / Recommendation Comments on Treatment Session  Tx session focused on stair training this AM.  Pt did well.  Performed 4 steps with Min Guard Assist.  Did not increase ambulation distance at this time due to pt c/o 7/10 pain & mild dizziness.      Follow Up Recommendations  Home health PT    Barriers to Discharge        Equipment Recommendations       Recommendations for Other Services    Frequency 7X/week   Plan Discharge plan remains appropriate    Precautions / Restrictions Precautions Precautions: Posterior Hip Precaution Comments: Pt verbalized 3/3 hip precautions.  Reinforcement cues with mobility more so with turns.   Restrictions LLE Weight Bearing: Weight bearing as tolerated    Pertinent Vitals/Pain 71/10 Lt Hip.  RN made aware.      Mobility  Bed Mobility Bed Mobility: Supine to Sit;Sitting - Scoot to Edge of Bed Supine to Sit: 4: Min assist;HOB flat Sitting - Scoot to [place] of Bed: 4: Min assist Details for Bed Mobility Assistance: Assist for LLE only.   Transfers Transfers: Sit to Stand;Stand to Sit Sit to Stand: 4: Min guard;With upper extremity assist;With armrests;From bed;From chair/3-in-1 Stand to Sit: With upper extremity assist;With armrests;To chair/3-in-1;5: Supervision Details for Transfer Assistance: Reinforcement cues for LLE positioning.   Ambulation/Gait Ambulation/Gait Assistance: 4: Min guard Ambulation Distance (Feet): 20 Feet Assistive device: Rolling walker Ambulation/Gait Assistance Details: Reinforcement cues for "No internal rotation" with pivotal turns & instructed to pivot on RLE ("good" LE).   Gait Pattern: Step-to pattern;Antalgic Stairs: Yes Stairs Assistance: 4: Min guard Stairs Assistance Details (indicate cue type and reason): cues for  sequencing & technique.   Stair Management Technique: Two rails;Step to pattern;Forwards Number of Stairs: 4  (2 + 2) Wheelchair Mobility Wheelchair Mobility: No      PT Goals Acute Rehab PT Goals Time For Goal Achievement: 08/02/12 Potential to Achieve Goals: Good PT Goal: Sit to Stand - Progress: Progressing toward goal PT Goal: Ambulate - Progress: Progressing toward goal PT Goal: Up/Down Stairs - Progress: Met  Visit Information  Last PT Received On: 07/30/12 Assistance Needed: +1    Subjective Data  Patient Stated Goal: to go home   Cognition  Overall Cognitive Status: Appears within functional limits for tasks assessed/performed Arousal/Alertness: Awake/alert Orientation Level: Appears intact for tasks assessed;Oriented X4 / Intact Behavior During Session: [facility] for tasks performed    Balance     End of Session PT - End of Session Equipment Utilized During Treatment: Gait belt Activity Tolerance: Patient tolerated treatment well;Patient limited by pain Patient left: in chair Nurse Communication: Mobility status     Verdell Face, [place] [phone] 07/30/2012

## 2012-07-30 NOTE — Progress Notes (Signed)
Patient ID: Christine [name], female   DOB: 1950-12-05, 61 y.o.   MRN: [id number] PATIENT ID: Christine [name]        MRN:  [id number]          DOB/AGE: October 05, 1951 / 61 y.o.  Christine Campbell, MD   Jacqualine Code, PA-C [address] [place], [place], [place]  [address]                             [phone]   PROGRESS NOTE  Subjective:  negative for Chest Pain  negative for Shortness of Breath  negative for Nausea/Vomiting   negative for Calf Pain  positive for Bowel Movement   Tolerating Diet: yes         Patient reports pain as mild.       Objective: Vital signs in last 24 hours:   Patient Vitals for the past 24 hrs:  BP Temp Pulse Resp SpO2  07/30/12 0800 - - - 16  99 %  07/30/12 0553 107/63 mmHg 99.6 F (37.6 C) 101  18  95 %  07/30/12 0400 - - - 18  95 %  07/30/12 0000 - - - 18  95 %  07/29/12 2127 111/64 mmHg 100.8 F (38.2 C) 102  16  98 %  07/29/12 2000 - - - 18  95 %  07/29/12 1400 113/73 mmHg 100.4 F (38 C) 112  16  98 %  07/29/12 1200 - - - 18  98 %      Intake/Output from previous day:   08/30 0701 - 08/31 0700 In: 840 [P.O.:840] Out: 650 [Urine:650]   Intake/Output this shift:       Intake/Output      08/30 0701 - 08/31 0700 08/31 0701 - 09/01 0700   P.O. 840    Total Intake 840    Urine 650    Total Output 650    Net +190         Urine Occurrence 1 x       LABORATORY DATA:  Basename 07/30/12 0519 07/29/12 0615 07/28/12 0610  WBC 8.8 9.3 8.7  HGB 9.3* 9.2* 9.5*  HCT 28.1* 27.9* 28.6*  PLT 255 238 247    Basename 07/30/12 0519 07/29/12 0615 07/28/12 0610  NA 138 138 138  K 2.9* 3.5 4.0  CL 102 100 103  CO2 29 29 30   BUN 4* 6 6  CREATININE 0.68 0.67 0.61  GLUCOSE 101* 108* 111*  CALCIUM 8.6 8.4 8.8   Lab Results  Component Value Date   INR 1.60* 07/30/2012   INR 1.27 07/29/2012   INR 1.06 07/28/2012    Recent Radiographic Studies :  Dg Chest 1 View  07/29/2012  *RADIOLOGY REPORT*  Clinical Data: Postop fever.  Postop from hip  arthroplasty.  CHEST - 1 VIEW  Comparison:  None.  Findings: The heart size and mediastinal contours are within normal limits.  Both lungs are clear.  IMPRESSION: No active disease.   Original Report Authenticated By: Danae Orleans, M.D.    Dg Hip Portable 1 View Left  07/27/2012  *RADIOLOGY REPORT*  Clinical Data: Postop left hip arthroplasty.  PORTABLE LEFT HIP - 1 VIEW  Comparison: None.  Findings: The patient is status post left hip arthroplasty. Subcutaneous and joint air are noted.  Surgical skin staples are in place.  Femoral stem appears well seated.  IMPRESSION: Left total hip arthroplasty with expected postoperative findings.  Original Report Authenticated By: Reyes Ivan, M.D.      Examination:  General appearance: alert and no distress  Wound Exam: clean, dry, intact   Drainage:  None: wound tissue dry  Motor Exam: EHL, FHL, Anterior Tibial and Posterior Tibial Intact  Sensory Exam: Superficial Peroneal, Deep Peroneal and Tibial normal  Vascular Exam: Normal  Assessment:    3 Days Post-Op  Procedure(s) (LRB): TOTAL HIP ARTHROPLASTY (Left)  ADDITIONAL DIAGNOSIS:  Active Problems:  * No active hospital problems. *      Plan: Physical Therapy as ordered Weight Bearing as Tolerated (WBAT)  DVT Prophylaxis:  Coumadin  DISCHARGE PLAN: Home  DISCHARGE NEEDS: has equipment at home    Lowered K today,but asymptomatic, comfortable, ready for D/C     Ronin Rehfeldt W 07/30/2012, 9:56 AM

## 2012-07-30 NOTE — Progress Notes (Signed)
ANTICOAGULATION CONSULT NOTE - Follow Up Consult  Pharmacy Consult for Coumadin Indication: THA  Allergies  Allergen Reactions  . Other Itching and Nausea And Vomiting    "to pain medications; usually moderate to severe".   . Dilaudid (Hydromorphone Hcl) Itching  . Sulfa Antibiotics Rash    Patient Measurements:    Weight:  76.8 kg  Vital Signs: Temp: 99.6 F (37.6 C) (08/31 0553) BP: 107/63 mmHg (08/31 0553) Pulse Rate: 101  (08/31 0553)  Labs:  Basename 07/30/12 0519 07/29/12 0615 07/28/12 0610  HGB 9.3* 9.2* --  HCT 28.1* 27.9* 28.6*  PLT 255 238 247  APTT -- -- --  LABPROT 19.3* 16.2* 14.0  INR 1.60* 1.27 1.06  HEPARINUNFRC -- -- --  CREATININE 0.68 0.67 0.61  CKTOTAL -- -- --  CKMB -- -- --  TROPONINI -- -- --    The CrCl is unknown because both a height and weight (above a minimum accepted value) are required for this calculation.  Assessment:  Coumadin for VTE prophylaxis s/p THA. INR 1.60 after 3 doses of coumadin 5 mg. Lovenox 40mg /d until INR>2.  H/H stable post-op.  No bleeding reported.  Coumadin score = 3. Goal of Therapy:  INR 2-3 Plan:  1. Continue coumadin 5 mg po daily 2. Continue LMWH 40 qday until INR > 2. Herby Abraham, Pharm.D. [phone] 07/30/2012 9:24 AM

## 2012-08-17 NOTE — Discharge Summary (Signed)
[name], [name]:  [id number]  MEDICAL RECORD NO.:  [id number]  LOCATION:  [id number]                        FACILITY:  [facility]  PHYSICIAN:  Loreta Ave, M.D. 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  HISTORY OF PRESENT ILLNESS:  A 61 year old white female with history of end-stage DJD left hip and chronic pain presented to our office for preop evaluation for left total hip replacement.  She had progressively worsening pain with failed response with conservative treatment. Significant decrease in her daily activities due to the ongoing complaint.  HOSPITAL COURSE:  On July 19, 2012, the patient was taken to the [facility] OR and a left total hip replacement procedure was performed.  SURGEON:  Loreta Ave, M.D.  ASSISTANT:  Genene Churn. Barry Dienes, PA-C  ANESTHESIA:  General.  BLOOD LOSS:  About 125 mL.  There are no surgical or anesthesia complications and the patient was transferred to recovery in stable condition.  All counts correct.  On July 28, 2012, the patient had good pain control but complained of itching from morphine PCA.  She has had allergic reaction to Percocet in the past.  Vital signs stable.  Afebrile.  Hemoglobin 9.5, INR 1.06. Dressing clean, dry, intact.  Calf nontender.  Neurovascularly intact. Skin warm and dry.  Discontinued morphine PCA and Foley.  Started Nucynta 75 mg p.o. q.4 hours p.r.n. for pain.  Pharmacy protocol, Coumadin, and Lovenox started for DVT prophylaxis.  PT, OT consults. July 29, 2012, the patient made slow progress with therapy due to pain which is better this morning.  Temp 99.6.  Hemoglobin 9.2, hematocrit 27.9, INR 1.27.  Hip wound looks good.  Staples intact.  No drainage or signs of infection.  Calf  nontender.  Neurovascularly intact.  Skin warm and dry.  Ordered chest x-ray to rule out atelectasis.  Chest x-ray later reviewed, no active disease.  July 30, 2012, the patient doing well and made good progress with therapy.  Hip wound looks good and staples intact.  No drainage or signs of infection.  Calf nontender. Neurovascularly intact.  Skin warm and dry.  The patient ready and stable for discharge home.  CONDITION:  Good and stable.  DISPOSITION:  Discharged home.  MEDICATIONS:  See detailed AVS.  INSTRUCTIONS:  While at home, the patient will continue to work with PT and OT to improve ambulation and strengthening.  Full weightbearing with walker and can wean to a cane as tolerated.  Coumadin until 4 weeks postop for DVT prophylaxis.  Okay to shower but no tub soaking.  Follow up in the office 2 weeks postop for recheck and staple removal.  Return sooner if needed.     Genene Churn. Denton Meek.   ______________________________ Loreta Ave, M.D.    JMO/MEDQ  D:  08/16/2012  T:  08/17/2012  Job:  [id number]

## 2013-02-23 ENCOUNTER — Other Ambulatory Visit (HOSPITAL_COMMUNITY): Payer: Self-pay | Admitting: Gastroenterology

## 2013-02-23 DIAGNOSIS — M79621 Pain in right upper arm: Secondary | ICD-10-CM

## 2013-02-27 ENCOUNTER — Ambulatory Visit (HOSPITAL_COMMUNITY)
Admission: RE | Admit: 2013-02-27 | Discharge: 2013-02-27 | Disposition: A | Discharge status: Home / Self Care | Payer: 59 | Source: Ambulatory Visit | Attending: Gastroenterology | Admitting: Gastroenterology

## 2013-02-27 DIAGNOSIS — M79621 Pain in right upper arm: Secondary | ICD-10-CM

## 2013-02-27 DIAGNOSIS — R229 Localized swelling, mass and lump, unspecified: Secondary | ICD-10-CM | POA: Insufficient documentation

## 2013-02-27 DIAGNOSIS — M19019 Primary osteoarthritis, unspecified shoulder: Secondary | ICD-10-CM | POA: Insufficient documentation

## 2013-02-27 LAB — CREATININE, SERUM
Creatinine, Ser: 0.7 mg/dL (ref 0.50–1.10)
GFR calc non Af Amer: >90 mL/min (ref >90)

## 2013-02-27 MED ORDER — GADOBENATE DIMEGLUMINE 529 MG/ML IV SOLN
15.0000 mL | Freq: Once | INTRAVENOUS | Status: AC | PRN
  Administered 2013-02-27: 15 mL via INTRAVENOUS

## 2013-12-21 ENCOUNTER — Other Ambulatory Visit: Payer: Self-pay | Admitting: Internal Medicine

## 2013-12-21 ENCOUNTER — Ambulatory Visit
Admission: RE | Admit: 2013-12-21 | Discharge: 2013-12-21 | Disposition: A | Discharge status: Home / Self Care | Payer: 59 | Source: Ambulatory Visit | Attending: Internal Medicine | Admitting: Internal Medicine

## 2013-12-21 DIAGNOSIS — R2681 Unsteadiness on feet: Secondary | ICD-10-CM

## 2014-07-04 ENCOUNTER — Other Ambulatory Visit: Payer: Self-pay | Admitting: Gastroenterology

## 2014-07-04 ENCOUNTER — Ambulatory Visit
Admission: RE | Admit: 2014-07-04 | Discharge: 2014-07-04 | Disposition: A | Discharge status: Home / Self Care | Payer: 59 | Source: Ambulatory Visit | Attending: Gastroenterology | Admitting: Gastroenterology

## 2014-07-04 DIAGNOSIS — R053 Chronic cough: Secondary | ICD-10-CM

## 2014-07-04 DIAGNOSIS — R05 Cough: Secondary | ICD-10-CM

## 2014-07-17 ENCOUNTER — Other Ambulatory Visit (HOSPITAL_COMMUNITY): Payer: Self-pay | Admitting: Orthopedic Surgery

## 2014-07-17 DIAGNOSIS — M25562 Pain in left knee: Secondary | ICD-10-CM

## 2014-07-23 ENCOUNTER — Encounter (HOSPITAL_COMMUNITY): Payer: Self-pay | Admitting: Pharmacist

## 2014-07-25 ENCOUNTER — Encounter (HOSPITAL_COMMUNITY)
Admission: RE | Admit: 2014-07-25 | Discharge: 2014-07-25 | Disposition: A | Discharge status: Home / Self Care | Payer: 59 | Source: Ambulatory Visit | Attending: Orthopedic Surgery | Admitting: Orthopedic Surgery

## 2014-07-25 ENCOUNTER — Ambulatory Visit (HOSPITAL_COMMUNITY)
Admission: RE | Admit: 2014-07-25 | Discharge: 2014-07-25 | Disposition: A | Discharge status: Home / Self Care | Payer: 59 | Source: Ambulatory Visit | Attending: Orthopedic Surgery | Admitting: Orthopedic Surgery

## 2014-07-25 DIAGNOSIS — Z96649 Presence of unspecified artificial hip joint: Secondary | ICD-10-CM | POA: Diagnosis not present

## 2014-07-25 DIAGNOSIS — M25562 Pain in left knee: Secondary | ICD-10-CM

## 2014-07-25 DIAGNOSIS — M25559 Pain in unspecified hip: Secondary | ICD-10-CM | POA: Diagnosis not present

## 2014-07-25 DIAGNOSIS — R937 Abnormal findings on diagnostic imaging of other parts of musculoskeletal system: Secondary | ICD-10-CM | POA: Insufficient documentation

## 2014-07-25 MED ORDER — TECHNETIUM TC 99M MEDRONATE IV KIT
25.0000 | PACK | Freq: Once | INTRAVENOUS | Status: AC | PRN
  Administered 2014-07-25: 25 via INTRAVENOUS

## 2014-08-17 ENCOUNTER — Institutional Professional Consult (permissible substitution): Payer: 59 | Admitting: Internal Medicine

## 2014-08-24 ENCOUNTER — Institutional Professional Consult (permissible substitution): Payer: 59 | Admitting: Internal Medicine

## 2014-10-02 ENCOUNTER — Ambulatory Visit (INDEPENDENT_AMBULATORY_CARE_PROVIDER_SITE_OTHER): Payer: 59 | Admitting: Internal Medicine

## 2014-10-02 ENCOUNTER — Other Ambulatory Visit: Payer: 59

## 2014-10-02 ENCOUNTER — Encounter: Payer: Self-pay | Admitting: Internal Medicine

## 2014-10-02 VITALS — BP 132/86 | HR 75 | Ht 65.0 in | Wt 175.0 lb

## 2014-10-02 DIAGNOSIS — R053 Chronic cough: Secondary | ICD-10-CM

## 2014-10-02 DIAGNOSIS — Z23 Encounter for immunization: Secondary | ICD-10-CM

## 2014-10-02 DIAGNOSIS — R059 Cough, unspecified: Secondary | ICD-10-CM

## 2014-10-02 DIAGNOSIS — R05 Cough: Secondary | ICD-10-CM

## 2014-10-02 MED ORDER — TIOTROPIUM BROMIDE MONOHYDRATE 2.5 MCG/ACT IN AERS: 2.0000 | INHALATION_SPRAY | Freq: Every day | RESPIRATORY_TRACT | Status: DC

## 2014-10-02 NOTE — Patient Instructions (Addendum)
Order- office spirometry    Dx chronic cough  Sample Spiriva Respimat inhaler    Inhale 2 puffs, once daily until the sample is used up  Order- lab- Allergy profile     Dx Chronic cough  Pneumovax-23

## 2014-10-02 NOTE — Progress Notes (Signed)
10/02/14- 14 yoF former smoker referred courtesy of Dr Earle Gell  for chronic cough since December 2014. She relates onset to a cold/bronchitis last winter. Has missed 11 days of work because of persistent cough since then. Has gone through several cycles lasting 8 weeks at a time, clear a few weeks in between. Never quite clears completely with antibiotics. Sputum white or yellow. Frontal headaches. Saw ENT in [place]-CT scans of sinuses and chest with his diagnosis blaming reflux. Dr. Wynetta Emery did barium swallow demonstrating some reflux. She saw no benefit from prolonged Prilosec and elevation of head of bed. She has currently been clear and well for the past 3 weeks, but that has been her pattern. She has never thought for self is a very allergic person except for ragweed in the fall. She has always had cats and dogs in her home. 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 She smoked off and on when she was young, stopping around 44. Family history of asthma allergies and heart disease.  Prior to Admission medications   Medication Sig Start Date End Date Taking? Authorizing Provider  cholecalciferol (VITAMIN D) 1000 UNITS tablet Take 3,000 Units by mouth daily.    Yes Historical Provider, MD  cyclobenzaprine (FLEXERIL) 10 MG tablet Take 10 mg by mouth daily as needed for muscle spasms.   Yes Historical Provider, MD  DULoxetine (CYMBALTA) 60 MG capsule Take 60 mg by mouth daily.     Yes Historical Provider, MD  estrogens, conjugated, (PREMARIN) 0.9 MG tablet Take 0.9 mg by mouth 2 (two) times daily.   Yes Historical Provider, MD  etodolac (LODINE) 500 MG tablet Take 500 mg by mouth 2 (two) times daily.   Yes Historical Provider, MD  guaiFENesin (ROBITUSSIN) 100 MG/5ML liquid Take 200 mg by mouth 3 (three) times daily as needed for cough.   Yes Historical Provider, MD  sodium chloride (OCEAN) 0.65 % SOLN nasal spray  Place 1 spray into both nostrils as needed for congestion.   Yes Historical Provider, MD  Tiotropium Bromide Monohydrate (SPIRIVA RESPIMAT) 2.5 MCG/ACT AERS Inhale 2 puffs into the lungs daily. 10/02/14   Deneise Lever, MD   Past Medical History  Diagnosis Date  . Blood transfusion 1970's    bleeding  after a fertility exploratory surgery   . GERD (gastroesophageal reflux disease)   . Depression   . Bronchitis     "once"  . Complication of anesthesia 1990's    "didn't have me totally asleep when they put ETT in; very traumatic"  . PONV (postoperative nausea and vomiting)   . High cholesterol   . Exertional dyspnea 07/27/2012    "had it checked out recently; qthing looks good"  . Anemia 2012    "slight"  . Arthritis     "q joint in my body; it's eating me up" (07/27/2012)  . Chronic lower back pain 07/27/2012    "they said I need another fusion right now"   Past Surgical History  Procedure Laterality Date  . Back surgery    . Total knee arthroplasty  04/13/2012    Procedure: TOTAL KNEE ARTHROPLASTY;  Surgeon: Ninetta Lights, MD;  Location: [facility];  Service: Orthopedics;  Laterality: Right;  DR MURPHY WANTS 90 MINUTES FOR THIS CASE  . Cesarean section  1987  . Hand surgery  ~ 1999    left thrumb  joint; "spur removal; rerouting of  tendon"  . Nasal septum surgery    . Total hip arthroplasty  07/27/2012    left  . Hammer toe surgery      right; "twice"  . Posterior fusion lumbar spine  ~ 2002  . Tonsillectomy and adenoidectomy      "as a child"  . Appendectomy  ` 1964  . Cholecystectomy  ~ 1990  . Vaginal hysterectomy  1989  . Dilation and curettage of uterus  ~ 1989  . Tubal ligation  ~ 1990  . Breast surgery      "total of 4 tumors removed; both breasts"  . Hernia repair  5732    umbilical  . Diagnostic laparoscopy  1970's    "checking fertility; checking everything out"  . Total hip arthroplasty  07/27/2012    Procedure: TOTAL HIP ARTHROPLASTY;  Surgeon: Ninetta Lights,  MD;  Location: [facility];  Service: Orthopedics;  Laterality: Left;  Left TOTAL HIP    Family History  Problem Relation Age of Onset  . Anesthesia problems Neg Hx    History   Social History  . Marital Status: Married    Spouse Name: N/A    Number of Children: N/A  . Years of Education: N/A   Occupational History  . chemical buyer    Social History Main Topics  . Smoking status: Former Smoker -- 0.12 packs/day for 3 years    Types: Cigarettes    Quit date: 11/30/1973  . Smokeless tobacco: Never Used  . Alcohol Use: 0.6 oz/week    1 Glasses of wine per week  . Drug Use: No  . Sexual Activity: Not Currently   Other Topics Concern  . Not on file   Social History Narrative   ROS-see HPI Constitutional:   No-   weight loss, night sweats, fevers, chills, fatigue, lassitude. HEENT:   +headaches, difficulty swallowing, tooth/dental problems, +sore throat,       No-  sneezing, +itching, +ear ache, nasal congestion, post nasal drip,  CV:  No-   chest pain, orthopnea, PND, swelling in lower extremities, anasarca,  dizziness, palpitations Resp: + shortness of breath with exertion or at rest.              + productive cough,  + non-productive cough,  No- coughing up of blood.              + change in color of mucus.  No- wheezing.   Skin: No-   rash or lesions. GI:  +heartburn,+ indigestion, abdominal pain, nausea, vomiting, diarrhea,                 change in bowel habits, loss of appetite GU: No-   dysuria, change in color of urine, no urgency or frequency.  No- flank pain. MS:  + joint pain or swelling.  No- decreased range of motion.  No- back pain. Neuro-     nothing unusual Psych:  No- change in mood or affect. No depression or anxiety.  No memory loss.  OBJ- Physical Exam General- Alert, Oriented, Affect-appropriate, Distress- none acute Skin- rash-none, lesions- none, excoriation- none Lymphadenopathy- none Head- atraumatic            Eyes- Gross vision intact, PERRLA,  conjunctivae and secretions clear            Ears- Hearing, canals-normal            Nose- Clear, no-Septal dev, mucus, polyps, erosion, perforation  Throat- Mallampati II , mucosa+red , drainage- none, tonsils- atrophic Neck- flexible , trachea midline, no stridor , thyroid nl, carotid no bruit Chest - symmetrical excursion , unlabored           Heart/CV- RRR , no murmur , no gallop  , no rub, nl s1 s2                           - JVD- none , edema- none, stasis changes- none, varices- none           Lung- clear to P&A, wheeze- none, cough+ dry with deep breath , dullness-none, rub- none           Chest wall-  Abd- tender-no, distended-no, bowel sounds-present, HSM- no Br/ Gen/ Rectal- Not done, not indicated Extrem- cyanosis- none, clubbing, none, atrophy- none, strength- nl Neuro- grossly intact to observation

## 2014-10-03 LAB — ALLERGY FULL PROFILE
Allergen,Goose feathers, e70: <0.1 kU/L
Bermuda Grass: <0.1 kU/L
Box Elder IgE: <0.1 kU/L
Candida Albicans: <0.1 kU/L
Curvularia lunata: <0.1 kU/L
Elm IgE: <0.1 kU/L
Fescue: <0.1 kU/L
G005 Rye, Perennial: <0.1 kU/L
G009 Red Top: <0.1 kU/L
IgE (Immunoglobulin E), Serum: <2 kU/L (ref <115)
Oak: <0.1 kU/L
Plantain: <0.1 kU/L
Stemphylium Botryosum: <0.1 kU/L
Timothy Grass: <0.1 kU/L

## 2014-10-07 DIAGNOSIS — R059 Cough, unspecified: Secondary | ICD-10-CM | POA: Insufficient documentation

## 2014-10-07 DIAGNOSIS — R05 Cough: Secondary | ICD-10-CM | POA: Insufficient documentation

## 2014-10-07 NOTE — Assessment & Plan Note (Signed)
Onset after an acute bronchitis raises questions of adult pertussis, post viral tracheobronchitis-which can be persistent. She may have developed upper airway cough syndrome irritable pharyngeal nerves. She lists acid heartburn and indigestion as symptoms, but indicates they are rare and she saw no benefit from reflux measures. Her posterior pharynx does look red. Watch for sinusitis because of headaches although she says CT scan was negative. We intended office spirometry today but the computers weren't working while she was here. Plan-anticipate spirometry, sample Spiriva Respimat, lab for allergy profile, pneumococcal vaccine

## 2014-12-06 ENCOUNTER — Ambulatory Visit: Payer: 59 | Admitting: Internal Medicine

## 2014-12-27 ENCOUNTER — Ambulatory Visit: Payer: Self-pay | Admitting: Orthopedic Surgery

## 2014-12-27 NOTE — H&P (Signed)
Christine [name] DOB: Aug 24, 1951 Married / Language: English / Race: White Female Date of Admission:  01/23/2015 CC:  Loose left total hip arhtroplasty History of Present Illness The patient is a 64 year old female who comes in for a preoperative History and Physical. The patient is scheduled for a left total hip arthroplasty (revision) to be performed by Dr. Dione Plover. Aluisio, MD at [facility] on 01-23-2015. The patient is a 64 year old female who presents with a hip problem. The patient reports left lateral hip problems including pain symptoms that have been present for 1 year(s). The symptoms began in association with an established activity (pain associated with steps). Symptoms reported include hip pain, stiffness, difficulty flexing hip and difficulty ambulating The patient reports symptoms radiating to the: left groin.The patient feels as if their symptoms are does feel they are worsening. Current treatment includes nonsteroidal anti-inflammatory drugs. Pertinent medical history includes total hip replacement (July 27, 2012 by Dr. Percell Miller). Prior to being seen, the patient was previously evaluated by a colleague. Previous workup for this problem has included hip x-rays. Previous treatment for this problem has included corticosteroid injection (troch bursa injection by Dr. Percell Miller 02/13/14, did not provide any relief). She says she did well initially with the hip replacement in 2013. She did not have any problems postoperatively with significant pain, swelling, warmth, redness, wound drainage, or infection. She said she rehabbed it well. Over the past year and a half to two years, however, she has started to develop pain in the left groin and left anterior thigh. It especially occurs with active flexion of the hip and getting up from a sitting position. In addition now, she is developing more weightbearing pain in the groin and anterior thigh. She has also had the lateral thigh pain. It is  hard for her to sleep at night on her left side. She generally can sleep well on the right side. She saw Dr. Percell Miller back in March of this year and had a trochanteric bursal injection, but unfortunately it did not help. She is subsequently here today for a second opinion. Symptoms reported include: pain. and report their pain level to be mild to moderate (occasionally severe, depending on activity). Current treatment includes: NSAIDs. She was sent for an MRI. The patient has not gotten any relief of their symptoms with Cortisone injections. She continues with the significant groin pain and significant difficulty flexing her hip. We obtained an MRI to rule out any kind of tendon tear or any kind of iliopsoas bursal fluid collection. She did end up having a lot of irregularity around the acetabulum. 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 Really looks like it on a plain film where the MRI does show some evidence of that too. The bone scan was negative, but she does have a screw in there, so she might not be having any motion in it to cause a positive bone scan. I recommend that we contemplate doing it an acetabular revision. We did discuss this in detail, procedural risks, potential complications and rehab course. She would like to go ahead and proceed with a revision of her acetabulum. They have been treated conservatively in the past for the above stated problem and despite conservative measures, they continue to have progressive pain and severe functional limitations and dysfunction. They have failed non-operative management including home exercise, medications, and injections. It is felt that they would benefit from undergoing revison total joint replacement. Risks and benefits  of the procedure have been discussed with the patient and they elect to proceed with surgery. There are no active contraindications to surgery such as ongoing infection or rapidly progressive neurological  disease.  Problem List/Past Medical  Arthralgia of left hip (M25.552) Status post total replacement of left hip (R42.706) Osteoarthritis Hypercholesterolemia Gastroesophageal Reflux Disease Chronic Pain Chronic Cystitis Anemia Depression Varicose veins Hemorrhoids Urinary Incontinence Urinary Tract Infection Past History Degenerative Disc Disease Measles Mumps Menopause Breast disease Fibrocystic Breast Disease  Allergies Sulfa Drugs Rash, Itching. Codeine Derivatives Nausea.  Family History  Depression mother Diabetes Mellitus sister Bleeding disorder sister Congestive Heart Failure father Cancer mother Rheumatoid Arthritis mother Heart Disease mother, father and sister Heart disease in female family member before age 65 Chronic Obstructive Lung Disease father Osteoarthritis father, sister and brother Osteoporosis father Hypertension father and sister  Social History  Marital status married Number of flights of stairs before winded less than 1 Drug/Alcohol Rehab (Previously) no Exercise Exercises rarely; does gym / weights Alcohol use current drinker; drinks wine; only occasionally per week Drug/Alcohol Rehab (Currently) no Children 2 Tobacco / smoke exposure no Tobacco use former smoker; smoke(d) less than 1/2 pack(s) per day Living situation live with spouse Current work status working full time Illicit drug use no 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 Etodolac (500MG  Tablet, Oral) Active. DULoxetine HCl (60MG  Capsule DR Part, Oral) Active. Cyclobenzaprine HCl (5MG  Tablet, Oral) Active. Vitamin D3 (Oral) Specific dose unknown - Active. Omeprazole (40MG  Capsule DR, Oral) Active. Magnesium (500MG  Tablet, Oral) Active.  Past Surgical History Mammoplasty; Reduction bilateral Hysterectomy  partial (non-cancerous) Gallbladder Surgery open Straighten Nasal Septum Spinal Surgery Spinal Fusion lower back Foot Surgery bilateral Breast Mass; Local Excision bilateral Breast Biopsy bilateral, multiple times Appendectomy Dilation and Curettage of Uterus Colon Polyp Removal - Colonoscopy Cesarean Delivery 1 time Total Knee Replacement right Total Hip Replacement left Tonsillectomy Tubal Ligation  Review of Systems  General Not Present- Chills, Fatigue, Fever, Memory Loss, Night Sweats, Weight Gain and Weight Loss. Skin Not Present- Eczema, Hives, Itching, Lesions and Rash. HEENT Not Present- Dentures, Double Vision, Headache, Hearing Loss, Tinnitus and Visual Loss. Respiratory Not Present- Allergies, Chronic Cough, Coughing up blood, Shortness of breath at rest and Shortness of breath with exertion. Cardiovascular Not Present- Chest Pain, Difficulty Breathing Lying Down, Murmur, Palpitations, Racing/skipping heartbeats and Swelling. Gastrointestinal Not Present- Abdominal Pain, Bloody Stool, Constipation, Diarrhea, Difficulty Swallowing, Heartburn, Jaundice, Loss of appetitie, Nausea and Vomiting. Female Genitourinary Present- Stress Incontinence and Urinating at Night. Not Present- Blood in Urine, Discharge, Flank Pain, Incontinence, Painful Urination, Urgency, Urinary frequency, Urinary Retention and Weak urinary stream. Musculoskeletal Present- Back Pain, Joint Pain, Joint Swelling, Morning Stiffness, Muscle Pain and Muscle Weakness. Not Present- Spasms. Neurological Not Present- Blackout spells, Difficulty with balance, Dizziness, Paralysis, Tremor and Weakness. Psychiatric Not Present- Insomnia.   Vitals  Weight: 177 lb Height: 63in Weight was reported by patient. Height was reported by patient. Body Surface Area: 1.84 m Body Mass Index: 31.35 kg/m  BP: 142/90 (Sitting, Left Arm, Standard)   Physical Exam General Mental Status -Alert,  cooperative and good historian. General Appearance-pleasant, Not in acute distress. Orientation-Oriented X3. Build & Nutrition-Well nourished and Well developed.  Head and Neck Head-normocephalic, atraumatic . Neck Global Assessment - supple, no bruit auscultated on the right, no bruit auscultated on the left.  Eye Pupil - Bilateral-Regular and Round. Motion - Bilateral-EOMI.  Chest and  Lung Exam Auscultation Breath sounds - clear at anterior chest wall and clear at posterior chest wall. Adventitious sounds - No Adventitious sounds.  Cardiovascular Auscultation Rhythm - Regular rate and rhythm. Heart Sounds - S1 WNL and S2 WNL. Murmurs & Other Heart Sounds - Auscultation of the heart reveals - No Murmurs.  Abdomen Palpation/Percussion Tenderness - Abdomen is non-tender to palpation. Rigidity (guarding) - Abdomen is soft. Auscultation Auscultation of the abdomen reveals - Bowel sounds normal.  Female Genitourinary Note: Not done, not pertinent to present illness   Musculoskeletal Note: She is in no distress. She will not actively flex the hip, but passively could flex her to 110. She is not having any pain on rotation. She is tender in the greater trochanter.  MRI scan and there appears to be a significant lucency between the acetabular shell and the acetabulum throughout the entire shell except around the screw. This corresponds to the findings on her plain film also.   Assessment & Plan  Status post total replacement of left hip (H06.237)  Note:Surgical Plans: Left Acetabular Revision versus Left Total Hip Revision  Disposition: Home  PCP: Dr. Earle Gell - Patient has been seen and felt to be stable for upcoming surgery.  IV TXA  Anesthesia Issues: One time she was not fully asleep and also had postop nausea.  Signed electronically by Joelene Millin, III PA-C

## 2015-01-04 ENCOUNTER — Ambulatory Visit: Payer: Self-pay | Admitting: Orthopedic Surgery

## 2015-01-04 NOTE — Progress Notes (Signed)
Preoperative surgical orders have been place into the Epic hospital system for Christine [name] on 01/04/2015, 12:23 PM  by Mickel Crow for surgery on 01-23-2015.  Preop Total Hip orders including Experel Injecion, IV Tylenol, and IV Decadron as long as there are no contraindications to the above medications. Arlee Muslim, PA-C

## 2015-01-15 NOTE — Patient Instructions (Addendum)
Christine [name]  01/15/2015   Your procedure is scheduled on: 01/20/15    Report to [facility] Main  Entrance and follow signs to               [place] at     0730 AM.  Call this number if you have problems the morning of surgery [phone]   Remember:  Do not eat food or drink liquids :After Midnight.     Take these medicines the morning of surgery with A SIP OF WATER: Cymbalta                                You may not have any metal on your body including hair pins and              piercings  Do not wear jewelry, make-up, lotions, powders or perfumes., deodorant.               Do not wear nail polish.  Do not shave  48 hours prior to surgery.               Do not bring valuables to the hospital. [place].  Contacts, dentures or bridgework may not be worn into surgery.  Leave suitcase in the car. After surgery it may be brought to your room.       :  Special Instructions: coughing and deep breathing exercises, leg exercises               Please read over the following fact sheets you were given: _____________________________________________________________________             [facility] - Preparing for Surgery Before surgery, you can play an important role.  Because skin is not sterile, your skin needs to be as free of germs as possible.  You can reduce the number of germs on your skin by washing with CHG (chlorahexidine gluconate) soap before surgery.  CHG is an antiseptic cleaner which kills germs and bonds with the skin to continue killing germs even after washing. Please DO NOT use if you have an allergy to CHG or antibacterial soaps.  If your skin becomes reddened/irritated stop using the CHG and inform your nurse when you arrive at Short Stay. Do not shave (including legs and underarms) for at least 48 hours prior to the first CHG shower.  You may shave your face/neck. Please follow  these instructions carefully:  1.  Shower with CHG Soap the night before surgery and the  morning of Surgery.  2.  If you choose to wash your hair, wash your hair first as usual with your  normal  shampoo.  3.  After you shampoo, rinse your hair and body thoroughly to remove the  shampoo.                           4.  Use CHG as you would any other liquid soap.  You can apply chg directly  to the skin and wash                       Gently with a scrungie or clean washcloth.  5.  Apply the CHG Soap to your body ONLY FROM THE NECK DOWN.   Do not use on face/ open                           Wound or open sores. Avoid contact with eyes, ears mouth and genitals (private parts).                       Wash face,  Genitals (private parts) with your normal soap.             6.  Wash thoroughly, paying special attention to the area where your surgery  will be performed.  7.  Thoroughly rinse your body with warm water from the neck down.  8.  DO NOT shower/wash with your normal soap after using and rinsing off  the CHG Soap.                9.  Pat yourself dry with a clean towel.            10.  Wear clean pajamas.            11.  Place clean sheets on your bed the night of your first shower and do not  sleep with pets. Day of Surgery : Do not apply any lotions/deodorants the morning of surgery.  Please wear clean clothes to the hospital/surgery center.  FAILURE TO FOLLOW THESE INSTRUCTIONS MAY RESULT IN THE CANCELLATION OF YOUR SURGERY PATIENT SIGNATURE_________________________________  NURSE SIGNATURE__________________________________  ________________________________________________________________________  WHAT IS A BLOOD TRANSFUSION? Blood Transfusion Information  A transfusion is the replacement of blood or some of its parts. Blood is made up of multiple cells which provide different functions.  Red blood cells carry oxygen and are used for blood loss replacement.  White blood cells fight  against infection.  Platelets control bleeding.  Plasma helps clot blood.  Other blood products are available for specialized needs, such as hemophilia or other clotting disorders. BEFORE THE TRANSFUSION  Who gives blood for transfusions?   Healthy volunteers who are fully evaluated to make sure their blood is safe. This is blood bank blood. Transfusion therapy is the safest it has ever been in the practice of medicine. Before blood is taken from a donor, a complete history is taken to make sure that person has no history of diseases nor engages in risky social behavior (examples are intravenous drug use or sexual activity with multiple partners). The donor's travel history is screened to minimize risk of transmitting infections, such as malaria. The donated blood is tested for signs of infectious diseases, such as HIV and hepatitis. The blood is then tested to be sure it is compatible with you in order to minimize the chance of a transfusion reaction. If you or a relative donates blood, this is often done in anticipation of surgery and is not appropriate for emergency situations. It takes many days to process the donated blood. RISKS AND COMPLICATIONS Although transfusion therapy is very safe and saves many lives, the main dangers of transfusion include:  1. Getting an infectious disease. 2. Developing a transfusion reaction. This is an allergic reaction to something in the blood you were given. Every precaution is taken to prevent this. The decision to have a blood transfusion has been considered carefully by your caregiver before blood is given. Blood is not given unless the benefits outweigh the risks. AFTER THE TRANSFUSION  Right after receiving a  blood transfusion, you will usually feel much better and more energetic. This is especially true if your red blood cells have gotten low (anemic). The transfusion raises the level of the red blood cells which carry oxygen, and this usually causes an  energy increase.  The nurse administering the transfusion will monitor you carefully for complications. HOME CARE INSTRUCTIONS  No special instructions are needed after a transfusion. You may find your energy is better. Speak with your caregiver about any limitations on activity for underlying diseases you may have. SEEK MEDICAL CARE IF:   Your condition is not improving after your transfusion.  You develop redness or irritation at the intravenous (IV) site. SEEK IMMEDIATE MEDICAL CARE IF:  Any of the following symptoms occur over the next 12 hours:  Shaking chills.  You have a temperature by mouth above 102 F (38.9 C), not controlled by medicine.  Chest, back, or muscle pain.  People around you feel you are not acting correctly or are confused.  Shortness of breath or difficulty breathing.  Dizziness and fainting.  You get a rash or develop hives.  You have a decrease in urine output.  Your urine turns a dark color or changes to pink, red, or brown. Any of the following symptoms occur over the next 10 days:  You have a temperature by mouth above 102 F (38.9 C), not controlled by medicine.  Shortness of breath.  Weakness after normal activity.  The white part of the eye turns yellow (jaundice).  You have a decrease in the amount of urine or are urinating less often.  Your urine turns a dark color or changes to pink, red, or brown. Document Released: 11/13/2000 Document Revised: 02/08/2012 Document Reviewed: 07/02/2008 ExitCare Patient Information 2014 [place].  _______________________________________________________________________  Incentive Spirometer  An incentive spirometer is a tool that can help keep your lungs clear and active. This tool measures how well you are filling your lungs with each breath. Taking long deep breaths may help reverse or decrease the chance of developing breathing (pulmonary) problems (especially infection) following:  A  long period of time when you are unable to move or be active. BEFORE THE PROCEDURE   If the spirometer includes an indicator to show your best effort, your nurse or respiratory therapist will set it to a desired goal.  If possible, sit up straight or lean slightly forward. Try not to slouch.  Hold the incentive spirometer in an upright position. INSTRUCTIONS FOR USE  3. Sit on the edge of your bed if possible, or sit up as far as you can in bed or on a chair. 4. Hold the incentive spirometer in an upright position. 5. Breathe out normally. 6. Place the mouthpiece in your mouth and seal your lips tightly around it. 7. Breathe in slowly and as deeply as possible, raising the piston or the ball toward the top of the column. 8. Hold your breath for 3-5 seconds or for as long as possible. Allow the piston or ball to fall to the bottom of the column. 9. Remove the mouthpiece from your mouth and breathe out normally. 10. Rest for a few seconds and repeat Steps 1 through 7 at least 10 times every 1-2 hours when you are awake. Take your time and take a few normal breaths between deep breaths. 11. The spirometer may include an indicator to show your best effort. Use the indicator as a goal to work toward during each repetition. 12. After each set of 10  deep breaths, practice coughing to be sure your lungs are clear. If you have an incision (the cut made at the time of surgery), support your incision when coughing by placing a pillow or rolled up towels firmly against it. Once you are able to get out of bed, walk around indoors and cough well. You may stop using the incentive spirometer when instructed by your caregiver.  RISKS AND COMPLICATIONS  Take your time so you do not get dizzy or light-headed.  If you are in pain, you may need to take or ask for pain medication before doing incentive spirometry. It is harder to take a deep breath if you are having pain. AFTER USE  Rest and breathe slowly and  easily.  It can be helpful to keep track of a log of your progress. Your caregiver can provide you with a simple table to help with this. If you are using the spirometer at home, follow these instructions: [place] IF:   You are having difficultly using the spirometer.  You have trouble using the spirometer as often as instructed.  Your pain medication is not giving enough relief while using the spirometer.  You develop fever of 100.5 F (38.1 C) or higher. SEEK IMMEDIATE MEDICAL CARE IF:   You cough up bloody sputum that had not been present before.  You develop fever of 102 F (38.9 C) or greater.  You develop worsening pain at or near the incision site. MAKE SURE YOU:   Understand these instructions.  Will watch your condition.  Will get help right away if you are not doing well or get worse. Document Released: 03/29/2007 Document Revised: 02/08/2012 Document Reviewed: 05/30/2007 [facility] Patient Information 2014 [place], [place].   ________________________________________________________________________

## 2015-01-16 ENCOUNTER — Encounter (HOSPITAL_COMMUNITY)
Admission: RE | Admit: 2015-01-16 | Discharge: 2015-01-16 | Disposition: A | Discharge status: Home / Self Care | Payer: 59 | Source: Ambulatory Visit | Attending: Orthopedic Surgery | Admitting: Orthopedic Surgery

## 2015-01-16 ENCOUNTER — Encounter (HOSPITAL_COMMUNITY): Payer: Self-pay

## 2015-01-16 DIAGNOSIS — T84098A Other mechanical complication of other internal joint prosthesis, initial encounter: Secondary | ICD-10-CM | POA: Insufficient documentation

## 2015-01-16 DIAGNOSIS — Z96649 Presence of unspecified artificial hip joint: Secondary | ICD-10-CM | POA: Diagnosis not present

## 2015-01-16 DIAGNOSIS — Z01818 Encounter for other preprocedural examination: Secondary | ICD-10-CM | POA: Insufficient documentation

## 2015-01-16 HISTORY — DX: Urinary tract infection, site not specified: N39.0

## 2015-01-16 LAB — URINALYSIS, ROUTINE W REFLEX MICROSCOPIC
GLUCOSE, UA: NEGATIVE mg/dL
Hgb urine dipstick: NEGATIVE
Ketones, ur: NEGATIVE mg/dL
Nitrite: POSITIVE — AB
PROTEIN: NEGATIVE mg/dL
Specific Gravity, Urine: 1.016 (ref 1.005–1.030)
Urobilinogen, UA: 0.2 mg/dL (ref 0.0–1.0)
pH: 6 (ref 5.0–8.0)

## 2015-01-16 LAB — COMPREHENSIVE METABOLIC PANEL
ALT: 15 U/L (ref 0–35)
ANION GAP: 8 (ref 5–15)
AST: 19 U/L (ref 0–37)
Albumin: 3.5 g/dL (ref 3.5–5.2)
Alkaline Phosphatase: 83 U/L (ref 39–117)
BILIRUBIN TOTAL: 0.5 mg/dL (ref 0.3–1.2)
BUN: 16 mg/dL (ref 6–23)
CHLORIDE: 105 mmol/L (ref 96–112)
CO2: 29 mmol/L (ref 19–32)
CREATININE: 0.72 mg/dL (ref 0.50–1.10)
Calcium: 8.9 mg/dL (ref 8.4–10.5)
GFR calc Af Amer: >90 mL/min (ref >90)
GFR calc non Af Amer: 89 mL/min — ABNORMAL LOW (ref >90)
Glucose, Bld: 96 mg/dL (ref 70–99)
Potassium: 3.8 mmol/L (ref 3.5–5.1)
Sodium: 142 mmol/L (ref 135–145)
Total Protein: 6.9 g/dL (ref 6.0–8.3)

## 2015-01-16 LAB — CBC
HCT: 35.7 % — ABNORMAL LOW (ref 36.0–46.0)
HEMOGLOBIN: 11.6 g/dL — AB (ref 12.0–15.0)
MCH: 30.1 pg (ref 26.0–34.0)
MCHC: 32.5 g/dL (ref 30.0–36.0)
MCV: 92.7 fL (ref 78.0–100.0)
PLATELETS: 291 10*3/uL (ref 150–400)
RBC: 3.85 MIL/uL — AB (ref 3.87–5.11)
RDW: 13.1 % (ref 11.5–15.5)
WBC: 6.1 10*3/uL (ref 4.0–10.5)

## 2015-01-16 LAB — PROTIME-INR
INR: 1.01 (ref 0.00–1.49)
Prothrombin Time: 13.4 seconds (ref 11.6–15.2)

## 2015-01-16 LAB — SURGICAL PCR SCREEN
MRSA, PCR: NEGATIVE
STAPHYLOCOCCUS AUREUS: NEGATIVE

## 2015-01-16 LAB — URINE MICROSCOPIC-ADD ON

## 2015-01-16 LAB — APTT: aPTT: 30 seconds (ref 24–37)

## 2015-01-16 NOTE — Progress Notes (Addendum)
10/12/2014 LOV with Dr Glynn Octave in [place] 07/04/2014 clearance on chart - signed on 10/18/14

## 2015-01-16 NOTE — Progress Notes (Signed)
U/A with micro results done 01/16/2015   faxed via EPIC to Dr Wynelle Link.

## 2015-01-17 LAB — ABO/RH: ABO/RH(D): B POS

## 2015-01-22 ENCOUNTER — Ambulatory Visit: Payer: Self-pay | Admitting: Orthopedic Surgery

## 2015-01-22 NOTE — H&P (Signed)
Christine [name] DOB: Oct 29, 1951 Married / Language: English / Race: White Female Date of Admission: 01/23/2015 CC: Loose left total hip arhtroplasty History of Present Illness The patient is a 64 year old female who comes in for a preoperative History and Physical. The patient is scheduled for a left total hip arthroplasty (revision) to be performed by Dr. Dione Plover. Aluisio, MD at [facility] on 01-23-2015. The patient is a 63 year old female who presents with a hip problem. The patient reports left lateral hip problems including pain symptoms that have been present for 1 year(s). The symptoms began in association with an established activity (pain associated with steps). Symptoms reported include hip pain, stiffness, difficulty flexing hip and difficulty ambulating The patient reports symptoms radiating to the: left groin.The patient feels as if their symptoms are does feel they are worsening. Current treatment includes nonsteroidal anti-inflammatory drugs. Pertinent medical history includes total hip replacement (July 27, 2012 by Dr. Percell Miller). Prior to being seen, the patient was previously evaluated by a colleague. Previous workup for this problem has included hip x-rays. Previous treatment for this problem has included corticosteroid injection (troch bursa injection by Dr. Percell Miller 02/13/14, did not provide any relief). She says she did well initially with the hip replacement in 2013. She did not have any problems postoperatively with significant pain, swelling, warmth, redness, wound drainage, or infection. She said she rehabbed it well. Over the past year and a half to two years, however, she has started to develop pain in the left groin and left anterior thigh. It especially occurs with active flexion of the hip and getting up from a sitting position. In addition now, she is developing more weightbearing pain in the groin and anterior thigh. She has also had the lateral thigh pain. It is  hard for her to sleep at night on her left side. She generally can sleep well on the right side. She saw Dr. Percell Miller back in March of this year and had a trochanteric bursal injection, but unfortunately it did not help. She is subsequently here today for a second opinion. Symptoms reported include: pain. and report their pain level to be mild to moderate (occasionally severe, depending on activity). Current treatment includes: NSAIDs. She was sent for an MRI. The patient has not gotten any relief of their symptoms with Cortisone injections. She continues with the significant groin pain and significant difficulty flexing her hip. We obtained an MRI to rule out any kind of tendon tear or any kind of iliopsoas bursal fluid collection. She did end up having a lot of irregularity around the acetabulum. 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 Really looks like it on a plain film where the MRI does show some evidence of that too. The bone scan was negative, but she does have a screw in there, so she might not be having any motion in it to cause a positive bone scan. I recommend that we contemplate doing it an acetabular revision. We did discuss this in detail, procedural risks, potential complications and rehab course. She would like to go ahead and proceed with a revision of her acetabulum. They have been treated conservatively in the past for the above stated problem and despite conservative measures, they continue to have progressive pain and severe functional limitations and dysfunction. They have failed non-operative management including home exercise, medications, and injections. It is felt that they would benefit from undergoing revison total joint replacement. Risks and benefits of the  procedure have been discussed with the patient and they elect to proceed with surgery. There are no active contraindications to surgery such as ongoing infection or rapidly progressive neurological  disease.  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 Codeine Derivatives Nausea.  Family History  Depression mother Diabetes Mellitus sister Bleeding disorder sister Congestive Heart Failure father Cancer mother Rheumatoid Arthritis mother Heart Disease mother, father and sister Heart disease in female family member before age 85 Chronic Obstructive Lung Disease father Osteoarthritis father, sister and brother Osteoporosis father Hypertension father and sister  Social History  Marital status married Number of flights of stairs before winded less than 1 Drug/Alcohol Rehab (Previously) no Exercise Exercises rarely; does gym / weights Alcohol use current drinker; drinks wine; only occasionally per week Drug/Alcohol Rehab (Currently) no Children 2 Tobacco / smoke exposure no Tobacco use former smoker; smoke(d) less than 1/2 pack(s) per day Living situation live with spouse Current work status working full time Illicit drug use no 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 Etodolac (500MG  Tablet, Oral) Active. DULoxetine HCl (60MG  Capsule DR Part, Oral) Active. Cyclobenzaprine HCl (5MG  Tablet, Oral) Active. Vitamin D3 (Oral) Specific dose unknown - Active. Omeprazole (40MG  Capsule DR, Oral) Active. Magnesium (500MG  Tablet, Oral) Active.  Past Surgical History Mammoplasty; Reduction  bilateral Hysterectomy partial (non-cancerous) Gallbladder Surgery open Straighten Nasal Septum Spinal Surgery Spinal Fusion lower back Foot Surgery bilateral Breast Mass; Local Excision bilateral Breast Biopsy bilateral, multiple times Appendectomy Dilation and Curettage of Uterus Colon Polyp Removal - Colonoscopy Cesarean Delivery 1 time Total Knee Replacement right Total Hip Replacement left Tonsillectomy Tubal Ligation  Review of Systems  General Not Present- Chills, Fatigue, Fever, Memory Loss, Night Sweats, Weight Gain and Weight Loss. Skin Not Present- Eczema, Hives, Itching, Lesions and Rash. HEENT Not Present- Dentures, Double Vision, Headache, Hearing Loss, Tinnitus and Visual Loss. Respiratory Not Present- Allergies, Chronic Cough, Coughing up blood, Shortness of breath at rest and Shortness of breath with exertion. Cardiovascular Not Present- Chest Pain, Difficulty Breathing Lying Down, Murmur, Palpitations, Racing/skipping heartbeats and Swelling. Gastrointestinal Not Present- Abdominal Pain, Bloody Stool, Constipation, Diarrhea, Difficulty Swallowing, Heartburn, Jaundice, Loss of appetitie, Nausea and Vomiting. Female Genitourinary Present- Stress Incontinence and Urinating at Night. Not Present- Blood in Urine, Discharge, Flank Pain, Incontinence, Painful Urination, Urgency, Urinary frequency, Urinary Retention and Weak urinary stream. Musculoskeletal Present- Back Pain, Joint Pain, Joint Swelling, Morning Stiffness, Muscle Pain and Muscle Weakness. Not Present- Spasms. Neurological Not Present- Blackout spells, Difficulty with balance, Dizziness, Paralysis, Tremor and Weakness. Psychiatric Not Present- Insomnia.   Vitals  Weight: 177 lb Height: 63in Weight was reported by patient. Height was reported by patient. Body Surface Area: 1.84 m Body Mass Index: 31.35 kg/m  BP: 142/90 (Sitting, Left Arm, Standard)   Physical  Exam General Mental Status -Alert, cooperative and good historian. General Appearance-pleasant, Not in acute distress. Orientation-Oriented X3. Build & Nutrition-Well nourished and Well developed.  Head and Neck Head-normocephalic, atraumatic . Neck Global Assessment - supple, no bruit auscultated on the right, no bruit auscultated on the left.  Eye Pupil - Bilateral-Regular and Round. Motion - Bilateral-EOMI.  Chest and Lung Exam  Auscultation Breath sounds - clear at anterior chest wall and clear at posterior chest wall. Adventitious sounds - No Adventitious sounds.  Cardiovascular Auscultation Rhythm - Regular rate and rhythm. Heart Sounds - S1 WNL and S2 WNL. Murmurs & Other Heart Sounds - Auscultation of the heart reveals - No Murmurs.  Abdomen Palpation/Percussion Tenderness - Abdomen is non-tender to palpation. Rigidity (guarding) - Abdomen is soft. Auscultation Auscultation of the abdomen reveals - Bowel sounds normal.  Female Genitourinary Note: Not done, not pertinent to present illness   Musculoskeletal Note: She is in no distress. She will not actively flex the hip, but passively could flex her to 110. She is not having any pain on rotation. She is tender in the greater trochanter.  MRI scan and there appears to be a significant lucency between the acetabular shell and the acetabulum throughout the entire shell except around the screw. This corresponds to the findings on her plain film also.   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 Earle Gell - Patient has been seen and felt to be stable for upcoming surgery.  IV TXA  Anesthesia Issues: One time she was not fully asleep and also had postop nausea.  Signed electronically by Joelene Millin, III PA-C

## 2015-01-22 NOTE — H&P (Signed)
Christine [name] DOB: Apr 06, 1951 Married / Language: English / Race: White Female Date of Admission: 01/23/2015 CC: Loose left total hip arhtroplasty History of Present Illness The patient is a 64 year old female who comes in for a preoperative History and Physical. The patient is scheduled for a left total hip arthroplasty (revision) to be performed by Dr. Dione Plover. Aluisio, MD at [facility] on 01-23-2015. The patient is a 64 year old female who presents with a hip problem. The patient reports left lateral hip problems including pain symptoms that have been present for 1 year(s). The symptoms began in association with an established activity (pain associated with steps). Symptoms reported include hip pain, stiffness, difficulty flexing hip and difficulty ambulating The patient reports symptoms radiating to the: left groin.The patient feels as if their symptoms are does feel they are worsening. Current treatment includes nonsteroidal anti-inflammatory drugs. Pertinent medical history includes total hip replacement (July 27, 2012 by Dr. Percell Miller). Prior to being seen, the patient was previously evaluated by a colleague. Previous workup for this problem has included hip x-rays. Previous treatment for this problem has included corticosteroid injection (troch bursa injection by Dr. Percell Miller 02/13/14, did not provide any relief). She says she did well initially with the hip replacement in 2013. She did not have any problems postoperatively with significant pain, swelling, warmth, redness, wound drainage, or infection. She said she rehabbed it well. Over the past year and a half to two years, however, she has started to develop pain in the left groin and left anterior thigh. It especially occurs with active flexion of the hip and getting up from a sitting position. In addition now, she is developing more weightbearing pain in the groin and anterior thigh. She has also had the lateral thigh pain. It is  hard for her to sleep at night on her left side. She generally can sleep well on the right side. She saw Dr. Percell Miller back in March of this year and had a trochanteric bursal injection, but unfortunately it did not help. She is subsequently here today for a second opinion. Symptoms reported include: pain. and report their pain level to be mild to moderate (occasionally severe, depending on activity). Current treatment includes: NSAIDs. She was sent for an MRI. The patient has not gotten any relief of their symptoms with Cortisone injections. She continues with the significant groin pain and significant difficulty flexing her hip. We obtained an MRI to rule out any kind of tendon tear or any kind of iliopsoas bursal fluid collection. She did end up having a lot of irregularity around the acetabulum. 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 Really looks like it on a plain film where the MRI does show some evidence of that too. The bone scan was negative, but she does have a screw in there, so she might not be having any motion in it to cause a positive bone scan. I recommend that we contemplate doing it an acetabular revision. We did discuss this in detail, procedural risks, potential complications and rehab course. She would like to go ahead and proceed with a revision of her acetabulum. They have been treated conservatively in the past for the above stated problem and despite conservative measures, they continue to have progressive pain and severe functional limitations and dysfunction. They have failed non-operative management including home exercise, medications, and injections. It is felt that they would benefit from undergoing revison total joint replacement. Risks and benefits of the  procedure have been discussed with the patient and they elect to proceed with surgery. There are no active contraindications to surgery such as ongoing infection or rapidly progressive neurological  disease.  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 Codeine Derivatives Nausea.  Family History  Depression mother Diabetes Mellitus sister Bleeding disorder sister Congestive Heart Failure father Cancer mother Rheumatoid Arthritis mother Heart Disease mother, father and sister Heart disease in female family member before age 57 Chronic Obstructive Lung Disease father Osteoarthritis father, sister and brother Osteoporosis father Hypertension father and sister  Social History  Marital status married Number of flights of stairs before winded less than 1 Drug/Alcohol Rehab (Previously) no Exercise Exercises rarely; does gym / weights Alcohol use current drinker; drinks wine; only occasionally per week Drug/Alcohol Rehab (Currently) no Children 2 Tobacco / smoke exposure no Tobacco use former smoker; smoke(d) less than 1/2 pack(s) per day Living situation live with spouse Current work status working full time Illicit drug use no 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 Etodolac (500MG  Tablet, Oral) Active. DULoxetine HCl (60MG  Capsule DR Part, Oral) Active. Cyclobenzaprine HCl (5MG  Tablet, Oral) Active. Vitamin D3 (Oral) Specific dose unknown - Active. Omeprazole (40MG  Capsule DR, Oral) Active. Magnesium (500MG  Tablet, Oral) Active.  Past Surgical History Mammoplasty; Reduction  bilateral Hysterectomy partial (non-cancerous) Gallbladder Surgery open Straighten Nasal Septum Spinal Surgery Spinal Fusion lower back Foot Surgery bilateral Breast Mass; Local Excision bilateral Breast Biopsy bilateral, multiple times Appendectomy Dilation and Curettage of Uterus Colon Polyp Removal - Colonoscopy Cesarean Delivery 1 time Total Knee Replacement right Total Hip Replacement left Tonsillectomy Tubal Ligation  Review of Systems  General Not Present- Chills, Fatigue, Fever, Memory Loss, Night Sweats, Weight Gain and Weight Loss. Skin Not Present- Eczema, Hives, Itching, Lesions and Rash. HEENT Not Present- Dentures, Double Vision, Headache, Hearing Loss, Tinnitus and Visual Loss. Respiratory Not Present- Allergies, Chronic Cough, Coughing up blood, Shortness of breath at rest and Shortness of breath with exertion. Cardiovascular Not Present- Chest Pain, Difficulty Breathing Lying Down, Murmur, Palpitations, Racing/skipping heartbeats and Swelling. Gastrointestinal Not Present- Abdominal Pain, Bloody Stool, Constipation, Diarrhea, Difficulty Swallowing, Heartburn, Jaundice, Loss of appetitie, Nausea and Vomiting. Female Genitourinary Present- Stress Incontinence and Urinating at Night. Not Present- Blood in Urine, Discharge, Flank Pain, Incontinence, Painful Urination, Urgency, Urinary frequency, Urinary Retention and Weak urinary stream. Musculoskeletal Present- Back Pain, Joint Pain, Joint Swelling, Morning Stiffness, Muscle Pain and Muscle Weakness. Not Present- Spasms. Neurological Not Present- Blackout spells, Difficulty with balance, Dizziness, Paralysis, Tremor and Weakness. Psychiatric Not Present- Insomnia.   Vitals  Weight: 177 lb Height: 63in Weight was reported by patient. Height was reported by patient. Body Surface Area: 1.84 m Body Mass Index: 31.35 kg/m  BP: 142/90 (Sitting, Left Arm, Standard)   Physical  Exam General Mental Status -Alert, cooperative and good historian. General Appearance-pleasant, Not in acute distress. Orientation-Oriented X3. Build & Nutrition-Well nourished and Well developed.  Head and Neck Head-normocephalic, atraumatic . Neck Global Assessment - supple, no bruit auscultated on the right, no bruit auscultated on the left.  Eye Pupil - Bilateral-Regular and Round. Motion - Bilateral-EOMI.  Chest and Lung Exam  Auscultation Breath sounds - clear at anterior chest wall and clear at posterior chest wall. Adventitious sounds - No Adventitious sounds.  Cardiovascular Auscultation Rhythm - Regular rate and rhythm. Heart Sounds - S1 WNL and S2 WNL. Murmurs & Other Heart Sounds - Auscultation of the heart reveals - No Murmurs.  Abdomen Palpation/Percussion Tenderness - Abdomen is non-tender to palpation. Rigidity (guarding) - Abdomen is soft. Auscultation Auscultation of the abdomen reveals - Bowel sounds normal.  Female Genitourinary Note: Not done, not pertinent to present illness   Musculoskeletal Note: She is in no distress. She will not actively flex the hip, but passively could flex her to 110. She is not having any pain on rotation. She is tender in the greater trochanter.  MRI scan and there appears to be a significant lucency between the acetabular shell and the acetabulum throughout the entire shell except around the screw. This corresponds to the findings on her plain film also.   Assessment & Plan  Status post total replacement of left hip (O75.643)  Note:Surgical Plans: Left Acetabular Revision versus Left Total Hip Revision  Disposition: Home  PCP: Dr. Earle Gell - Patient has been seen and felt to be stable for upcoming surgery.  IV TXA  Anesthesia Issues: One time she was not fully asleep and also had postop nausea.  Signed electronically by Joelene Millin, III PA-C

## 2015-01-23 ENCOUNTER — Inpatient Hospital Stay (HOSPITAL_COMMUNITY): Payer: 59

## 2015-01-23 ENCOUNTER — Encounter (HOSPITAL_COMMUNITY)
Admission: RE | Disposition: A | Discharge status: Home / Self Care | Payer: Self-pay | Source: Ambulatory Visit | Attending: Orthopedic Surgery

## 2015-01-23 ENCOUNTER — Encounter (HOSPITAL_COMMUNITY): Payer: Self-pay | Admitting: *Deleted

## 2015-01-23 ENCOUNTER — Inpatient Hospital Stay (HOSPITAL_COMMUNITY)
Admission: RE | Admit: 2015-01-23 | Discharge: 2015-01-25 | DRG: 468 | Disposition: A | Discharge status: Home / Self Care | Payer: 59 | Source: Ambulatory Visit | Attending: Orthopedic Surgery | Admitting: Orthopedic Surgery

## 2015-01-23 ENCOUNTER — Inpatient Hospital Stay (HOSPITAL_COMMUNITY): Payer: 59 | Admitting: Anesthesiology

## 2015-01-23 DIAGNOSIS — Z96651 Presence of right artificial knee joint: Secondary | ICD-10-CM | POA: Diagnosis present

## 2015-01-23 DIAGNOSIS — T84031A Mechanical loosening of internal left hip prosthetic joint, initial encounter: Principal | ICD-10-CM | POA: Diagnosis present

## 2015-01-23 DIAGNOSIS — Z87891 Personal history of nicotine dependence: Secondary | ICD-10-CM

## 2015-01-23 DIAGNOSIS — N3020 Other chronic cystitis without hematuria: Secondary | ICD-10-CM | POA: Diagnosis present

## 2015-01-23 DIAGNOSIS — E780 Pure hypercholesterolemia: Secondary | ICD-10-CM | POA: Diagnosis present

## 2015-01-23 DIAGNOSIS — F329 Major depressive disorder, single episode, unspecified: Secondary | ICD-10-CM | POA: Diagnosis present

## 2015-01-23 DIAGNOSIS — Z981 Arthrodesis status: Secondary | ICD-10-CM

## 2015-01-23 DIAGNOSIS — D649 Anemia, unspecified: Secondary | ICD-10-CM | POA: Diagnosis present

## 2015-01-23 DIAGNOSIS — Z96649 Presence of unspecified artificial hip joint: Secondary | ICD-10-CM

## 2015-01-23 DIAGNOSIS — M25552 Pain in left hip: Secondary | ICD-10-CM | POA: Diagnosis present

## 2015-01-23 DIAGNOSIS — G8929 Other chronic pain: Secondary | ICD-10-CM | POA: Diagnosis present

## 2015-01-23 DIAGNOSIS — K219 Gastro-esophageal reflux disease without esophagitis: Secondary | ICD-10-CM | POA: Diagnosis present

## 2015-01-23 DIAGNOSIS — M169 Osteoarthritis of hip, unspecified: Secondary | ICD-10-CM | POA: Diagnosis present

## 2015-01-23 DIAGNOSIS — Y792 Prosthetic and other implants, materials and accessory orthopedic devices associated with adverse incidents: Secondary | ICD-10-CM | POA: Diagnosis present

## 2015-01-23 DIAGNOSIS — T84018A Broken internal joint prosthesis, other site, initial encounter: Secondary | ICD-10-CM

## 2015-01-23 HISTORY — PX: TOTAL HIP REVISION: SHX763

## 2015-01-23 LAB — TYPE AND SCREEN
ABO/RH(D): B POS
ANTIBODY SCREEN: NEGATIVE

## 2015-01-23 SURGERY — TOTAL HIP REVISION
Anesthesia: Spinal | Site: Hip | Laterality: Left

## 2015-01-23 MED ORDER — PROPOFOL 10 MG/ML IV BOLUS
INTRAVENOUS | Status: AC
  Filled 2015-01-23: qty 20

## 2015-01-23 MED ORDER — ACETAMINOPHEN 10 MG/ML IV SOLN
1000.0000 mg | Freq: Once | INTRAVENOUS | Status: AC
  Administered 2015-01-23: 1000 mg via INTRAVENOUS
  Filled 2015-01-23: qty 100

## 2015-01-23 MED ORDER — SCOPOLAMINE 1 MG/3DAYS TD PT72
MEDICATED_PATCH | TRANSDERMAL | Status: DC | PRN
  Administered 2015-01-23: 1 via TRANSDERMAL

## 2015-01-23 MED ORDER — SODIUM CHLORIDE 0.9 % IJ SOLN
INTRAMUSCULAR | Status: AC
  Filled 2015-01-23: qty 50

## 2015-01-23 MED ORDER — ACETAMINOPHEN 500 MG PO TABS
1000.0000 mg | ORAL_TABLET | Freq: Four times a day (QID) | ORAL | Status: AC
  Administered 2015-01-23 – 2015-01-24 (×4): 1000 mg via ORAL
  Filled 2015-01-23 (×4): qty 2

## 2015-01-23 MED ORDER — POLYETHYLENE GLYCOL 3350 17 G PO PACK: 17.0000 g | PACK | Freq: Every day | ORAL | Status: DC | PRN

## 2015-01-23 MED ORDER — MIDAZOLAM HCL 5 MG/5ML IJ SOLN
INTRAMUSCULAR | Status: DC | PRN
  Administered 2015-01-23: 2 mg via INTRAVENOUS

## 2015-01-23 MED ORDER — BUPIVACAINE LIPOSOME 1.3 % IJ SUSP
INTRAMUSCULAR | Status: DC | PRN
  Administered 2015-01-23: 20 mL

## 2015-01-23 MED ORDER — PHENOL 1.4 % MT LIQD
1.0000 | OROMUCOSAL | Status: DC | PRN
  Filled 2015-01-23: qty 177

## 2015-01-23 MED ORDER — FENTANYL CITRATE 0.05 MG/ML IJ SOLN: 25.0000 ug | INTRAMUSCULAR | Status: DC | PRN

## 2015-01-23 MED ORDER — ACETAMINOPHEN 325 MG PO TABS
650.0000 mg | ORAL_TABLET | Freq: Four times a day (QID) | ORAL | Status: DC | PRN
  Administered 2015-01-24 – 2015-01-25 (×2): 650 mg via ORAL
  Filled 2015-01-23 (×2): qty 2

## 2015-01-23 MED ORDER — CEFAZOLIN SODIUM-DEXTROSE 2-3 GM-% IV SOLR
2.0000 g | Freq: Four times a day (QID) | INTRAVENOUS | Status: AC
  Administered 2015-01-23 (×2): 2 g via INTRAVENOUS
  Filled 2015-01-23 (×2): qty 50

## 2015-01-23 MED ORDER — CEFAZOLIN SODIUM-DEXTROSE 2-3 GM-% IV SOLR
INTRAVENOUS | Status: AC
  Filled 2015-01-23: qty 50

## 2015-01-23 MED ORDER — METOCLOPRAMIDE HCL 10 MG PO TABS: 5.0000 mg | ORAL_TABLET | Freq: Three times a day (TID) | ORAL | Status: DC | PRN

## 2015-01-23 MED ORDER — ACETAMINOPHEN 650 MG RE SUPP: 650.0000 mg | Freq: Four times a day (QID) | RECTAL | Status: DC | PRN

## 2015-01-23 MED ORDER — PROMETHAZINE HCL 25 MG/ML IJ SOLN: 6.2500 mg | INTRAMUSCULAR | Status: DC | PRN

## 2015-01-23 MED ORDER — BUPIVACAINE LIPOSOME 1.3 % IJ SUSP
20.0000 mL | Freq: Once | INTRAMUSCULAR | Status: DC
  Filled 2015-01-23: qty 20

## 2015-01-23 MED ORDER — SODIUM CHLORIDE 0.9 % IV SOLN: INTRAVENOUS | Status: DC

## 2015-01-23 MED ORDER — BUPIVACAINE HCL (PF) 0.5 % IJ SOLN
INTRAMUSCULAR | Status: DC | PRN
  Administered 2015-01-23: 3 mL

## 2015-01-23 MED ORDER — DULOXETINE HCL 60 MG PO CPEP
60.0000 mg | ORAL_CAPSULE | Freq: Every morning | ORAL | Status: DC
  Administered 2015-01-24 – 2015-01-25 (×2): 60 mg via ORAL
  Filled 2015-01-23 (×2): qty 1

## 2015-01-23 MED ORDER — METHOCARBAMOL 500 MG PO TABS
500.0000 mg | ORAL_TABLET | Freq: Four times a day (QID) | ORAL | Status: DC | PRN
  Administered 2015-01-24: 500 mg via ORAL
  Filled 2015-01-23: qty 1

## 2015-01-23 MED ORDER — MEPERIDINE HCL 50 MG/ML IJ SOLN: 6.2500 mg | INTRAMUSCULAR | Status: DC | PRN

## 2015-01-23 MED ORDER — OXYCODONE HCL 5 MG PO TABS
5.0000 mg | ORAL_TABLET | ORAL | Status: DC | PRN
  Administered 2015-01-23 (×2): 10 mg via ORAL
  Administered 2015-01-24 – 2015-01-25 (×3): 5 mg via ORAL
  Filled 2015-01-23: qty 1
  Filled 2015-01-23 (×2): qty 2
  Filled 2015-01-23 (×2): qty 1

## 2015-01-23 MED ORDER — SCOPOLAMINE 1 MG/3DAYS TD PT72
MEDICATED_PATCH | TRANSDERMAL | Status: AC
  Filled 2015-01-23: qty 1

## 2015-01-23 MED ORDER — 0.9 % SODIUM CHLORIDE (POUR BTL) OPTIME
TOPICAL | Status: DC | PRN
  Administered 2015-01-23: 1000 mL

## 2015-01-23 MED ORDER — MENTHOL 3 MG MT LOZG
1.0000 | LOZENGE | OROMUCOSAL | Status: DC | PRN
  Filled 2015-01-23: qty 9

## 2015-01-23 MED ORDER — LACTATED RINGERS IV SOLN: INTRAVENOUS | Status: DC

## 2015-01-23 MED ORDER — KETOROLAC TROMETHAMINE 15 MG/ML IJ SOLN
INTRAMUSCULAR | Status: AC
  Filled 2015-01-23: qty 1

## 2015-01-23 MED ORDER — RIVAROXABAN 10 MG PO TABS
10.0000 mg | ORAL_TABLET | Freq: Every day | ORAL | Status: DC
  Administered 2015-01-24 – 2015-01-25 (×2): 10 mg via ORAL
  Filled 2015-01-23 (×3): qty 1

## 2015-01-23 MED ORDER — ONDANSETRON HCL 4 MG/2ML IJ SOLN: 4.0000 mg | Freq: Four times a day (QID) | INTRAMUSCULAR | Status: DC | PRN

## 2015-01-23 MED ORDER — ONDANSETRON HCL 4 MG PO TABS
4.0000 mg | ORAL_TABLET | Freq: Four times a day (QID) | ORAL | Status: DC | PRN
  Administered 2015-01-24: 4 mg via ORAL
  Filled 2015-01-23: qty 1

## 2015-01-23 MED ORDER — DOCUSATE SODIUM 100 MG PO CAPS
100.0000 mg | ORAL_CAPSULE | Freq: Two times a day (BID) | ORAL | Status: DC
  Administered 2015-01-23 – 2015-01-25 (×4): 100 mg via ORAL

## 2015-01-23 MED ORDER — LIDOCAINE HCL (CARDIAC) 20 MG/ML IV SOLN
INTRAVENOUS | Status: AC
  Filled 2015-01-23: qty 5

## 2015-01-23 MED ORDER — SCOPOLAMINE 1 MG/3DAYS TD PT72
1.0000 | MEDICATED_PATCH | Freq: Once | TRANSDERMAL | Status: DC
  Administered 2015-01-23: 1.5 mg via TRANSDERMAL
  Filled 2015-01-23: qty 1

## 2015-01-23 MED ORDER — TRANEXAMIC ACID 100 MG/ML IV SOLN
1000.0000 mg | INTRAVENOUS | Status: AC
  Administered 2015-01-23: 1000 mg via INTRAVENOUS
  Filled 2015-01-23: qty 10

## 2015-01-23 MED ORDER — CHLORHEXIDINE GLUCONATE 4 % EX LIQD: 60.0000 mL | Freq: Once | CUTANEOUS | Status: DC

## 2015-01-23 MED ORDER — DIPHENHYDRAMINE HCL 12.5 MG/5ML PO ELIX: 12.5000 mg | ORAL_SOLUTION | ORAL | Status: DC | PRN

## 2015-01-23 MED ORDER — FENTANYL CITRATE 0.05 MG/ML IJ SOLN
INTRAMUSCULAR | Status: AC
  Filled 2015-01-23: qty 2

## 2015-01-23 MED ORDER — BUPIVACAINE HCL (PF) 0.5 % IJ SOLN
INTRAMUSCULAR | Status: AC
  Filled 2015-01-23: qty 30

## 2015-01-23 MED ORDER — DEXAMETHASONE SODIUM PHOSPHATE 10 MG/ML IJ SOLN
10.0000 mg | Freq: Once | INTRAMUSCULAR | Status: AC
  Administered 2015-01-23: 10 mg via INTRAVENOUS

## 2015-01-23 MED ORDER — KETOROLAC TROMETHAMINE 15 MG/ML IJ SOLN
7.5000 mg | Freq: Four times a day (QID) | INTRAMUSCULAR | Status: AC | PRN
  Administered 2015-01-23: 7.5 mg via INTRAVENOUS

## 2015-01-23 MED ORDER — ROCURONIUM BROMIDE 100 MG/10ML IV SOLN
INTRAVENOUS | Status: AC
  Filled 2015-01-23: qty 1

## 2015-01-23 MED ORDER — BUPIVACAINE HCL (PF) 0.25 % IJ SOLN
INTRAMUSCULAR | Status: AC
  Filled 2015-01-23: qty 30

## 2015-01-23 MED ORDER — KCL IN DEXTROSE-NACL 20-5-0.9 MEQ/L-%-% IV SOLN
INTRAVENOUS | Status: DC
  Administered 2015-01-23 – 2015-01-24 (×2): via INTRAVENOUS
  Filled 2015-01-23 (×5): qty 1000

## 2015-01-23 MED ORDER — BISACODYL 10 MG RE SUPP: 10.0000 mg | Freq: Every day | RECTAL | Status: DC | PRN

## 2015-01-23 MED ORDER — LACTATED RINGERS IV SOLN
INTRAVENOUS | Status: DC
  Administered 2015-01-23: 1000 mL via INTRAVENOUS

## 2015-01-23 MED ORDER — DEXAMETHASONE SODIUM PHOSPHATE 10 MG/ML IJ SOLN
INTRAMUSCULAR | Status: AC
  Filled 2015-01-23: qty 1

## 2015-01-23 MED ORDER — FLEET ENEMA 7-19 GM/118ML RE ENEM: 1.0000 | ENEMA | Freq: Once | RECTAL | Status: AC | PRN

## 2015-01-23 MED ORDER — PROPOFOL INFUSION 10 MG/ML OPTIME
INTRAVENOUS | Status: DC | PRN
  Administered 2015-01-23: 140 ug/kg/min via INTRAVENOUS

## 2015-01-23 MED ORDER — METOCLOPRAMIDE HCL 5 MG/ML IJ SOLN: 5.0000 mg | Freq: Three times a day (TID) | INTRAMUSCULAR | Status: DC | PRN

## 2015-01-23 MED ORDER — MIDAZOLAM HCL 2 MG/2ML IJ SOLN
INTRAMUSCULAR | Status: AC
  Filled 2015-01-23: qty 2

## 2015-01-23 MED ORDER — FENTANYL CITRATE 0.05 MG/ML IJ SOLN
INTRAMUSCULAR | Status: DC | PRN
  Administered 2015-01-23: 50 ug via INTRAVENOUS

## 2015-01-23 MED ORDER — LACTATED RINGERS IV SOLN
INTRAVENOUS | Status: DC | PRN
  Administered 2015-01-23 (×3): via INTRAVENOUS

## 2015-01-23 MED ORDER — DEXAMETHASONE SODIUM PHOSPHATE 10 MG/ML IJ SOLN
10.0000 mg | Freq: Once | INTRAMUSCULAR | Status: AC
  Administered 2015-01-24: 10 mg via INTRAVENOUS
  Filled 2015-01-23: qty 1

## 2015-01-23 MED ORDER — CEFAZOLIN SODIUM-DEXTROSE 2-3 GM-% IV SOLR
2.0000 g | INTRAVENOUS | Status: AC
  Administered 2015-01-23: 2 g via INTRAVENOUS

## 2015-01-23 MED ORDER — METHOCARBAMOL 1000 MG/10ML IJ SOLN
500.0000 mg | Freq: Four times a day (QID) | INTRAVENOUS | Status: DC | PRN
  Administered 2015-01-23: 500 mg via INTRAVENOUS
  Filled 2015-01-23 (×2): qty 5

## 2015-01-23 MED ORDER — MORPHINE SULFATE 2 MG/ML IJ SOLN: 1.0000 mg | INTRAMUSCULAR | Status: DC | PRN

## 2015-01-23 MED ORDER — SODIUM CHLORIDE 0.9 % IJ SOLN
INTRAMUSCULAR | Status: DC | PRN
  Administered 2015-01-23: 30 mL via INTRAVENOUS

## 2015-01-23 MED ORDER — BUPIVACAINE HCL 0.25 % IJ SOLN
INTRAMUSCULAR | Status: DC | PRN
Start: 2015-01-23 — End: 2015-01-23
  Administered 2015-01-23: 30 mL

## 2015-01-23 SURGICAL SUPPLY — 72 items
BAG SPEC THK2 15X12 ZIP CLS (MISCELLANEOUS) ×3
BAG ZIPLOCK 12X15 (MISCELLANEOUS) ×9 IMPLANT
BIT DRILL 2.8X128 (BIT) ×2 IMPLANT
BIT DRILL 2.8X128MM (BIT) ×1
BLADE EXTENDED COATED 6.5IN (ELECTRODE) ×3 IMPLANT
BLADE SAW SAG 73X25 THK (BLADE) ×2
BLADE SAW SGTL 73X25 THK (BLADE) ×1 IMPLANT
BRUSH FEMORAL CANAL (MISCELLANEOUS) IMPLANT
CLOSURE WOUND 1/2 X4 (GAUZE/BANDAGES/DRESSINGS) ×2
CUP ACETAB PIN MULTI 54MM (Orthopedic Implant) ×2 IMPLANT
DRAPE INCISE IOBAN 66X45 STRL (DRAPES) ×3 IMPLANT
DRAPE ORTHO SPLIT 77X108 STRL (DRAPES) ×6
DRAPE POUCH INSTRU U-SHP 10X18 (DRAPES) ×3 IMPLANT
DRAPE SURG ORHT 6 SPLT 77X108 (DRAPES) ×2 IMPLANT
DRAPE U-SHAPE 47X51 STRL (DRAPES) ×3 IMPLANT
DRSG ADAPTIC 3X8 NADH LF (GAUZE/BANDAGES/DRESSINGS) ×2 IMPLANT
DRSG EMULSION OIL 3X16 NADH (GAUZE/BANDAGES/DRESSINGS) ×3 IMPLANT
DRSG MEPILEX BORDER 4X4 (GAUZE/BANDAGES/DRESSINGS) ×4 IMPLANT
DRSG MEPILEX BORDER 4X8 (GAUZE/BANDAGES/DRESSINGS) ×3 IMPLANT
DURAPREP 26ML APPLICATOR (WOUND CARE) ×3 IMPLANT
ELECT REM PT RETURN 9FT ADLT (ELECTROSURGICAL) ×3
ELECTRODE REM PT RTRN 9FT ADLT (ELECTROSURGICAL) ×1 IMPLANT
EVACUATOR 1/8 PVC DRAIN (DRAIN) ×3 IMPLANT
FACESHIELD WRAPAROUND (MASK) ×12 IMPLANT
FACESHIELD WRAPAROUND OR TEAM (MASK) ×4 IMPLANT
GAUZE SPONGE 4X4 12PLY STRL (GAUZE/BANDAGES/DRESSINGS) ×3 IMPLANT
GLOVE BIO SURGEON STRL SZ7.5 (GLOVE) ×3 IMPLANT
GLOVE BIO SURGEON STRL SZ8 (GLOVE) ×3 IMPLANT
GLOVE BIOGEL PI IND STRL 8 (GLOVE) ×3 IMPLANT
GLOVE BIOGEL PI INDICATOR 8 (GLOVE) ×6
GLOVE SURG SS PI 6.5 STRL IVOR (GLOVE) ×6 IMPLANT
GOWN STRL REUS W/TWL LRG LVL3 (GOWN DISPOSABLE) ×6 IMPLANT
GOWN STRL REUS W/TWL XL LVL3 (GOWN DISPOSABLE) ×3 IMPLANT
HANDPIECE INTERPULSE COAX TIP (DISPOSABLE)
HEAD COCR CTAPER LFIT 36MM+10 (Orthopedic Implant) ×2 IMPLANT
IMMOBILIZER KNEE 20 (SOFTGOODS) ×3
IMMOBILIZER KNEE 20 THIGH 36 (SOFTGOODS) IMPLANT
KIT BASIN OR (CUSTOM PROCEDURE TRAY) ×3 IMPLANT
LINER MARATHON NEUT +4X54X36 (Hips) ×2 IMPLANT
MANIFOLD NEPTUNE II (INSTRUMENTS) ×3 IMPLANT
NDL SAFETY ECLIPSE 18X1.5 (NEEDLE) ×1 IMPLANT
NEEDLE HYPO 18GX1.5 SHARP (NEEDLE) ×3
NS IRRIG 1000ML POUR BTL (IV SOLUTION) ×3 IMPLANT
PACK TOTAL JOINT (CUSTOM PROCEDURE TRAY) ×3 IMPLANT
PADDING CAST COTTON 6X4 STRL (CAST SUPPLIES) ×3 IMPLANT
PASSER SUT SWANSON 36MM LOOP (INSTRUMENTS) ×3 IMPLANT
PEN SKIN MARKING BROAD (MISCELLANEOUS) ×3 IMPLANT
POSITIONER SURGICAL ARM (MISCELLANEOUS) ×3 IMPLANT
PRESSURIZER FEMORAL UNIV (MISCELLANEOUS) IMPLANT
SCREW 6.5MMX30MM (Screw) ×2 IMPLANT
SCREW PINN CAN BONE 6.5MMX15MM (Screw) ×4 IMPLANT
SCREW PINN CAN BOWN 6.5X8 HIP (Screw) ×2 IMPLANT
SET HNDPC FAN SPRY TIP SCT (DISPOSABLE) IMPLANT
SPONGE LAP 18X18 X RAY DECT (DISPOSABLE) ×3 IMPLANT
STAPLER VISISTAT 35W (STAPLE) ×3 IMPLANT
STRIP CLOSURE SKIN 1/2X4 (GAUZE/BANDAGES/DRESSINGS) ×2 IMPLANT
SUCTION FRAZIER TIP 10 FR DISP (SUCTIONS) ×3 IMPLANT
SUT ETHIBOND NAB CT1 #1 30IN (SUTURE) ×6 IMPLANT
SUT VIC AB 1 CT1 27 (SUTURE) ×9
SUT VIC AB 1 CT1 27XBRD ANTBC (SUTURE) ×3 IMPLANT
SUT VIC AB 2-0 CT1 27 (SUTURE) ×9
SUT VIC AB 2-0 CT1 TAPERPNT 27 (SUTURE) ×3 IMPLANT
SUT VLOC 180 0 24IN GS25 (SUTURE) ×6 IMPLANT
SWAB COLLECTION DEVICE MRSA (MISCELLANEOUS) ×3 IMPLANT
SYR 50ML LL SCALE MARK (SYRINGE) ×3 IMPLANT
TOWEL OR 17X26 10 PK STRL BLUE (TOWEL DISPOSABLE) ×6 IMPLANT
TOWER CARTRIDGE SMART MIX (DISPOSABLE) IMPLANT
TRAY FOLEY CATH 14FRSI W/METER (CATHETERS) ×3 IMPLANT
TUBE ANAEROBIC SPECIMEN COL (MISCELLANEOUS) IMPLANT
TUBE KAMVAC SUCTION (TUBING) IMPLANT
WATER STERILE IRR 1500ML POUR (IV SOLUTION) ×3 IMPLANT
YANKAUER SUCT BULB TIP 10FT TU (MISCELLANEOUS) ×3 IMPLANT

## 2015-01-23 NOTE — Anesthesia Postprocedure Evaluation (Signed)
  Anesthesia Post-op Note  Patient: Christine [name]  Procedure(s) Performed: Procedure(s) (LRB): LEFT HIP ACETABULAR  REVISION (Left)  Patient Location: PACU  Anesthesia Type: Spinal  Level of Consciousness: awake and alert   Airway and Oxygen Therapy: Patient Spontanous Breathing  Post-op Pain: mild  Post-op Assessment: Post-op Vital signs reviewed, Patient's Cardiovascular Status Stable, Respiratory Function Stable, Patent Airway and No signs of Nausea or vomiting  Last Vitals:  Filed Vitals:   01/23/15 1503  BP: 135/72  Pulse: 68  Temp: 36.3 C  Resp: 15    Post-op Vital Signs: stable   Complications: No apparent anesthesia complications

## 2015-01-23 NOTE — Progress Notes (Signed)
Utilization review completed.  

## 2015-01-23 NOTE — H&P (View-Only) (Signed)
Christine [name] DOB: Nov 19, 1951 Married / Language: English / Race: White Female Date of Admission: 01/23/2015 CC: Loose left total hip arhtroplasty History of Present Illness The patient is a 64 year old female who comes in for a preoperative History and Physical. The patient is scheduled for a left total hip arthroplasty (revision) to be performed by Dr. Dione Plover. Aluisio, MD at [facility] on 01-23-2015. The patient is a 64 year old female who presents with a hip problem. The patient reports left lateral hip problems including pain symptoms that have been present for 1 year(s). The symptoms began in association with an established activity (pain associated with steps). Symptoms reported include hip pain, stiffness, difficulty flexing hip and difficulty ambulating The patient reports symptoms radiating to the: left groin.The patient feels as if their symptoms are does feel they are worsening. Current treatment includes nonsteroidal anti-inflammatory drugs. Pertinent medical history includes total hip replacement (July 27, 2012 by Dr. Percell Miller). Prior to being seen, the patient was previously evaluated by a colleague. Previous workup for this problem has included hip x-rays. Previous treatment for this problem has included corticosteroid injection (troch bursa injection by Dr. Percell Miller 02/13/14, did not provide any relief). She says she did well initially with the hip replacement in 2013. She did not have any problems postoperatively with significant pain, swelling, warmth, redness, wound drainage, or infection. She said she rehabbed it well. Over the past year and a half to two years, however, she has started to develop pain in the left groin and left anterior thigh. It especially occurs with active flexion of the hip and getting up from a sitting position. In addition now, she is developing more weightbearing pain in the groin and anterior thigh. She has also had the lateral thigh pain. It is  hard for her to sleep at night on her left side. She generally can sleep well on the right side. She saw Dr. Percell Miller back in March of this year and had a trochanteric bursal injection, but unfortunately it did not help. She is subsequently here today for a second opinion. Symptoms reported include: pain. and report their pain level to be mild to moderate (occasionally severe, depending on activity). Current treatment includes: NSAIDs. She was sent for an MRI. The patient has not gotten any relief of their symptoms with Cortisone injections. She continues with the significant groin pain and significant difficulty flexing her hip. We obtained an MRI to rule out any kind of tendon tear or any kind of iliopsoas bursal fluid collection. She did end up having a lot of irregularity around the acetabulum. 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 Really looks like it on a plain film where the MRI does show some evidence of that too. The bone scan was negative, but she does have a screw in there, so she might not be having any motion in it to cause a positive bone scan. I recommend that we contemplate doing it an acetabular revision. We did discuss this in detail, procedural risks, potential complications and rehab course. She would like to go ahead and proceed with a revision of her acetabulum. They have been treated conservatively in the past for the above stated problem and despite conservative measures, they continue to have progressive pain and severe functional limitations and dysfunction. They have failed non-operative management including home exercise, medications, and injections. It is felt that they would benefit from undergoing revison total joint replacement. Risks and benefits of the  procedure have been discussed with the patient and they elect to proceed with surgery. There are no active contraindications to surgery such as ongoing infection or rapidly progressive neurological  disease.  Problem List/Past Medical  Arthralgia of left hip (M25.552) Status post total replacement of left hip (Z36.644) Osteoarthritis Hypercholesterolemia Gastroesophageal Reflux Disease Chronic Pain Chronic Cystitis Anemia Depression Varicose veins Hemorrhoids Urinary Incontinence Urinary Tract Infection Past History Degenerative Disc Disease Measles Mumps Menopause Breast disease Fibrocystic Breast Disease  Allergies Sulfa Drugs Rash, Itching. Codeine Derivatives Nausea.  Family History  Depression mother Diabetes Mellitus sister Bleeding disorder sister Congestive Heart Failure father Cancer mother Rheumatoid Arthritis mother Heart Disease mother, father and sister Heart disease in female family member before age 36 Chronic Obstructive Lung Disease father Osteoarthritis father, sister and brother Osteoporosis father Hypertension father and sister  Social History  Marital status married Number of flights of stairs before winded less than 1 Drug/Alcohol Rehab (Previously) no Exercise Exercises rarely; does gym / weights Alcohol use current drinker; drinks wine; only occasionally per week Drug/Alcohol Rehab (Currently) no Children 2 Tobacco / smoke exposure no Tobacco use former smoker; smoke(d) less than 1/2 pack(s) per day Living situation live with spouse Current work status working full time Illicit drug use no 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 Etodolac (500MG  Tablet, Oral) Active. DULoxetine HCl (60MG  Capsule DR Part, Oral) Active. Cyclobenzaprine HCl (5MG  Tablet, Oral) Active. Vitamin D3 (Oral) Specific dose unknown - Active. Omeprazole (40MG  Capsule DR, Oral) Active. Magnesium (500MG  Tablet, Oral) Active.  Past Surgical History Mammoplasty; Reduction  bilateral Hysterectomy partial (non-cancerous) Gallbladder Surgery open Straighten Nasal Septum Spinal Surgery Spinal Fusion lower back Foot Surgery bilateral Breast Mass; Local Excision bilateral Breast Biopsy bilateral, multiple times Appendectomy Dilation and Curettage of Uterus Colon Polyp Removal - Colonoscopy Cesarean Delivery 1 time Total Knee Replacement right Total Hip Replacement left Tonsillectomy Tubal Ligation  Review of Systems  General Not Present- Chills, Fatigue, Fever, Memory Loss, Night Sweats, Weight Gain and Weight Loss. Skin Not Present- Eczema, Hives, Itching, Lesions and Rash. HEENT Not Present- Dentures, Double Vision, Headache, Hearing Loss, Tinnitus and Visual Loss. Respiratory Not Present- Allergies, Chronic Cough, Coughing up blood, Shortness of breath at rest and Shortness of breath with exertion. Cardiovascular Not Present- Chest Pain, Difficulty Breathing Lying Down, Murmur, Palpitations, Racing/skipping heartbeats and Swelling. Gastrointestinal Not Present- Abdominal Pain, Bloody Stool, Constipation, Diarrhea, Difficulty Swallowing, Heartburn, Jaundice, Loss of appetitie, Nausea and Vomiting. Female Genitourinary Present- Stress Incontinence and Urinating at Night. Not Present- Blood in Urine, Discharge, Flank Pain, Incontinence, Painful Urination, Urgency, Urinary frequency, Urinary Retention and Weak urinary stream. Musculoskeletal Present- Back Pain, Joint Pain, Joint Swelling, Morning Stiffness, Muscle Pain and Muscle Weakness. Not Present- Spasms. Neurological Not Present- Blackout spells, Difficulty with balance, Dizziness, Paralysis, Tremor and Weakness. Psychiatric Not Present- Insomnia.   Vitals  Weight: 177 lb Height: 63in Weight was reported by patient. Height was reported by patient. Body Surface Area: 1.84 m Body Mass Index: 31.35 kg/m  BP: 142/90 (Sitting, Left Arm, Standard)   Physical  Exam General Mental Status -Alert, cooperative and good historian. General Appearance-pleasant, Not in acute distress. Orientation-Oriented X3. Build & Nutrition-Well nourished and Well developed.  Head and Neck Head-normocephalic, atraumatic . Neck Global Assessment - supple, no bruit auscultated on the right, no bruit auscultated on the left.  Eye Pupil - Bilateral-Regular and Round. Motion - Bilateral-EOMI.  Chest and Lung Exam  Auscultation Breath sounds - clear at anterior chest wall and clear at posterior chest wall. Adventitious sounds - No Adventitious sounds.  Cardiovascular Auscultation Rhythm - Regular rate and rhythm. Heart Sounds - S1 WNL and S2 WNL. Murmurs & Other Heart Sounds - Auscultation of the heart reveals - No Murmurs.  Abdomen Palpation/Percussion Tenderness - Abdomen is non-tender to palpation. Rigidity (guarding) - Abdomen is soft. Auscultation Auscultation of the abdomen reveals - Bowel sounds normal.  Female Genitourinary Note: Not done, not pertinent to present illness   Musculoskeletal Note: She is in no distress. She will not actively flex the hip, but passively could flex her to 110. She is not having any pain on rotation. She is tender in the greater trochanter.  MRI scan and there appears to be a significant lucency between the acetabular shell and the acetabulum throughout the entire shell except around the screw. This corresponds to the findings on her plain film also.   Assessment & Plan  Status post total replacement of left hip (G62.694)  Note:Surgical Plans: Left Acetabular Revision versus Left Total Hip Revision  Disposition: Home  PCP: Dr. Earle Gell - Patient has been seen and felt to be stable for upcoming surgery.  IV TXA  Anesthesia Issues: One time she was not fully asleep and also had postop nausea.  Signed electronically by Joelene Millin, III PA-C

## 2015-01-23 NOTE — Transfer of Care (Signed)
Immediate Anesthesia Transfer of Care Note  Patient: Christine [name]  Procedure(s) Performed: Procedure(s): LEFT HIP ACETABULAR  REVISION (Left)  Patient Location: PACU  Anesthesia Type:Regional and Spinal  Level of Consciousness: awake, sedated and patient cooperative  Airway & Oxygen Therapy: Patient Spontanous Breathing and Patient connected to face mask oxygen  Post-op Assessment: Report given to RN and Post -op Vital signs reviewed and stable  Post vital signs: Reviewed and stable  Last Vitals:  Filed Vitals:   01/23/15 0723  BP: 135/93  Pulse: 79  Temp: 36.6 C  Resp: 20    Complications: No apparent anesthesia complications

## 2015-01-23 NOTE — Anesthesia Preprocedure Evaluation (Addendum)
Anesthesia Evaluation  Patient identified by MRN, date of birth, ID band Patient awake    Reviewed: Allergy & Precautions, NPO status , Patient's Chart, lab work & pertinent test results  History of Anesthesia Complications (+) PONV and POST - OP SPINAL HEADACHE  Airway Mallampati: II  TM Distance: >3 FB Neck ROM: Full    Dental no notable dental hx.    Pulmonary neg pulmonary ROS, former smoker,  breath sounds clear to auscultation  Pulmonary exam normal       Cardiovascular negative cardio ROS  Rhythm:Regular Rate:Normal     Neuro/Psych negative neurological ROS  negative psych ROS   GI/Hepatic negative GI ROS, Neg liver ROS,   Endo/Other  negative endocrine ROS  Renal/GU negative Renal ROS  negative genitourinary   Musculoskeletal negative musculoskeletal ROS (+)   Abdominal   Peds negative pediatric ROS (+)  Hematology negative hematology ROS (+)   Anesthesia Other Findings   Reproductive/Obstetrics negative OB ROS                            Anesthesia Physical Anesthesia Plan  ASA: II  Anesthesia Plan: Spinal   Post-op Pain Management:    Induction:   Airway Management Planned:   Additional Equipment:   Intra-op Plan:   Post-operative Plan:   Informed Consent: I have reviewed the patients History and Physical, chart, labs and discussed the procedure including the risks, benefits and alternatives for the proposed anesthesia with the patient or authorized representative who has indicated his/her understanding and acceptance.   Dental advisory given  Plan Discussed with: CRNA  Anesthesia Plan Comments: (H/o post dural puncture HA. Also awareness of intubation after induction of GA.   After long discussion of SAB vs GA, Prefers SAB.)       Anesthesia Quick Evaluation

## 2015-01-23 NOTE — Anesthesia Procedure Notes (Signed)
Spinal  Patient location during procedure: OR Staffing Anesthesiologist: Daisha Filosa Performed by: anesthesiologist  Preanesthetic Checklist Completed: patient identified, site marked, surgical consent, pre-op evaluation, timeout performed, IV checked, risks and benefits discussed and monitors and equipment checked Spinal Block Patient position: sitting Prep: Betadine Patient monitoring: heart rate, continuous pulse ox and blood pressure Injection technique: single-shot Needle Needle type: Sprotte  Needle gauge: 24 G Needle length: 9 cm Additional Notes Expiration date of kit checked and confirmed. Patient tolerated procedure well, without complications.     

## 2015-01-23 NOTE — Evaluation (Signed)
Physical Therapy Evaluation Patient Details Name: Christine [name] MRN: [id number] DOB: 04-Mar-1951 Today's Date: 01/23/2015   History of Present Illness  L THR revision  Clinical Impression  Pt s/p L THR revision presents with decreased L LE strength/ROM and post op pain limiting functional mobility.  Pt should progress to d.c home with family assist and HHPT follow up.    Follow Up Recommendations Home health PT    Equipment Recommendations  None recommended by PT    Recommendations for Other Services OT consult     Precautions / Restrictions Precautions Precautions: Posterior Hip;Fall Precaution Comments: Precautions unclear based on orders and information from pt.  Clarification requested.  Will assume post THR at this time Restrictions Weight Bearing Restrictions: No      Mobility  Bed Mobility Overal bed mobility: Needs Assistance Bed Mobility: Supine to Sit;Sit to Supine     Supine to sit: Min assist;Mod assist;+2 for physical assistance;+2 for safety/equipment Sit to supine: Mod assist;+2 for physical assistance;+2 for safety/equipment   General bed mobility comments: cues for sequence and use of R LE to self assist  Transfers Overall transfer level: Needs assistance Equipment used: Rolling walker (2 wheeled) Transfers: Sit to/from Stand Sit to Stand: Min assist         General transfer comment: cues for LE management and use of UEs to self assist  Ambulation/Gait Ambulation/Gait assistance: Min assist Ambulation Distance (Feet): 37 Feet Assistive device: Rolling walker (2 wheeled) Gait Pattern/deviations: Step-to pattern;Decreased step length - right;Decreased step length - left;Shuffle;Trunk flexed Gait velocity: decr   General Gait Details: cues for sequence, posture and position from [employer]            Wheelchair Mobility    Modified Rankin (Stroke Patients Only)       Balance                                              Pertinent Vitals/Pain Pain Assessment: 0-10 Pain Score: 5  Pain Location: L hip Pain Descriptors / Indicators: Aching;Sore Pain Intervention(s): Limited activity within patient's tolerance;Monitored during session;Premedicated before session;Ice applied    Home Living Family/patient expects to be discharged to:: Private residence Living Arrangements: Spouse/significant other Available Help at Discharge: Family Type of Home: House Home Access: Stairs to enter Entrance Stairs-Rails: Right Entrance Stairs-Number of Steps: 3 Home Layout: Two level;Able to live on main level with bedroom/bathroom Home Equipment: Gilford Rile - 2 wheels;Cane - single point      Prior Function Level of Independence: Independent               Hand Dominance   Dominant Hand: Right    Extremity/Trunk Assessment   Upper Extremity Assessment: Overall WFL for tasks assessed           Lower Extremity Assessment: LLE deficits/detail   LLE Deficits / Details: 2+/5 hip strength with AAROM at hip to 60 flex and 15 abd  Cervical / Trunk Assessment: Normal  Communication   Communication: No difficulties  Cognition Arousal/Alertness: Awake/alert Behavior During Therapy: WFL for tasks assessed/performed Overall Cognitive Status: Within Functional Limits for tasks assessed                      General Comments      Exercises Total Joint Exercises Ankle Circles/Pumps: AROM;Both;15 reps;Supine Quad Sets: AROM;Both;10 reps;Supine Heel  Slides: AAROM;15 reps;Supine;Left Hip ABduction/ADduction: AAROM;Left;10 reps;Supine      Assessment/Plan    PT Assessment Patient needs continued PT services  PT Diagnosis Difficulty walking   PT Problem List Decreased strength;Decreased range of motion;Decreased activity tolerance;Decreased mobility;Decreased knowledge of use of DME;Pain;Decreased knowledge of precautions  PT Treatment Interventions DME instruction;Gait training;Stair  training;Functional mobility training;Therapeutic activities;Therapeutic exercise;Patient/family education   PT Goals (Current goals can be found in the Care Plan section) Acute Rehab PT Goals Patient Stated Goal: Be able to climb stairs without difficulty PT Goal Formulation: With patient Time For Goal Achievement: 01/30/15 Potential to Achieve Goals: Good    Frequency 7X/week   Barriers to discharge        Co-evaluation               End of Session Equipment Utilized During Treatment: Gait belt Activity Tolerance: Patient limited by fatigue;Other (comment) (dizziness with ambulation) Patient left: in bed;with call bell/phone within reach Nurse Communication: Mobility status         Time: 3524-8185 PT Time Calculation (min) (ACUTE ONLY): 42 min   Charges:   PT Evaluation $Initial PT Evaluation Tier I: 1 Procedure PT Treatments $Gait Training: 8-22 mins $Therapeutic Exercise: 8-22 mins   PT G Codes:        Lloyde Ludlam February 20, 2015, 5:26 PM

## 2015-01-23 NOTE — Brief Op Note (Signed)
01/23/2015  11:06 AM  PATIENT:  Nadine Counts  64 y.o. female  PRE-OPERATIVE DIAGNOSIS:  loose left total hip arthroplasty   POST-OPERATIVE DIAGNOSIS:  loose left total hip arthroplasty  PROCEDURE:  Procedure(s): LEFT HIP ACETABULAR  REVISION (Left)  SURGEON:  Surgeon(s) and Role:    * Gearlean Alf, MD - Primary  PHYSICIAN ASSISTANT:   ASSISTANTS: Arlee Muslim, PA-C   ANESTHESIA:   spinal  EBL:  Total I/O In: 1000 [I.V.:1000] Out: 450 [Urine:200; Blood:250]  BLOOD ADMINISTERED:none  DRAINS: (Medium) Hemovact drain(s) in the left hip with  Suction Open   LOCAL MEDICATIONS USED:  OTHER [employer]  COUNTS:  YES  TOURNIQUET:  * No tourniquets in log *  DICTATION: .Other Dictation: Dictation Number [id number]  PLAN OF CARE: Admit to inpatient   PATIENT DISPOSITION:  PACU - hemodynamically stable.

## 2015-01-23 NOTE — Interval H&P Note (Signed)
History and Physical Interval Note:  01/23/2015 9:30 AM  Christine [name]  has presented today for surgery, with the diagnosis of loose left total hip arthroplasty   The various methods of treatment have been discussed with the patient and family. After consideration of risks, benefits and other options for treatment, the patient has consented to  Procedure(s): [employer] (Left) as a surgical intervention .  The patient's history has been reviewed, patient examined, no change in status, stable for surgery.  I have reviewed the patient's chart and labs.  Questions were answered to the patient's satisfaction.     Gearlean Alf

## 2015-01-24 ENCOUNTER — Encounter (HOSPITAL_COMMUNITY): Payer: Self-pay | Admitting: Orthopedic Surgery

## 2015-01-24 LAB — CBC
HCT: 30.4 % — ABNORMAL LOW (ref 36.0–46.0)
Hemoglobin: 9.7 g/dL — ABNORMAL LOW (ref 12.0–15.0)
MCH: 29.8 pg (ref 26.0–34.0)
MCHC: 31.9 g/dL (ref 30.0–36.0)
MCV: 93.5 fL (ref 78.0–100.0)
Platelets: 273 10*3/uL (ref 150–400)
RBC: 3.25 MIL/uL — ABNORMAL LOW (ref 3.87–5.11)
RDW: 13.2 % (ref 11.5–15.5)
WBC: 10.5 10*3/uL (ref 4.0–10.5)

## 2015-01-24 LAB — BASIC METABOLIC PANEL
ANION GAP: 6 (ref 5–15)
BUN: 10 mg/dL (ref 6–23)
CO2: 26 mmol/L (ref 19–32)
CREATININE: 0.62 mg/dL (ref 0.50–1.10)
Calcium: 8.1 mg/dL — ABNORMAL LOW (ref 8.4–10.5)
Chloride: 105 mmol/L (ref 96–112)
GFR calc Af Amer: >90 mL/min (ref >90)
GFR calc non Af Amer: >90 mL/min (ref >90)
Glucose, Bld: 117 mg/dL — ABNORMAL HIGH (ref 70–99)
Potassium: 4 mmol/L (ref 3.5–5.1)
Sodium: 137 mmol/L (ref 135–145)

## 2015-01-24 MED ORDER — METHOCARBAMOL 500 MG PO TABS: 500.0000 mg | ORAL_TABLET | Freq: Four times a day (QID) | ORAL | Status: DC | PRN

## 2015-01-24 MED ORDER — OXYCODONE HCL 5 MG PO TABS: 5.0000 mg | ORAL_TABLET | ORAL | Status: DC | PRN

## 2015-01-24 MED ORDER — RIVAROXABAN 10 MG PO TABS: 10.0000 mg | ORAL_TABLET | Freq: Every day | ORAL | Status: DC

## 2015-01-24 NOTE — Care Management Note (Signed)
Page 1 of 1   01/24/2015     2:08:27 PM CARE MANAGEMENT NOTE 01/24/2015  Patient:  Christine [name]   Account Number:  [id number]  Date Initiated:  01/24/2015  Documentation initiated by:  JEFFRIES,SARAH  Subjective/Objective Assessment:   adm: TOTAL KNEE BILATERAL REPLACEMENT  (Bilateral)     Action/Plan:   discharge planning   Anticipated DC Date:  01/25/2015   Anticipated DC Plan:  HOME W HOME HEALTH SERVICES      DC Planning Services  CM consult      PAC Choice  HOME HEALTH   Choice offered to / List presented to:  [name]        HH arranged  HH-2 PT      HH agency  [facility]   Status of service:  Completed, signed off Medicare Important Message given?   (If response is "NO", the following Medicare IM given date fields will be blank) Date Medicare IM given:   Medicare IM given by:   Date Additional Medicare IM given:   Additional Medicare IM given by:    Discharge Disposition:  HOME W HOME HEALTH SERVICES  Per UR Regulation:    If discussed at Long Length of Stay Meetings, dates discussed:    Comments:  01/24/15 14:00 Cm met with pt in room to offer choice of home health agency. Pt chooses [facility] to render HHPT. Address and contact information verified by pt.  Pt has rolling walker and commode at home. Referral emailed to [facility] rep, Tim. No other CM needs were communicated. sarah Jeffries, BSN, CM [phone].   

## 2015-01-24 NOTE — Progress Notes (Signed)
Physical Therapy Treatment Patient Details Name: POOJA CAMUSO MRN: [id number] DOB: May 12, 1951 Today's Date: 01/24/2015    History of Present Illness L THR revision    PT Comments    POD # 1 pm session.  Pt sitting EOB with spouse who just assisted pt from BR and did well.  Shaped leg lifter he bought and instructed on use.  Assisted with amb in hallway with spouse guarding pt.  Practiced going up/down 2 steps with one L rail.  Returned to room and performed THR TE's following HEP handout.  Applied ICE.  Noted more bright red blood on dressing under tight tape.  Notified RN.   Follow Up Recommendations  Home health PT     Equipment Recommendations  None recommended by PT    Recommendations for Other Services       Precautions / Restrictions Precautions Precautions: Posterior Hip;Fall Precaution Comments: pt recalled 2/3 THP so re educated Restrictions Weight Bearing Restrictions: No    Mobility  Bed Mobility Overal bed mobility: Needs Assistance Bed Mobility: Sit to Supine       Sit to supine: Supervision;Min guard   General bed mobility comments: pt sitting EOB on arrival  Transfers Overall transfer level: Needs assistance Equipment used: Rolling walker (2 wheeled) Transfers: Sit to/from Stand Sit to Stand: Supervision;Min guard         General transfer comment: one VC on safety with turns  Ambulation/Gait Ambulation/Gait assistance: Supervision;Min guard Ambulation Distance (Feet): 85 Feet Assistive device: Rolling walker (2 wheeled) Gait Pattern/deviations: Step-to pattern Gait velocity: decreased   General Gait Details: <25% cues for sequence, posture and position from RW plus increased time.  had pt amb with spouse with instruction on safe handling   Stairs Stairs: Yes Stairs assistance: Min guard Stair Management: One rail Left;Step to pattern;Forwards Number of Stairs: 2 General stair comments: one initial VC on proper sequencing and safety.    Performed with spouse.  Wheelchair Mobility    Modified Rankin (Stroke Patients Only)       Balance                                    Cognition                            Exercises   Total Hip Replacement TE's 10 reps ankle pumps 10 reps knee presses 10 reps heel slides 10 reps SAQ's 10 reps ABD Followed by ICE     General Comments        Pertinent Vitals/Pain Pain Assessment: 0-10 Pain Score: 5  Pain Location: L hip Pain Descriptors / Indicators: Burning Pain Intervention(s): Monitored during session;Premedicated before session    Home Living                      Prior Function            PT Goals (current goals can now be found in the care plan section) Progress towards PT goals: Progressing toward goals    Frequency  7X/week    PT Plan      Co-evaluation             End of Session Equipment Utilized During Treatment: Gait belt Activity Tolerance: Patient tolerated treatment well Patient left: in chair;with call bell/phone within reach;with family/visitor present     Time: 1345-1410 PT  Time Calculation (min) (ACUTE ONLY): 25 min  Charges:  $Gait Training: 8-22 mins $Therapeutic Exercise: 8-22 mins $Therapeutic Activity: 8-22 mins                    G Codes:      Rica Koyanagi  PTA [facility]  Acute  Rehab Pager      [phone]

## 2015-01-24 NOTE — Evaluation (Signed)
Occupational Therapy Evaluation Patient Details Name: Christine [name] MRN: [id number] DOB: 04/16/1951 Today's Date: 01/24/2015    History of Present Illness L THR revision   Clinical Impression   This 64 year old female was admitted for the above surgery.  Reviewed precautions, AE and ADLs. Will follow in acute; pt will not need continued OT after this. Goals are supervision to min guard for bathroom transfers.    Follow Up Recommendations  No OT follow up    Equipment Recommendations  None recommended by OT    Recommendations for Other Services       Precautions / Restrictions Precautions Precautions: Posterior Hip;Fall Restrictions Weight Bearing Restrictions: No      Mobility Bed Mobility   Bed Mobility: Supine to Sit;Sit to Supine     Supine to sit: Supervision Sit to supine: Supervision   General bed mobility comments: used sheet then leg lifter    Transfers                      Balance                                            ADL Overall ADL's : Needs assistance/impaired             Lower Body Bathing: Minimal assistance;Sit to/from stand;With adaptive equipment       Lower Body Dressing: Minimal assistance;With adaptive equipment;Sit to/from stand                 General ADL Comments: Pt is able to perform UB adls with set up.  She self cues for THPs.  She has a Secondary school teacher and sock aide at home:  Used these at EOB and pt return demonstrated.  Also tried sheet and leg lifter for bed mobility:  pt plans to get this.  She recently returned from bathroom and verbalizes comfort performing this with THPs:  she will have 3:1 over high commode.  Demonstrated shower transfer:  pt does not feel she needs to practice this.  Will check in tomorrow for needs.     Vision     Perception     Praxis      Pertinent Vitals/Pain Pain Score: 5  Pain Location: L hip Pain Descriptors / Indicators: Aching Pain Intervention(s):  Limited activity within patient's tolerance;Monitored during session;Premedicated before session     Hand Dominance Right   Extremity/Trunk Assessment Upper Extremity Assessment Upper Extremity Assessment: Overall WFL for tasks assessed           Communication Communication Communication: No difficulties   Cognition Arousal/Alertness: Awake/alert Behavior During Therapy: WFL for tasks assessed/performed Overall Cognitive Status: Within Functional Limits for tasks assessed                     General Comments       Exercises       Shoulder Instructions      Home Living Family/patient expects to be discharged to:: Private residence Living Arrangements: Spouse/significant other Available Help at Discharge: Family               Bathroom Shower/Tub: Walk-in shower   Bathroom Toilet: Handicapped height     Home Equipment: Environmental consultant - 2 wheels;Cane - single point          Prior Functioning/Environment Level of Independence: Independent  OT Diagnosis: Generalized weakness   OT Problem List: Decreased strength;Pain;Decreased knowledge of use of DME or AE   OT Treatment/Interventions: Self-care/ADL training;DME and/or AE instruction;Patient/family education    OT Goals(Current goals can be found in the care plan section) Acute Rehab OT Goals Patient Stated Goal: Get back into car comfortably OT Goal Formulation: With patient Time For Goal Achievement: 01/31/15 Potential to Achieve Goals: Good ADL Goals Pt Will Transfer to Toilet: with supervision;bedside commode;ambulating Pt Will Perform Tub/Shower Transfer: Shower transfer;with min guard assist;ambulating;shower seat  OT Frequency: Min 2X/week   Barriers to D/C:            Co-evaluation              End of Session    Activity Tolerance: Patient tolerated treatment well Patient left: in bed;with call bell/phone within reach   Time: 2458-0998 OT Time Calculation (min):  24 min Charges:  OT General Charges $OT Visit: 1 Procedure OT Evaluation $Initial OT Evaluation Tier I: 1 Procedure G-Codes:    Avalina Benko 10-Feb-2015, 10:58 AM Lesle Chris, OTR/L [phone] February 10, 2015

## 2015-01-24 NOTE — Progress Notes (Signed)
   Subjective: 1 Day Post-Op Procedure(s) (LRB): LEFT HIP ACETABULAR  REVISION (Left) Patient reports pain as mild.   Patient seen in rounds by Dr. Wynelle Link. Patient is well, and has had no acute complaints or problems Patient had a good night on the evening of surgery.  She walked over 30 feet that day. Doing fairly well today.  If she does very well today and wants to go home, then allow home later this evening.  If not, then probably tomorrow.  Objective: Vital signs in last 24 hours: Temp:  [97.3 F (36.3 C)-98.5 F (36.9 C)] 97.7 F (36.5 C) (02/25 0603) Pulse Rate:  [53-77] 77 (02/25 0603) Resp:  [11-18] 18 (02/25 0740) BP: (101-157)/(56-93) 102/78 mmHg (02/25 0603) SpO2:  [93 %-100 %] 95 % (02/25 0603)  Intake/Output from previous day:  Intake/Output Summary (Last 24 hours) at 01/24/15 0820 Last data filed at 01/24/15 0622  Gross per 24 hour  Intake   4615 ml  Output   4060 ml  Net    555 ml    Intake/Output this shift: UOP 1000  Labs:  Recent Labs  01/24/15 0543  HGB 9.7*    Recent Labs  01/24/15 0543  WBC 10.5  RBC 3.25*  HCT 30.4*  PLT 273    Recent Labs  01/24/15 0543  NA 137  K 4.0  CL 105  CO2 26  BUN 10  CREATININE 0.62  GLUCOSE 117*  CALCIUM 8.1*   No results for input(s): LABPT, INR in the last 72 hours.  EXAM: General - Patient is Alert, Appropriate and Oriented Extremity - Neurovascular intact Sensation intact distally Dorsiflexion/Plantar flexion intact Dressing - clean Motor Function - intact, moving foot and toes well on exam.   Assessment/Plan: 1 Day Post-Op Procedure(s) (LRB): LEFT HIP ACETABULAR  REVISION (Left) Procedure(s) (LRB): LEFT HIP ACETABULAR  REVISION (Left) Past Medical History  Diagnosis Date  . Blood transfusion 1970's    bleeding  after a fertility exploratory surgery   . GERD (gastroesophageal reflux disease)   . Depression   . Bronchitis     "once"  . High cholesterol   . Arthritis     "q  joint in my body; it's eating me up" (07/27/2012)  . Chronic lower back pain 07/27/2012    "they said I need another fusion right now"  . Complication of anesthesia 1990's    "didn't have me totally asleep when they put ETT in; very traumatic"  . PONV (postoperative nausea and vomiting)   . Urinary tract infection     hx of   . Anemia 2012    hx of    Principal Problem:   Failed total hip arthroplasty Active Problems:   OA (osteoarthritis) of hip  Estimated body mass index is 30.12 kg/(m^2) as calculated from the following:   Height as of this encounter: 5\' 5"  (1.651 m).   Weight as of this encounter: 82.101 kg (181 lb). Advance diet Up with therapy Discharge home with home health if does very well with both sessions of therapy Diet - Cardiac diet Follow up - in 2 weeks Activity - WBAT Disposition - Home Condition Upon Discharge - Pending D/C Meds - See DC Summary DVT Prophylaxis - Howard, PA-C Orthopaedic Surgery 01/24/2015, 8:20 AM

## 2015-01-24 NOTE — Discharge Instructions (Addendum)
Dr. Gaynelle Arabian Total Joint Specialist [facility] [address] [place], [place] [address] [phone]   TOTAL HIP REPLACEMENT POSTOPERATIVE DIRECTIONS    Hip Rehabilitation, Guidelines Following Surgery  The results of a hip operation are greatly improved after range of motion and muscle strengthening exercises. Follow all safety measures which are given to protect your hip. If any of these exercises cause increased pain or swelling in your joint, decrease the amount until you are comfortable again. Then slowly increase the exercises. Call your caregiver if you have problems or questions.  HOME CARE INSTRUCTIONS  Most of the following instructions are designed to prevent the dislocation of your new hip.  Remove items at home which could result in a fall. This includes throw rugs or furniture in walking pathways.  Continue medications as instructed at time of discharge.  You may have some home medications which will be placed on hold until you complete the course of blood thinner medication.  You may start showering once you are discharged home but do not submerge the incision under water. Just pat the incision dry and apply a dry gauze dressing on daily. Do not put on socks or shoes without following the instructions of your caregivers.  Sit on high chairs so your hips are not bent more than 90 degrees.  Sit on chairs with arms. Use the chair arms to help push yourself up when arising.  Keep your leg on the side of the operation out in front of you when standing up.  Arrange for the use of a toilet seat elevator so you are not sitting low.  Do not do any exercises or get in any positions that cause your toes to point in (pigeon toed).  Always sleep with a pillow between your legs. Do not lie on your side in sleep with both knees touching the bed.   Walk with walker as instructed.  You may resume a sexual relationship in one month or when given the OK by  your caregiver.  Use walker as long as suggested by your caregivers.  You may put full weight on your legs and walk as much as is comfortable. Avoid periods of inactivity such as sitting longer than an hour when not asleep. This helps prevent blood clots.  You may return to work once you are cleared by Engineer, production.  Do not drive a car for 6 weeks or until released by your surgeon.  Do not drive while taking narcotics.  Wear elastic stockings for three weeks following surgery during the day but you may remove then at night.  Make sure you keep all of your appointments after your operation with all of your doctors and caregivers. You should call the office at the above phone number and make an appointment for approximately two weeks after the date of your surgery. Change the dressing daily and reapply a dry dressing each time. Please pick up a stool softener and laxative for home use as long as you are requiring pain medications.  ICE to the affected hip every three hours for 30 minutes at a time and then as needed for pain and swelling. Continue to use ice on the hip for pain and swelling from surgery. You may notice swelling that will progress down to the foot and ankle.  This is normal after  surgery.  Elevate the leg when you are not up walking on it.   It is important for you to complete the blood thinner  medication as prescribed by your doctor.  Continue to use the breathing machine which will help keep your temperature down.  It is common for your temperature to cycle up and down following surgery, especially at night when you are not up moving around and exerting yourself.  The breathing machine keeps your lungs expanded and your temperature down.  RANGE OF MOTION AND STRENGTHENING EXERCISES  These exercises are designed to help you keep full movement of your hip joint. Follow your caregiver's or physical therapist's instructions. Perform all exercises about fifteen times, three times per  day or as directed. Exercise both hips, even if you have had only one joint replacement. These exercises can be done on a training (exercise) mat, on the floor, on a table or on a bed. Use whatever works the best and is most comfortable for you. Use music or television while you are exercising so that the exercises are a pleasant break in your day. This will make your life better with the exercises acting as a break in routine you can look forward to.  Lying on your back, slowly slide your foot toward your buttocks, raising your knee up off the floor. Then slowly slide your foot back down until your leg is straight again.  Lying on your back spread your legs as far apart as you can without causing discomfort.  Lying on your side, raise your upper leg and foot straight up from the floor as far as is comfortable. Slowly lower the leg and repeat.  Lying on your back, tighten up the muscle in the front of your thigh (quadriceps muscles). You can do this by keeping your leg straight and trying to raise your heel off the floor. This helps strengthen the largest muscle supporting your knee.  Lying on your back, tighten up the muscles of your buttocks both with the legs straight and with the knee bent at a comfortable angle while keeping your heel on the floor.   SKILLED REHAB INSTRUCTIONS: If the patient is transferred to a skilled rehab facility following release from the hospital, a list of the current medications will be sent to the facility for the patient to continue.  When discharged from the skilled rehab facility, please have the facility set up the patient's [place] prior to being released. Also, the skilled facility will be responsible for providing the patient with their medications at time of release from the facility to include their pain medication, the muscle relaxants, and their blood thinner medication. If the patient is still at the rehab facility at time of the two week  follow up appointment, the skilled rehab facility will also need to assist the patient in arranging follow up appointment in our office and any transportation needs.  MAKE SURE YOU:  Understand these instructions.  Will watch your condition.  Will get help right away if you are not doing well or get worse.  Pick up stool softner and laxative for home use following surgery while on pain medications. Do not submerge incision under water. Please use good hand washing techniques while changing dressing each day. May shower starting three days after surgery. Please use a clean towel to pat the incision dry following showers. Continue to use ice for pain and swelling after surgery. Do not use any lotions or creams on the incision until instructed by your surgeon. Hip precautions.  Total Hip Protocol.  Take Xarelto for two and a half more weeks, then discontinue Xarelto. Once the  patient has completed the blood thinner regimen, then take a Baby 81 mg Aspirin daily for three more weeks.  Postoperative Constipation Protocol  Constipation - defined medically as fewer than three stools per week and severe constipation as less than one stool per week.  One of the most common issues patients have following surgery is constipation.  Even if you have a regular bowel pattern at home, your normal regimen is likely to be disrupted due to multiple reasons following surgery.  Combination of anesthesia, postoperative narcotics, change in appetite and fluid intake all can affect your bowels.  In order to avoid complications following surgery, here are some recommendations in order to help you during your recovery period.  Colace (docusate) - Pick up an over-the-counter form of Colace or another stool softener and take twice a day as long as you are requiring postoperative pain medications.  Take with a full glass of water daily.  If you experience loose stools or diarrhea, hold the colace until you stool forms  back up.  If your symptoms do not get better within 1 week or if they get worse, check with your doctor.  Dulcolax (bisacodyl) - Pick up over-the-counter and take as directed by the product packaging as needed to assist with the movement of your bowels.  Take with a full glass of water.  Use this product as needed if not relieved by Colace only.   MiraLax (polyethylene glycol) - Pick up over-the-counter to have on hand.  MiraLax is a solution that will increase the amount of water in your bowels to assist with bowel movements.  Take as directed and can mix with a glass of water, juice, soda, coffee, or tea.  Take if you go more than two days without a movement. Do not use MiraLax more than once per day. Call your doctor if you are still constipated or irregular after using this medication for 7 days in a row.  If you continue to have problems with postoperative constipation, please contact the office for further assistance and recommendations.  If you experience "the worst abdominal pain ever" or develop nausea or vomiting, please contact the office immediatly for further recommendations for treatment.    Information on my medicine - XARELTO (Rivaroxaban)  This medication education was reviewed with me or my healthcare representative as part of my discharge preparation.  The pharmacist that spoke with me during my hospital stay was:  Absher, Julieta Bellini, RPH  Why was Xarelto prescribed for you? Xarelto was prescribed for you to reduce the risk of blood clots forming after orthopedic surgery. The medical term for these abnormal blood clots is venous thromboembolism (VTE).  What do you need to know about xarelto ? Take your Xarelto ONCE DAILY at the same time every day. You may take it either with or without food.  If you have difficulty swallowing the tablet whole, you may crush it and mix in applesauce just prior to taking your dose.  Take Xarelto exactly as prescribed by your doctor and  DO NOT stop taking Xarelto without talking to the doctor who prescribed the medication.  Stopping without other VTE prevention medication to take the place of Xarelto may increase your risk of developing a clot.  After discharge, you should have regular check-up appointments with your healthcare provider that is prescribing your Xarelto.    What do you do if you miss a dose? If you miss a dose, take it as soon as you remember on the same  day then continue your regularly scheduled once daily regimen the next day. Do not take two doses of Xarelto on the same day.   Important Safety Information A possible side effect of Xarelto is bleeding. You should call your healthcare provider right away if you experience any of the following: ? Bleeding from an injury or your nose that does not stop. ? Unusual colored urine (red or dark brown) or unusual colored stools (red or black). ? Unusual bruising for unknown reasons. ? A serious fall or if you hit your head (even if there is no bleeding).  Some medicines may interact with Xarelto and might increase your risk of bleeding while on Xarelto. To help avoid this, consult your healthcare provider or pharmacist prior to using any new prescription or non-prescription medications, including herbals, vitamins, non-steroidal anti-inflammatory drugs (NSAIDs) and supplements.  This website has more information on Xarelto: [email or web address]

## 2015-01-24 NOTE — Progress Notes (Signed)
Physical Therapy Treatment Patient Details Name: Christine [name] MRN: [id number] DOB: 13-Nov-1951 Today's Date: 01/24/2015    History of Present Illness L THR revision    PT Comments    POD # 1 am session pt OOB in recliner feeling "better".  Pt recalled 2/3 THP.  Amb a greater distance in in hallway then assisted to BR when noted small amount of blood dripping from pt's incision.  Returned to bed, applied pressured tape, applied ICE packs and reported to RN.  Follow Up Recommendations  Home health PT     Equipment Recommendations       Recommendations for Other Services       Precautions / Restrictions Precautions Precautions: Posterior Hip;Fall Precaution Comments: pt recalled 2/3 THP so re educated Restrictions Weight Bearing Restrictions: No    Mobility  Bed Mobility Overal bed mobility: Needs Assistance Bed Mobility: Sit to Supine     Supine to sit: Supervision Sit to supine: Supervision;Min guard   General bed mobility comments: instructed and demonstared how to use a belt to assist L LE on to bed.  Transfers Overall transfer level: Needs assistance Equipment used: Rolling walker (2 wheeled) Transfers: Sit to/from Stand Sit to Stand: Min guard         General transfer comment: cues for LE management and use of UEs to self assist plus to avoid hip flex > 90 degrees.  also assisted on/off commode.  Ambulation/Gait Ambulation/Gait assistance: Min guard;Min assist Ambulation Distance (Feet): 45 Feet Assistive device: Rolling walker (2 wheeled) Gait Pattern/deviations: Step-to pattern;Trunk flexed Gait velocity: decreased   General Gait Details: ,25% cues for sequence, posture and position from RW plus increased time   Stairs            Wheelchair Mobility    Modified Rankin (Stroke Patients Only)       Balance                                    Cognition Arousal/Alertness: Awake/alert Behavior During Therapy: WFL for  tasks assessed/performed Overall Cognitive Status: Within Functional Limits for tasks assessed                      Exercises      General Comments        Pertinent Vitals/Pain Pain Assessment: 0-10 Pain Score: 5  Pain Location: L hip Pain Descriptors / Indicators: Burning Pain Intervention(s): Monitored during session;Premedicated before session    Home Living Family/patient expects to be discharged to:: Private residence Living Arrangements: Spouse/significant other Available Help at Discharge: Family         Home Equipment: Gilford Rile - 2 wheels;Cane - single point      Prior Function Level of Independence: Independent          PT Goals (current goals can now be found in the care plan section) Acute Rehab PT Goals Patient Stated Goal: Get back into car comfortably Progress towards PT goals: Progressing toward goals    Frequency  7X/week    PT Plan      Co-evaluation             End of Session Equipment Utilized During Treatment: Gait belt Activity Tolerance: Patient tolerated treatment well Patient left: in bed;with call bell/phone within reach     Time: 0947-1012 PT Time Calculation (min) (ACUTE ONLY): 25 min  Charges:  $Gait Training: 8-22 mins $Therapeutic  Activity: 8-22 mins                    G Codes:      Rica Koyanagi  PTA [facility]  Acute  Rehab Pager      [phone]

## 2015-01-24 NOTE — Op Note (Signed)
NAME:  Christine, [name]:  [id number]  MEDICAL RECORD NO.:  [id number]  LOCATION:  8366                         FACILITY:  [facility]  PHYSICIAN:  Gaynelle Arabian, M.D.    DATE OF BIRTH:  1951-04-11  DATE OF PROCEDURE:  01/23/2015 DATE OF DISCHARGE:                              OPERATIVE REPORT   PREOPERATIVE DIAGNOSIS:  Loose acetabular component, left total hip arthroplasty.  POSTOPERATIVE DIAGNOSIS:  Loose acetabular component, left total hip arthroplasty.  PROCEDURE:  Left hip acetabular revision.  SURGEON:  Gaynelle Arabian, M.D.  ASSISTANT:  Alexzandrew L. Perkins, P.A.C.  ANESTHESIA:  Spinal.  ESTIMATED BLOOD LOSS:  250 mL.  DRAINS:  Hemovac x1.  COMPLICATIONS:  None.  CONDITION:  Stable to recovery.  BRIEF CLINICAL NOTE:  Christine [name] is a 64 year old female, who underwent total hip arthroplasty few years ago.  She has had persistent groin pain since.  Plain radiographs suggested possible acetabular loosening with a lucency around the components.  She had a bone scan done which was unremarkable.  She had an MRI done which did show a lucency behind the acetabular shell.  Given her significant pain and symptoms classic for acetabular loosening, it was decided to perform an acetabular versus total hip revision.  She presents now for the above-mentioned procedure.  PROCEDURE IN DETAIL:  After successful administration of spinal anesthetic, the patient was placed in the right lateral decubitus position with the left side up and held with the hip positioner.  The left lower extremity was isolated from her perineum with plastic drapes and prepped and draped in the usual sterile fashion.  Posterolateral incision was made with a 10 blade through the subcutaneous tissue to the fascia lata which was incised in line with skin incision.  Sciatic nerve was palpated and protected.  Posterior pseudocapsule was then elevated off the femur.  There was some clear fluid in  the joint.  The tissue around the edge of acetabular component was then removed to expose the edge of the component.  We then dislocated the hip and removed the femoral head which was a ceramic head.  The femoral component was in excellent position and was well fixed.  Femur was then retracted anteriorly to gain acetabular exposure.  Acetabular retractors were placed to enhance this exposure.  I inspected and cleared the soft tissue around the edge of acetabular component.  I placed a screw into the acetabular liner to dislodge the liner from the acetabular shell.  After I did this, it was noted that the acetabular shell was grossly loose.  This was just being held in place by 2 screws.  The screws were removed then the shell was then easily removed.  There was a 50 mm shell.  There was some posterior column deficiency but not complete disruption.  I began reaming centrally 48 mm going up in increments of 2 to 53 mm and then a 54 mm pinnacle multi-hole acetabular cup was impacted into the acetabulum in anatomic position with good purchase and then I placed additional 4 dome screws with outstanding purchase.  I re-threaded the impactor handle into the acetabular shell and the shell and pelvis were moving as  a single unit with no evidence of any loosening between the two.  The handle was then removed.  The shell was thoroughly irrigated with saline solution.  Then, a 36 mm neutral +4 Marathon liner was placed into the acetabular shell.  We then used a trial heads for the Stryker secure fit stem.  I tried a 36+ 5.  The hips reduced with outstanding stability with full extension, full external rotation, 70 degrees flexion, 40 degrees adduction, 90 degrees internal rotation, 90 degrees of flexion, and 70 degrees of internal rotation.  By placing the left leg on top of the right, the leg lengths were equal.  Hip was then dislocated and trial head removed.  The permanent 36+ 5 metal head was  then placed onto the femoral neck and the hip reduced with the same stability parameters. The wound was copiously irrigated with saline solution and short rotators and capsule reattached to the femur through drill holes with Ethibond suture.  A 20 mL of Exparel mixed with 40 mL of saline was then injected into the fascia lata, gluteal muscles, and subcu tissues. Additional 20 mL of 0.25% Marcaine was injected into the same tissues. Fascia lata was closed over Hemovac drain with a running #1 V-Loc suture.  Subcu was closed with another V-Loc suture and interrupted 2-0 Vicryl.  Subcuticular was closed with running 4-0 Monocryl.  The drain was hooked to suction.  Incision cleaned and dried and Steri-Strips and a bulky sterile dressing applied.  She was then awakened and transported to recovery in stable condition.  Note that the surgical assistant was a medical necessity for this procedure.  Assistance was necessary for proper positioning of the leg and for retraction and protection of the vital neurovascular structures. Also necessary for appropriate exposure of the acetabulum for safe removal of the old components and safe  placement of a new component.     Gaynelle Arabian, M.D.     FA/MEDQ  D:  01/23/2015  T:  01/24/2015  Job:  [id number]

## 2015-01-24 NOTE — Discharge Summary (Signed)
Physician Discharge Summary   Patient ID: Christine [name] MRN: [id number] DOB/AGE: 06-19-1951 64 y.o.  Admit date: 01/23/2015 Discharge date: 01/25/2015  Primary Diagnosis:  loose left total hip arthroplasty  Admission Diagnoses:  Past Medical History  Diagnosis Date  . Blood transfusion 1970's    bleeding  after a fertility exploratory surgery   . GERD (gastroesophageal reflux disease)   . Depression   . Bronchitis     "once"  . High cholesterol   . Arthritis     "q joint in my body; it's eating me up" (07/27/2012)  . Chronic lower back pain 07/27/2012    "they said I need another fusion right now"  . Complication of anesthesia 1990's    "didn't have me totally asleep when they put ETT in; very traumatic"  . PONV (postoperative nausea and vomiting)   . Urinary tract infection     hx of   . Anemia 2012    hx of    Discharge Diagnoses:   Principal Problem:   Failed total hip arthroplasty Active Problems:   OA (osteoarthritis) of hip  Estimated body mass index is 30.12 kg/(m^2) as calculated from the following:   Height as of this encounter: $RemoveBeforeD'5\' 5"'nIruPnPQxdENdW$  (1.651 m).   Weight as of this encounter: 82.101 kg (181 lb).  Procedure(s) (LRB): LEFT HIP ACETABULAR  REVISION (Left)   Consults: None  HPI: Christine [name] is a 64 year old female, who underwent total hip arthroplasty few years ago. She has had persistent groin pain since. Plain radiographs suggested possible acetabular loosening with a lucency around the components. She had a bone scan done which was unremarkable. She had an MRI done which did show a lucency behind the acetabular shell. Given her significant pain and symptoms classic for acetabular loosening, it was decided to perform an acetabular versus total hip revision. She presents now for the above-mentioned procedure.  Laboratory Data: Admission on 01/23/2015, Discharged on 01/25/2015  Component Date Value Ref Range Status  . WBC 01/24/2015 10.5  4.0 - 10.5 K/uL  Final  . RBC 01/24/2015 3.25* 3.87 - 5.11 MIL/uL Final  . Hemoglobin 01/24/2015 9.7* 12.0 - 15.0 g/dL Final  . HCT 01/24/2015 30.4* 36.0 - 46.0 % Final  . MCV 01/24/2015 93.5  78.0 - 100.0 fL Final  . MCH 01/24/2015 29.8  26.0 - 34.0 pg Final  . MCHC 01/24/2015 31.9  30.0 - 36.0 g/dL Final  . RDW 01/24/2015 13.2  11.5 - 15.5 % Final  . Platelets 01/24/2015 273  150 - 400 K/uL Final  . Sodium 01/24/2015 137  135 - 145 mmol/L Final  . Potassium 01/24/2015 4.0  3.5 - 5.1 mmol/L Final  . Chloride 01/24/2015 105  96 - 112 mmol/L Final  . CO2 01/24/2015 26  19 - 32 mmol/L Final  . Glucose, Bld 01/24/2015 117* 70 - 99 mg/dL Final  . BUN 01/24/2015 10  6 - 23 mg/dL Final  . Creatinine, Ser 01/24/2015 0.62  0.50 - 1.10 mg/dL Final  . Calcium 01/24/2015 8.1* 8.4 - 10.5 mg/dL Final  . GFR calc non Af Amer 01/24/2015 >90  >90 mL/min Final  . GFR calc Af Amer 01/24/2015 >90  >90 mL/min Final   Comment: (NOTE) The eGFR has been calculated using the CKD EPI equation. This calculation has not been validated in all clinical situations. eGFR's persistently <90 mL/min signify possible Chronic Kidney Disease.   . Anion gap 01/24/2015 6  5 - 15 Final  . WBC 01/25/2015 9.2  4.0 - 10.5 K/uL Final  . RBC 01/25/2015 3.00* 3.87 - 5.11 MIL/uL Final  . Hemoglobin 01/25/2015 9.1* 12.0 - 15.0 g/dL Final  . HCT 01/25/2015 28.2* 36.0 - 46.0 % Final  . MCV 01/25/2015 94.0  78.0 - 100.0 fL Final  . MCH 01/25/2015 30.3  26.0 - 34.0 pg Final  . MCHC 01/25/2015 32.3  30.0 - 36.0 g/dL Final  . RDW 01/25/2015 13.5  11.5 - 15.5 % Final  . Platelets 01/25/2015 248  150 - 400 K/uL Final  . Sodium 01/25/2015 145  135 - 145 mmol/L Final   Comment: DELTA CHECK NOTED REPEATED TO VERIFY   . Potassium 01/25/2015 4.0  3.5 - 5.1 mmol/L Final  . Chloride 01/25/2015 109  96 - 112 mmol/L Final  . CO2 01/25/2015 30  19 - 32 mmol/L Final  . Glucose, Bld 01/25/2015 97  70 - 99 mg/dL Final  . BUN 01/25/2015 9  6 - 23 mg/dL Final    . Creatinine, Ser 01/25/2015 0.60  0.50 - 1.10 mg/dL Final  . Calcium 01/25/2015 8.7  8.4 - 10.5 mg/dL Final  . GFR calc non Af Amer 01/25/2015 >90  >90 mL/min Final  . GFR calc Af Amer 01/25/2015 >90  >90 mL/min Final   Comment: (NOTE) The eGFR has been calculated using the CKD EPI equation. This calculation has not been validated in all clinical situations. eGFR's persistently <90 mL/min signify possible Chronic Kidney Disease.   Georgiann Hahn gap 01/25/2015 6  5 - 15 Final  Hospital Outpatient Visit on 01/16/2015  Component Date Value Ref Range Status  . aPTT 01/16/2015 30  24 - 37 seconds Final  . WBC 01/16/2015 6.1  4.0 - 10.5 K/uL Final  . RBC 01/16/2015 3.85* 3.87 - 5.11 MIL/uL Final  . Hemoglobin 01/16/2015 11.6* 12.0 - 15.0 g/dL Final  . HCT 01/16/2015 35.7* 36.0 - 46.0 % Final  . MCV 01/16/2015 92.7  78.0 - 100.0 fL Final  . MCH 01/16/2015 30.1  26.0 - 34.0 pg Final  . MCHC 01/16/2015 32.5  30.0 - 36.0 g/dL Final  . RDW 01/16/2015 13.1  11.5 - 15.5 % Final  . Platelets 01/16/2015 291  150 - 400 K/uL Final  . Sodium 01/16/2015 142  135 - 145 mmol/L Final  . Potassium 01/16/2015 3.8  3.5 - 5.1 mmol/L Final  . Chloride 01/16/2015 105  96 - 112 mmol/L Final  . CO2 01/16/2015 29  19 - 32 mmol/L Final  . Glucose, Bld 01/16/2015 96  70 - 99 mg/dL Final  . BUN 01/16/2015 16  6 - 23 mg/dL Final  . Creatinine, Ser 01/16/2015 0.72  0.50 - 1.10 mg/dL Final  . Calcium 01/16/2015 8.9  8.4 - 10.5 mg/dL Final  . Total Protein 01/16/2015 6.9  6.0 - 8.3 g/dL Final  . Albumin 01/16/2015 3.5  3.5 - 5.2 g/dL Final  . AST 01/16/2015 19  0 - 37 U/L Final  . ALT 01/16/2015 15  0 - 35 U/L Final  . Alkaline Phosphatase 01/16/2015 83  39 - 117 U/L Final  . Total Bilirubin 01/16/2015 0.5  0.3 - 1.2 mg/dL Final  . GFR calc non Af Amer 01/16/2015 89* >90 mL/min Final  . GFR calc Af Amer 01/16/2015 >90  >90 mL/min Final   Comment: (NOTE) The eGFR has been calculated using the CKD EPI equation. This  calculation has not been validated in all clinical situations. eGFR's persistently <90 mL/min signify possible Chronic Kidney Disease.   Marland Kitchen  Anion gap 01/16/2015 8  5 - 15 Final  . Prothrombin Time 01/16/2015 13.4  11.6 - 15.2 seconds Final  . INR 01/16/2015 1.01  0.00 - 1.49 Final  . ABO/RH(D) 01/16/2015 B POS   Final  . Antibody Screen 01/16/2015 NEG   Final  . Sample Expiration 01/16/2015 01/26/2015   Final  . Color, Urine 01/16/2015 YELLOW  YELLOW Final  . APPearance 01/16/2015 CLOUDY* CLEAR Final  . Specific Gravity, Urine 01/16/2015 1.016  1.005 - 1.030 Final  . pH 01/16/2015 6.0  5.0 - 8.0 Final  . Glucose, UA 01/16/2015 NEGATIVE  NEGATIVE mg/dL Final  . Hgb urine dipstick 01/16/2015 NEGATIVE  NEGATIVE Final  . Bilirubin Urine 01/16/2015 LARGE* NEGATIVE Final  . Ketones, ur 01/16/2015 NEGATIVE  NEGATIVE mg/dL Final  . Protein, ur 01/16/2015 NEGATIVE  NEGATIVE mg/dL Final  . Urobilinogen, UA 01/16/2015 0.2  0.0 - 1.0 mg/dL Final  . Nitrite 01/16/2015 POSITIVE* NEGATIVE Final  . Leukocytes, UA 01/16/2015 MODERATE* NEGATIVE Final  . MRSA, PCR 01/16/2015 NEGATIVE  NEGATIVE Final  . Staphylococcus aureus 01/16/2015 NEGATIVE  NEGATIVE Final   Comment:        The Xpert SA Assay (FDA approved for NASAL specimens in patients over 106 years of age), is one component of a comprehensive surveillance program.  Test performance has been validated by [facility] for patients greater than or equal to 9 year old. It is not intended to diagnose infection nor to guide or monitor treatment.   . ABO/RH(D) 01/16/2015 B POS   Final  . Squamous Epithelial / LPF 01/16/2015 RARE  RARE Final  . WBC, UA 01/16/2015 7-10  <3 WBC/hpf Final  . RBC / HPF 01/16/2015 0-2  <3 RBC/hpf Final  . Bacteria, UA 01/16/2015 MANY* RARE Final     X-Rays:Dg Pelvis Portable  01/23/2015   CLINICAL DATA:  Postop left total hip replacement  EXAM: PORTABLE PELVIS 1-2 VIEWS  COMPARISON:  None.  FINDINGS: Changes of  left total hip replacement. Soft tissue drain present laterally. Normal AP alignment. No hardware or bony complicating feature. No acute bony abnormality.  IMPRESSION: Left hip replacement.  No complicating feature.   Electronically Signed   By: Rolm Baptise M.D.   On: 01/23/2015 12:09    EKG: Orders placed or performed during the hospital encounter of 04/13/12  . EKG     Hospital Course: Patient was admitted to [facility] and taken to the OR and underwent the above state procedure without complications.  Patient tolerated the procedure well and was later transferred to the recovery room and then to the orthopaedic floor for postoperative care.  They were given PO and IV analgesics for pain control following their surgery.  They were given 24 hours of postoperative antibiotics of  Anti-infectives    Start     Dose/Rate Route Frequency Ordered Stop   01/23/15 1600  ceFAZolin (ANCEF) IVPB 2 g/50 mL premix     2 g 100 mL/hr over 30 Minutes Intravenous Every 6 hours 01/23/15 1327 01/23/15 2302   01/23/15 0736  ceFAZolin (ANCEF) IVPB 2 g/50 mL premix     2 g 100 mL/hr over 30 Minutes Intravenous On call to O.R. 01/23/15 4650 01/23/15 0933     and started on DVT prophylaxis in the form of Xarelto.   PT and OT were ordered for total hip protocol.  The patient was allowed to be WBAT with therapy. Discharge planning was consulted to help with postop disposition and equipment  needs.  Patient had a good night on the evening of surgery and walked about 30 feet.  They started to get up OOB with therapy on day one.  Hemovac drain was pulled without difficulty.  The knee immobilizer was removed and discontinued.  Continued to work with therapy into day two.  Dressing was changed on day two and the incision was healing well. Patient was seen in rounds and was ready to go home.   Diet: Cardiac diet Activity:WBAT No bending hip over 90 degrees- A "L" Angle Do not cross legs Do not let foot roll  inward When turning these patients a pillow should be placed between the patient's legs to prevent crossing. Patients should have the affected knee fully extended when trying to sit or stand from all surfaces to prevent excessive hip flexion. When ambulating and turning toward the affected side the affected leg should have the toes turned out prior to moving the walker and the rest of patient's body as to prevent internal rotation/ turning in of the leg. Abduction pillows are the most effective way to prevent a patient from not crossing legs or turning toes in at rest. If an abduction pillow is not ordered placing a regular pillow length wise between the patient's legs is also an effective reminder. It is imperative that these precautions be maintained so that the surgical hip does not dislocate. Follow-up:in 2 weeks Disposition - Home Discharged Condition: good       Discharge Instructions    Call MD / Call 911    Complete by:  As directed   If you experience chest pain or shortness of breath, CALL 911 and be transported to the hospital emergency room.  If you develope a fever above 101 F, pus (white drainage) or increased drainage or redness at the wound, or calf pain, call your surgeon's office.     Change dressing    Complete by:  As directed   You may change your dressing dressing daily with sterile 4 x 4 inch gauze dressing and paper tape.  Do not submerge the incision under water.     Constipation Prevention    Complete by:  As directed   Drink plenty of fluids.  Prune juice may be helpful.  You may use a stool softener, such as Colace (over the counter) 100 mg twice a day.  Use MiraLax (over the counter) for constipation as needed.     Diet general    Complete by:  As directed      Discharge instructions    Complete by:  As directed   Pick up stool softner and laxative for home use following surgery while on pain medications. Do not submerge incision under water. Please use good  hand washing techniques while changing dressing each day. May shower starting three days after surgery. Please use a clean towel to pat the incision dry following showers. Continue to use ice for pain and swelling after surgery. Do not use any lotions or creams on the incision until instructed by your surgeon. Hip precautions.  Total Hip Protocol.  Take Xarelto for two and a half more weeks, then discontinue Xarelto. Once the patient has completed the blood thinner regimen, then take a Baby 81 mg Aspirin daily for three more weeks.  Postoperative Constipation Protocol  Constipation - defined medically as fewer than three stools per week and severe constipation as less than one stool per week.  One of the most common issues patients have following surgery  is constipation.  Even if you have a regular bowel pattern at home, your normal regimen is likely to be disrupted due to multiple reasons following surgery.  Combination of anesthesia, postoperative narcotics, change in appetite and fluid intake all can affect your bowels.  In order to avoid complications following surgery, here are some recommendations in order to help you during your recovery period.  Colace (docusate) - Pick up an over-the-counter form of Colace or another stool softener and take twice a day as long as you are requiring postoperative pain medications.  Take with a full glass of water daily.  If you experience loose stools or diarrhea, hold the colace until you stool forms back up.  If your symptoms do not get better within 1 week or if they get worse, check with your doctor.  Dulcolax (bisacodyl) - Pick up over-the-counter and take as directed by the product packaging as needed to assist with the movement of your bowels.  Take with a full glass of water.  Use this product as needed if not relieved by Colace only.   MiraLax (polyethylene glycol) - Pick up over-the-counter to have on hand.  MiraLax is a solution that will  increase the amount of water in your bowels to assist with bowel movements.  Take as directed and can mix with a glass of water, juice, soda, coffee, or tea.  Take if you go more than two days without a movement. Do not use MiraLax more than once per day. Call your doctor if you are still constipated or irregular after using this medication for 7 days in a row.  If you continue to have problems with postoperative constipation, please contact the office for further assistance and recommendations.  If you experience "the worst abdominal pain ever" or develop nausea or vomiting, please contact the office immediatly for further recommendations for treatment.     Do not sit on low chairs, stoools or toilet seats, as it may be difficult to get up from low surfaces    Complete by:  As directed      Driving restrictions    Complete by:  As directed   No driving until released by the physician.     Follow the hip precautions as taught in Physical Therapy    Complete by:  As directed      Increase activity slowly as tolerated    Complete by:  As directed      Lifting restrictions    Complete by:  As directed   No lifting until released by the physician.     Patient may shower    Complete by:  As directed   You may shower without a dressing once there is no drainage.  Do not wash over the wound.  If drainage remains, do not shower until drainage stops.     TED hose    Complete by:  As directed   Use stockings (TED hose) for 3 weeks on both leg(s).  You may remove them at night for sleeping.     Weight bearing as tolerated    Complete by:  As directed   Laterality:  left  Extremity:  Lower            Medication List    STOP taking these medications        estrogens (conjugated) 0.9 MG tablet  Commonly known as:  PREMARIN     etodolac 500 MG tablet  Commonly known as:  LODINE  OMEGA 3 PO     Vitamin D 2000 UNITS tablet      TAKE these medications        DULoxetine 60 MG capsule    Commonly known as:  CYMBALTA  Take 60 mg by mouth every morning.     guaiFENesin 100 MG/5ML liquid  Commonly known as:  ROBITUSSIN  Take 200 mg by mouth 3 (three) times daily as needed for cough.     methocarbamol 500 MG tablet  Commonly known as:  ROBAXIN  Take 1 tablet (500 mg total) by mouth every 6 (six) hours as needed for muscle spasms.     oxyCODONE 5 MG immediate release tablet  Commonly known as:  Oxy IR/ROXICODONE  Take 1-2 tablets (5-10 mg total) by mouth every 3 (three) hours as needed for moderate pain, severe pain or breakthrough pain.     rivaroxaban 10 MG Tabs tablet  Commonly known as:  XARELTO  - Take 1 tablet (10 mg total) by mouth daily with breakfast. Take Xarelto for two and a half more weeks, then discontinue Xarelto.  - Once the patient has completed the blood thinner regimen, then take a Baby 81 mg Aspirin daily for three more weeks.     Tiotropium Bromide Monohydrate 2.5 MCG/ACT Aers  Commonly known as:  SPIRIVA RESPIMAT  Inhale 2 puffs into the lungs daily.       Follow-up Information    Follow up with Gearlean Alf, MD. Schedule an appointment as soon as possible for a visit on 02/05/2015.   Specialty:  Orthopedic Surgery   Why:  Call office at [phone] to set up appointment wtih Dr. Wynelle Link.   Contact information:   [address] [place] [address] [phone]       Follow up with [facility].   Why:  home health physical therapy   Contact information:   [address] [place] [address] [phone]       Signed: Arlee Muslim, PA-C Orthopaedic Surgery 01/30/2015, 10:19 PM

## 2015-01-25 LAB — BASIC METABOLIC PANEL
ANION GAP: 6 (ref 5–15)
BUN: 9 mg/dL (ref 6–23)
CO2: 30 mmol/L (ref 19–32)
CREATININE: 0.6 mg/dL (ref 0.50–1.10)
Calcium: 8.7 mg/dL (ref 8.4–10.5)
Chloride: 109 mmol/L (ref 96–112)
GFR calc Af Amer: >90 mL/min (ref >90)
GFR calc non Af Amer: >90 mL/min (ref >90)
Glucose, Bld: 97 mg/dL (ref 70–99)
Potassium: 4 mmol/L (ref 3.5–5.1)
Sodium: 145 mmol/L (ref 135–145)

## 2015-01-25 LAB — CBC
HCT: 28.2 % — ABNORMAL LOW (ref 36.0–46.0)
Hemoglobin: 9.1 g/dL — ABNORMAL LOW (ref 12.0–15.0)
MCH: 30.3 pg (ref 26.0–34.0)
MCHC: 32.3 g/dL (ref 30.0–36.0)
MCV: 94 fL (ref 78.0–100.0)
Platelets: 248 10*3/uL (ref 150–400)
RBC: 3 MIL/uL — ABNORMAL LOW (ref 3.87–5.11)
RDW: 13.5 % (ref 11.5–15.5)
WBC: 9.2 10*3/uL (ref 4.0–10.5)

## 2015-01-25 NOTE — Progress Notes (Signed)
Physical Therapy Treatment Patient Details Name: Christine [name] MRN: [id number] DOB: 30-Aug-1951 Today's Date: 01/25/2015    History of Present Illness L THR revision    PT Comments    POD # 3 am session.  Pt feeling better.  amb a great distance in hallway, practiced 4 steps using one rail.  Performed all THR TE's following HEP handout then applied ICE. Pt ready for D/C to home.  Follow Up Recommendations  Home health PT     Equipment Recommendations  None recommended by PT    Recommendations for Other Services       Precautions / Restrictions Precautions Precautions: Posterior Hip;Fall Precaution Comments: pt recalls 3/3 THP Restrictions Weight Bearing Restrictions: No    Mobility  Bed Mobility               General bed mobility comments: pt OOB in recliner  Transfers Overall transfer level: Needs assistance Equipment used: Rolling walker (2 wheeled) Transfers: Sit to/from Stand Sit to Stand: Supervision         General transfer comment: good use of hands  Ambulation/Gait Ambulation/Gait assistance: Supervision Ambulation Distance (Feet): 125 Feet Assistive device: Rolling walker (2 wheeled) Gait Pattern/deviations: Step-to pattern Gait velocity: decreased   General Gait Details: good alternating gait   Stairs Stairs: Yes Stairs assistance: Min guard Stair Management: One rail Left;Step to pattern;Forwards Number of Stairs: 4 General stair comments: one initial VC on proper sequencing and safety.   Performed well.  Wheelchair Mobility    Modified Rankin (Stroke Patients Only)       Balance                                    Cognition                            Exercises   Total Hip Replacement TE's 10 reps ankle pumps 10 reps knee presses 10 reps heel slides 10 reps SAQ's 10 reps ABD Followed by ICE     General Comments        Pertinent Vitals/Pain Pain Assessment: 0-10 Pain Score: 3  Pain  Location: L hip Pain Descriptors / Indicators: Aching Pain Intervention(s): Monitored during session;Premedicated before session;Repositioned;Ice applied    Home Living                      Prior Function            PT Goals (current goals can now be found in the care plan section) Progress towards PT goals: Progressing toward goals    Frequency  7X/week    PT Plan      Co-evaluation             End of Session Equipment Utilized During Treatment: Gait belt Activity Tolerance: Patient tolerated treatment well Patient left: in chair;with call bell/phone within reach;with family/visitor present     Time: 0935-1000 PT Time Calculation (min) (ACUTE ONLY): 25 min  Charges:  $Gait Training: 8-22 mins $Therapeutic Exercise: 8-22 mins                    G Codes:      Rica Koyanagi  PTA [facility]  Acute  Rehab Pager      [phone]

## 2015-01-25 NOTE — Progress Notes (Signed)
OT Cancellation Note  Patient Details Name: Christine [name] MRN: [id number] DOB: 03-27-1951   Cancelled Treatment:    Reason Eval/Treat Not Completed: Other (comment).  Pt has no further questions for OT.  Feels comfortable with bathroom transfers.  Exchanged reacher for longer one.  Leviticus Harton 01/25/2015, 8:38 AM  Lesle Chris, OTR/L [phone] 01/25/2015

## 2015-10-14 ENCOUNTER — Ambulatory Visit (INDEPENDENT_AMBULATORY_CARE_PROVIDER_SITE_OTHER): Payer: 59 | Admitting: Podiatry

## 2015-10-14 ENCOUNTER — Ambulatory Visit (INDEPENDENT_AMBULATORY_CARE_PROVIDER_SITE_OTHER): Payer: 59

## 2015-10-14 ENCOUNTER — Encounter: Payer: Self-pay | Admitting: Podiatry

## 2015-10-14 VITALS — BP 164/92 | HR 83 | Resp 18

## 2015-10-14 DIAGNOSIS — R52 Pain, unspecified: Secondary | ICD-10-CM

## 2015-10-14 DIAGNOSIS — M2042 Other hammer toe(s) (acquired), left foot: Secondary | ICD-10-CM | POA: Diagnosis not present

## 2015-10-14 NOTE — Patient Instructions (Signed)
Pre-Operative Instructions  Congratulations, you have decided to take an important step to improving your quality of life.  You can be assured that the doctors of [facility] will be with you every step of the way.  1. Plan to be at the surgery center/hospital at least 1 (one) hour prior to your scheduled time unless otherwise directed by the surgical center/hospital staff.  You must have a responsible adult accompany you, remain during the surgery and drive you home.  Make sure you have directions to the surgical center/hospital and know how to get there on time. 2. For hospital based surgery you will need to obtain a history and physical form from your family physician within 1 month prior to the date of surgery- we will give you a form for you primary physician.  3. We make every effort to accommodate the date you request for surgery.  There are however, times where surgery dates or times have to be moved.  We will contact you as soon as possible if a change in schedule is required.   4. No Aspirin/Ibuprofen for one week before surgery.  If you are on aspirin, any non-steroidal anti-inflammatory medications (Mobic, Aleve, Ibuprofen) you should stop taking it 7 days prior to your surgery.  You make take Tylenol  For pain prior to surgery.  5. Medications- If you are taking daily heart and blood pressure medications, seizure, reflux, allergy, asthma, anxiety, pain or diabetes medications, make sure the surgery center/hospital is aware before the day of surgery so they may notify you which medications to take or avoid the day of surgery. 6. No food or drink after midnight the night before surgery unless directed otherwise by surgical center/hospital staff. 7. No alcoholic beverages 24 hours prior to surgery.  No smoking 24 hours prior to or 24 hours after surgery. 8. Wear loose pants or shorts- loose enough to fit over bandages, boots, and casts. 9. No slip on shoes, sneakers are best. 10. Bring  your boot with you to the surgery center/hospital.  Also bring crutches or a walker if your physician has prescribed it for you.  If you do not have this equipment, it will be provided for you after surgery. 11. If you have not been contracted by the surgery center/hospital by the day before your surgery, call to confirm the date and time of your surgery. 12. Leave-time from work may vary depending on the type of surgery you have.  Appropriate arrangements should be made prior to surgery with your employer. 13. Prescriptions will be provided immediately following surgery by your doctor.  Have these filled as soon as possible after surgery and take the medication as directed. 14. Remove nail polish on the operative foot. 15. Wash the night before surgery.  The night before surgery wash the foot and leg well with the antibacterial soap provided and water paying special attention to beneath the toenails and in between the toes.  Rinse thoroughly with water and dry well with a towel.  Perform this wash unless told not to do so by your physician.  Enclosed: 1 Ice pack (please put in freezer the night before surgery)   1 Hibiclens skin cleaner   Pre-op Instructions  If you have any questions regarding the instructions, do not hesitate to call our office.  [place]: [address] [place], [place] [address] [phone]  [place]: [address] [place], [place] [address] [phone]  [place]: [address]  , [place] [address] [phone]  Dr. Richard   Tuchman DPM, Dr. Norman Regal DPM Dr. Richard Sikora DPM, Dr. M. Todd Hyatt DPM, Dr. Kathryn Egerton DPM 

## 2015-10-14 NOTE — Progress Notes (Signed)
Subjective:    Patient ID: Christine [name], female    DOB: 12-04-1950, 64 y.o.   MRN: [id number]  HPI  64 year old female presents the office today with concerns of left foot pain which is been ongoing the last several months. She states that she has pain under the third toe of the left foot particularly greater pressure. She states that she is starting to develop a hammertoe. She also has pain to her fourth toe and she states it is separating the third toe and becoming curled. She denies any numbness or tingling. She has tried offloading pads in shoe changes without any resolution of symptoms. She continues have pain on daily basis which the underlying the third toe. She also gets sore in between her fourth and fifth toe on the right foot as well however, symptomatic. She says all her toes on her right foot are turning inwards but not as symptomatic as the left. No other complaints at this time.    Review of Systems  All other systems reviewed and are negative.      Objective:   Physical Exam  General: AAO x3, NAD  Dermatological: Skin is warm, dry and supple bilateral. Nails x 10 are well manicured; remaining integument appears unremarkable at this time. There are no open sores, no preulcerative lesions, no rash or signs of infection present.  Vascular: Dorsalis Pedis artery and Posterior Tibial artery pedal pulses are 2/4 bilateral with immedate capillary fill time. Pedal hair growth present. No varicosities and no lower extremity edema present bilateral. There is no pain with calf compression, swelling, warmth, erythema.   Neruologic: Grossly intact via light touch bilateral. Vibratory intact via tuning fork bilateral. Protective threshold with Semmes Wienstein monofilament intact to all pedal sites bilateral. Patellar and Achilles deep tendon reflexes 2+ bilateral. No Babinski or clonus noted bilateral.   Musculoskeletal: There is hammertoe contracture the left third digit and there is  adductovarus of the fourth digit on the left side there does appear to be splaying of the third and fourth toes. There does not appear to be any palpable neuroma and there is no pain along the interspace. There is no subjective numbness or tingling to the toes. Upon weightbearing. Contracture becomes more evident with the third digit contracture as well as adductovarus of the fourth toe. There is been previous surgery overlying the bunion and a tailor's bunion on the left foot. On the right foot there is adductovarus of the third, fourth, fifth digit there does appear to be some skin irritation between the fourth and fifth toes have there is no ulceration or significant hyperkeratotic tissue at this time. There is a decrease in medial arch height upon weightbearing. No gross boney pedal deformities bilateral. No pain, crepitus, or limitation noted with foot and ankle range of motion bilateral. Muscular strength 5/5 in all groups tested bilateral.  Gait: Unassisted, Nonantalgic.      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 There is no definitive fracture noted. Hammertoe contractures present. -Treatment options discussed including all alternatives, risks, and complications -Etiology of symptoms were discussed -At this time shows a question surgical intervention to help decrease her pain and deformity to the left third and fourth toe. -I discussed further hammertoe correction with PIPJ arthrodesis and K wire fixation of the third and fourth toe. I also discussed with her pinning of the toes and to the metatarsal on  the third toe. She understands that she'll have a wire sticking out of her toe for about 6 weeks. -The incision placement as well as the postoperative course was discussed with the patient. I discussed risks of the surgery which include, but not limited to, infection, bleeding, pain,  swelling, need for further surgery, delayed or nonhealing, painful or ugly scar, numbness or sensation changes, over/under correction, recurrence, transfer lesions, further deformity, hardware failure, DVT/PE, loss of toe/foot. Patient understands these risks and wishes to proceed with surgery. The surgical consent was reviewed with the patient all 3 pages were signed. No promises or guarantees were given to the outcome of the procedure. All questions were answered to the best of my ability. Before the surgery the patient was encouraged to call the office if there is any further questions. The surgery will be performed at the [facility] on an outpatient basis.  [name], DPM

## 2015-10-15 ENCOUNTER — Telehealth: Payer: Self-pay | Admitting: *Deleted

## 2015-10-16 NOTE — Telephone Encounter (Signed)
"  Wanted to see if Dr. Jacqualyn Posey has time for my foot surgery on the 30th.  If you could call me back."  I'm returning your call.  You want to schedule your surgery for the 30th.  It is available.  "That would be great, it fits in with my schedule at work."  [employer], go ahead Programmer, applications.  Someone from the surgical center will call you with the arrival time a day or two prior to surgery.  "Thank you so much."

## 2015-10-28 ENCOUNTER — Telehealth: Payer: Self-pay | Admitting: *Deleted

## 2015-10-28 NOTE — Telephone Encounter (Signed)
Authorization was obtained for surgery scheduled for 10/30/2015 for Hammer Toe 3, 4 left foot by [facility].  Authorization number is [id number].  Authorization was faxed to Caren Griffins at [facility].  I called and informed Corene Cornea that I only received authorization for one toe.  He submitted another request for me and cross referenced them so it wouldn't be mistaken as the same procedure.  The new reference number is [id number].  He suggested clinicals be sent.  He expedited the request for me.  I faxed the clinical notes to [facility] as requested for [id number].

## 2015-10-30 DIAGNOSIS — M2042 Other hammer toe(s) (acquired), left foot: Secondary | ICD-10-CM | POA: Diagnosis not present

## 2015-11-01 NOTE — Telephone Encounter (Signed)
I called and informed Christine [name] at [facility] that patient's other toe was authorized by [facility].  The authorization number is  [id number] and the other is [id number].

## 2015-11-08 ENCOUNTER — Encounter: Payer: Self-pay | Admitting: Podiatry

## 2015-11-08 ENCOUNTER — Ambulatory Visit (INDEPENDENT_AMBULATORY_CARE_PROVIDER_SITE_OTHER): Payer: 59

## 2015-11-08 ENCOUNTER — Ambulatory Visit (INDEPENDENT_AMBULATORY_CARE_PROVIDER_SITE_OTHER): Payer: 59 | Admitting: Podiatry

## 2015-11-08 VITALS — BP 93/55 | HR 87 | Resp 18

## 2015-11-08 DIAGNOSIS — M2042 Other hammer toe(s) (acquired), left foot: Secondary | ICD-10-CM | POA: Diagnosis not present

## 2015-11-08 DIAGNOSIS — Z9889 Other specified postprocedural states: Secondary | ICD-10-CM

## 2015-11-11 ENCOUNTER — Ambulatory Visit (INDEPENDENT_AMBULATORY_CARE_PROVIDER_SITE_OTHER): Payer: 59 | Admitting: Podiatry

## 2015-11-11 ENCOUNTER — Ambulatory Visit (INDEPENDENT_AMBULATORY_CARE_PROVIDER_SITE_OTHER): Payer: 59

## 2015-11-11 DIAGNOSIS — Z9889 Other specified postprocedural states: Secondary | ICD-10-CM | POA: Diagnosis not present

## 2015-11-11 DIAGNOSIS — M2040 Other hammer toe(s) (acquired), unspecified foot: Secondary | ICD-10-CM | POA: Insufficient documentation

## 2015-11-11 DIAGNOSIS — M2042 Other hammer toe(s) (acquired), left foot: Secondary | ICD-10-CM

## 2015-11-11 NOTE — Progress Notes (Signed)
[name], female   DOB: 24-Mar-1951, 64 y.o.   MRN: [id number]  Subjective: Christine [name] is a 64 y.o. is seen today in office s/p left 3rd and 4th digit hammertoe correction preformed on 10/30/15. They state their pain is minimal. She's been taking pain medication although minimal. She has been on antibiotics. Denies any systemic complaints such as fevers, chills, nausea, vomiting. No calf pain, chest pain, shortness of breath.   Objective: General: No acute distress, AAOx3  DP/PT pulses palpable 2/4, CRT < 3 sec to all digits.  Protective sensation intact. Motor function intact.  Left foot: Incision is well coapted without any evidence of dehiscence and sutures are intact. There is no surrounding erythema, ascending cellulitis, fluctuance, crepitus, malodor, drainage/purulence. There is mild edema around the surgical site. There is mild pain along the surgical site. K wires are intact the third and fourth toes without any evidence of drainage or redness. No other areas of tenderness to bilateral lower extremities.  No other open lesions or pre-ulcerative lesions.  No pain with calf compression, swelling, warmth, erythema.   Assessment and Plan:  Status post left foot surgery, doing well with no complications   -Treatment options discussed including all alternatives, risks, and complications -X-rays were obtained and reviewed with the patient. K wires intact. -Antibiotic ointment split over the incision followed by dry sterile dressing. Keep the dressing clean, dry, intact. -Ice/elevation -Continue CAM boot.  -Pain medication as needed. -Finish antibiotics. -Monitor for any clinical signs or symptoms of infection and DVT/PE and directed to call the office immediately should any occur or go to the ER. -Follow-up in 1 week for suture removal or sooner if any problems arise. In the meantime, encouraged to call the office with any questions, concerns, change in symptoms.    [name], DPM

## 2015-11-11 NOTE — Progress Notes (Signed)
Patient ID: Christine [name], female   DOB: Aug 16, 1951, 64 y.o.   MRN: [id number]  Subjective: Christine [name] is a 64 y.o. is seen today in office s/p left third and fourth hammertoe correction. She presents today for an unscheduled appointment she states that she get the dressing completely wet over the weekend and her parents of turned. She denies any recent injury or trauma. She has finished her antibiotics. She is no longer taking pain medication. Denies any systemic complaints such as fevers, chills, nausea, vomiting. No calf pain, chest pain, shortness of breath.   Objective: General: No acute distress, AAOx3; presents walking in CAM boot. DP/PT pulses palpable 2/4, CRT < 3 sec to all digits.  Protective sensation intact. Motor function intact.  Left foot: Incision is well coapted without any evidence of dehiscence and sutures are intact. There is no surrounding erythema, ascending cellulitis, fluctuance, crepitus, malodor, drainage/purulence. There is minimal edema around the surgical site. There is no signficant pain along the surgical site. The toe sits in rectus position. The K wires are slightly rotated however there not bend. No other areas of tenderness to bilateral lower extremities.  No other open lesions or pre-ulcerative lesions.  No pain with calf compression, swelling, warmth, erythema.   Assessment and Plan:  Status post left foot surgery  -Treatment options discussed including all alternatives, risks, and complications -X-rays were obtained and reviewed with the patient.  -The dressing was changed. Antibiotic ointment as well as over the incision followed by dry sterile dressing. Keep the dressing clean, dry, intact. -Ice/elevation -Pain medication as needed. -Monitor for any clinical signs or symptoms of infection and DVT/PE and directed to call the office immediately should any occur or go to the ER. -Follow-up as scheduled for suture removal or sooner if any problems  arise. In the meantime, encouraged to call the office with any questions, concerns, change in symptoms.   [name], DPM

## 2015-11-15 ENCOUNTER — Ambulatory Visit (INDEPENDENT_AMBULATORY_CARE_PROVIDER_SITE_OTHER): Payer: 59 | Admitting: Podiatry

## 2015-11-15 ENCOUNTER — Encounter: Payer: Self-pay | Admitting: Podiatry

## 2015-11-15 DIAGNOSIS — Z9889 Other specified postprocedural states: Secondary | ICD-10-CM | POA: Diagnosis not present

## 2015-11-15 DIAGNOSIS — M2042 Other hammer toe(s) (acquired), left foot: Secondary | ICD-10-CM

## 2015-11-17 NOTE — Progress Notes (Signed)
Patient ID: Christine [name], female   DOB: 05-09-1951, 64 y.o.   MRN: [id number]  Subjective: Christine [name] is a 64 y.o. is seen today in office s/p left third and fourth hammertoe correction. She presents today for suture removal. She states that her pain has decreased. She states that she was having some stinging to the toes but has decreased. She states that although surgery she's had to her feet that has been the least painful. Denies any systemic complaints such as fevers, chills, nausea, vomiting. No calf pain, chest pain, shortness of breath.   Objective: General: No acute distress, AAOx3; presents walking in CAM boot. DP/PT pulses palpable 2/4, CRT < 3 sec to all digits.  Protective sensation intact. Motor function intact.  Left foot: Incision is well coapted without any evidence of dehiscence and sutures are intact. There is no surrounding erythema, ascending cellulitis, fluctuance, crepitus, malodor, drainage/purulence. There is trace edema around the surgical site. There is no signficant pain along the surgical site. The toe sits in rectus position. The K wires are intact with no surrounding erythema or drainage.  No other areas of tenderness to bilateral lower extremities.  No other open lesions or pre-ulcerative lesions.  No pain with calf compression, swelling, warmth, erythema.   Assessment and Plan:  Status post left foot surgery  -Treatment options discussed including all alternatives, risks, and complications -Sutures removed. Antibiotic on the placed over the incision followed by dry sterile dressing. She can shower however did not get the toes wet. -Continue cam boot. Weightbearing as tolerated in boot. -Ice/elevation -Pain medication as needed. -Monitor for any clinical signs or symptoms of infection and DVT/PE and directed to call the office immediately should any occur or go to the ER. -she can return to work on 12/19 working 1/2 days for the first week before going back  full time, this pending her symptoms.  -Follow-up in 2 weeks or sooner if any problems arise. In the meantime, encouraged to call the office with any questions, concerns, change in symptoms.   [name], DPM

## 2015-11-26 ENCOUNTER — Ambulatory Visit (INDEPENDENT_AMBULATORY_CARE_PROVIDER_SITE_OTHER): Payer: 59

## 2015-11-26 ENCOUNTER — Telehealth: Payer: Self-pay | Admitting: *Deleted

## 2015-11-26 ENCOUNTER — Ambulatory Visit (INDEPENDENT_AMBULATORY_CARE_PROVIDER_SITE_OTHER): Payer: 59 | Admitting: Podiatry

## 2015-11-26 ENCOUNTER — Encounter: Payer: Self-pay | Admitting: Podiatry

## 2015-11-26 VITALS — Resp 16

## 2015-11-26 DIAGNOSIS — Z9889 Other specified postprocedural states: Secondary | ICD-10-CM

## 2015-11-26 DIAGNOSIS — M2042 Other hammer toe(s) (acquired), left foot: Secondary | ICD-10-CM

## 2015-11-26 MED ORDER — DICLOFENAC SODIUM 75 MG PO TBEC: 75.0000 mg | DELAYED_RELEASE_TABLET | Freq: Two times a day (BID) | ORAL | Status: DC

## 2015-11-26 MED ORDER — CEPHALEXIN 500 MG PO CAPS: 500.0000 mg | ORAL_CAPSULE | Freq: Three times a day (TID) | ORAL | Status: DC

## 2015-11-26 NOTE — Telephone Encounter (Signed)
Pt states had hammer toe surgery about 2 Fridays ago and is in extreme pain, so bad she almost went to the ER, and severe swelling.  Pt requested appt with Dr. Jacqualyn Posey, transferred to schedulers.

## 2015-11-28 NOTE — Progress Notes (Signed)
Patient ID: Christine [name], female   DOB: 1951/05/22, 64 y.o.   MRN: [id number]  Subjective: Christine [name] is a 64 y.o. is seen today in office s/p left third and fourth hammertoe correction.she states that over the weekend she had significant increase in pain. She does increase in pain medicine she was taking. She states of the toes with purple and swollen. Today she states it is significantly improved in symptoms and is not as swollen or is painful. She has continue with the cam boot. She denies any recent injury or trauma. No other complaints at this time.  Denies any systemic complaints such as fevers, chills, nausea, vomiting. No calf pain, chest pain, shortness of breath.   Objective: General: No acute distress, AAOx3; presents walking in CAM boot. DP/PT pulses palpable 2/4, CRT < 3 sec to all digits.  Protective sensation intact. Motor function intact.  Left foot: Incision is well coapted without any evidence of dehiscence and sutures are intact. There is no minimal surrounding erythema without anyascending cellulitis, fluctuance, crepitus, malodor, drainage/purulence. There is trace edema around the surgical site but does not appear to be greatly improved compared to last appointment. There is mild pain along the surgical site. The toe sits in rectus position. The K wires are intact with no surrounding erythema or drainage.  No other areas of tenderness to bilateral lower extremities.  No other open lesions or pre-ulcerative lesions.  No pain with calf compression, swelling, warmth, erythema.   Assessment and Plan:  Status post left foot surgery  -Treatment options discussed including all alternatives, risks, and complications -X-rays were obtained and reviewed with the patient.  -we'll start Keflex in case of infection given the slight redness however it appears to be more inflammation as opposed to infection. - She has been taking etodolac daily for anti-inflammatory. She says she  occasionally changes anti-inflammatory. Given her increase in pain we'll switch to diclofenac to see if this helps that she should not take both anti-inflammatories. Pain medication as needed. - Continue with CAM boot. -Monitor for any clinical signs or symptoms of infection and directed to call the office immediately should any occur or go to the ER. -Follow-up in 2 weeks or sooner if any problems arise. In the meantime, encouraged to call the office with any questions, concerns, change in symptoms.   [name], DPM

## 2015-12-03 ENCOUNTER — Encounter: Payer: 59 | Admitting: Podiatry

## 2015-12-09 ENCOUNTER — Ambulatory Visit (INDEPENDENT_AMBULATORY_CARE_PROVIDER_SITE_OTHER): Payer: PPO

## 2015-12-09 ENCOUNTER — Ambulatory Visit (INDEPENDENT_AMBULATORY_CARE_PROVIDER_SITE_OTHER): Payer: PPO | Admitting: Podiatry

## 2015-12-09 ENCOUNTER — Encounter: Payer: Self-pay | Admitting: Podiatry

## 2015-12-09 VITALS — BP 155/92 | HR 76 | Resp 14

## 2015-12-09 DIAGNOSIS — R52 Pain, unspecified: Secondary | ICD-10-CM

## 2015-12-09 DIAGNOSIS — M2042 Other hammer toe(s) (acquired), left foot: Secondary | ICD-10-CM

## 2015-12-09 DIAGNOSIS — Z9889 Other specified postprocedural states: Secondary | ICD-10-CM

## 2015-12-09 NOTE — Progress Notes (Signed)
Patient ID: Christine [name], female   DOB: 1950/12/13, 65 y.o.   MRN: [id number]  Subjective: Christine [name] is a 65 y.o. is seen today in office s/p left third and fourth hammertoe correction. She presents today for pin removal. Has continued in the CAM boot. She does come Weinstein pain to both of her toes. She denies any recent injury or trauma. No other complaints at this time.  Denies any systemic complaints such as fevers, chills, nausea, vomiting. No calf pain, chest pain, shortness of breath.   Objective: General: No acute distress, AAOx3; presents walking in CAM boot. DP/PT pulses palpable 2/4, CRT < 3 sec to all digits.  Protective sensation intact. Motor function intact.  Left foot: Incision is well coapted without any evidence of dehiscence and a scar has formed. There is no minimal surrounding erythema without any ascending cellulitis, fluctuance, crepitus, malodor, drainage/purulence. There is trace edema around the surgical site but does not appear to be greatly improved compared to last appointment. The swelling is localized to the toes. There is mild pain along the surgical site. The toe sits in rectus position. The K wires are intact with no surrounding erythema or drainage.  No other areas of tenderness to bilateral lower extremities.  No other open lesions or pre-ulcerative lesions.  No pain with calf compression, swelling, warmth, erythema.   Assessment and Plan:  Status post left foot surgery, doing well  -Treatment options discussed including all alternatives, risks, and complications -X-rays were obtained and reviewed with the patient.  -The K wires were cleaned and then removed in total. In about equinus placed followed by a dressing. She can start to shower tomorrow as long as the pin sites are closed. -She can return to regular shoe as tolerated. -I showed her how to tape the toes to help with swelling -She has not been taking any pain medicine is not any refill this  time. -Monitor for any clinical signs or symptoms of infection and directed to call the office immediately should any occur or go to the ER. -Follow-up in 3-4 weeks or sooner if any problems arise. In the meantime, encouraged to call the office with any questions, concerns, change in symptoms.   [name], DPM

## 2015-12-30 ENCOUNTER — Encounter: Payer: Self-pay | Admitting: Podiatry

## 2015-12-30 ENCOUNTER — Ambulatory Visit (INDEPENDENT_AMBULATORY_CARE_PROVIDER_SITE_OTHER): Payer: PPO

## 2015-12-30 ENCOUNTER — Ambulatory Visit (INDEPENDENT_AMBULATORY_CARE_PROVIDER_SITE_OTHER): Payer: PPO | Admitting: Podiatry

## 2015-12-30 VITALS — BP 135/76 | HR 75 | Resp 16

## 2015-12-30 DIAGNOSIS — M79672 Pain in left foot: Secondary | ICD-10-CM

## 2015-12-30 DIAGNOSIS — M2042 Other hammer toe(s) (acquired), left foot: Secondary | ICD-10-CM

## 2015-12-30 DIAGNOSIS — Z9889 Other specified postprocedural states: Secondary | ICD-10-CM

## 2015-12-30 NOTE — Progress Notes (Signed)
Patient ID: Christine [name], female   DOB: Dec 25, 1950, 65 y.o.   MRN: [id number]  Subjective: Christine [name] is a 65 y.o. is seen today in office s/p left third and fourth hammertoe correction. Successively she has transition back into a regular shoe. She does take the toes to help with swelling all the swelling does continue although decreased. She's not having any pain with the area. Denies any systemic complaints such as fevers, chills, nausea, vomiting. No calf pain, chest pain, shortness of breath.   Objective: General: No acute distress, AAOx3; presents walking in CAM boot. DP/PT pulses palpable 2/4, CRT < 3 sec to all digits.  Protective sensation intact. Motor function intact.  Left foot: Incision is well coapted without any evidence of dehiscence and a scar has formed over the surgical sites. There is no surrounding erythema and no ascending cellulitis, fluctuance, crepitus, malodor, drainage/purulence. There is trace edema around the surgical site but does not appear to be greatly improved compared to last appointment. The swelling is localized to the toes. There is no pain along the surgical site. The fourth toe Desitin adductovarus position although third toe is rectus.  ve lesions.  No pain with calf compression, swelling, warmth, erythema.   Assessment and Plan:  Status post left foot surgery, doing well  -Treatment options discussed including all alternatives, risks, and complications -X-rays were obtained and reviewed with the patient.  -The fourth toe is sitting in a contracted position. Darco splint with toe positioner placed to help [place] of the toe. -Continue with regular shoe gear. -Ice elevation. Continue taping the toes. -Monitor for any clinical signs or symptoms of infection and directed to call the office immediately should any occur or go to the ER. -Follow-up in 6 weeks or sooner if any problems arise. In the meantime, encouraged to call the office with any  questions, concerns, change in symptoms.   [name], DPM

## 2016-02-10 ENCOUNTER — Ambulatory Visit (INDEPENDENT_AMBULATORY_CARE_PROVIDER_SITE_OTHER): Payer: PPO | Admitting: Podiatry

## 2016-02-10 ENCOUNTER — Encounter: Payer: Self-pay | Admitting: Podiatry

## 2016-02-10 ENCOUNTER — Ambulatory Visit (INDEPENDENT_AMBULATORY_CARE_PROVIDER_SITE_OTHER): Payer: PPO

## 2016-02-10 VITALS — BP 143/87 | HR 93 | Resp 18

## 2016-02-10 DIAGNOSIS — M2042 Other hammer toe(s) (acquired), left foot: Secondary | ICD-10-CM

## 2016-02-10 DIAGNOSIS — Z9889 Other specified postprocedural states: Secondary | ICD-10-CM | POA: Diagnosis not present

## 2016-02-10 NOTE — Progress Notes (Signed)
Patient ID: Christine [name], female   DOB: Sep 07, 1951, 65 y.o.   MRN: [id number]  Subjective: 65 year old female presents the office today for concerns of her left fourth toe becoming bothersome. She states that she has a constant pain to her fourth toe which tries to walk. Her third toe since the surgery for the hammertoe corrections done well. She describes left fourth toe sustaining sensation. She has tried bracing the toe and this hurts even more. She has difficulty on a daily basis. Denies any systemic complaints such as fevers, chills, nausea, vomiting. No acute changes since last appointment, and no other complaints at this time.   Objective: AAO x3, NAD DP/PT pulses palpable bilaterally, CRT less than 3 seconds Protective sensation intact with Simms Weinstein monofilament, vibratory sensation intact, Achilles tendon reflex intact Incisional line in the left third and fourth toes well healed. The third toes in rectus position. The fourth toe does sit in adductovarus position at the PIPJ. There is mild tenderness and swelling overlying this toe. No areas of pinpoint bony tenderness or pain with vibratory sensation. MMT 5/5, ROM WNL. No edema, erythema, increase in warmth to bilateral lower extremities.  No open lesions or pre-ulcerative lesions.  No pain with calf compression, swelling, warmth, erythema  Assessment: Recurrence of hammertoe left fourth toe  Plan: -All treatment options discussed with the patient including all alternatives, risks, complications.  -X-rays were obtained and reviewed with the patient. 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 Given her continued symptoms x-ray changes discussed with her hammertoe revision. Should proceed with this. Discussed with her hammertoe repair with K wire fixation. -The incision placement as well as the postoperative course was discussed with the patient. I discussed risks of  the surgery which include, but not limited to, infection, bleeding, pain, swelling, need for further surgery, delayed or nonhealing, painful or ugly scar, numbness or sensation changes, over/under correction, recurrence, transfer lesions, further deformity, hardware failure, DVT/PE, loss of toe/foot. Patient understands these risks and wishes to proceed with surgery. The surgical consent was reviewed with the patient all 3 pages were signed. No promises or guarantees were given to the outcome of the procedure. All questions were answered to the best of my ability. Before the surgery the patient was encouraged to call the office if there is any further questions. The surgery will be performed at the [facility] on an outpatient basis. -Patient encouraged to call the office with any questions, concerns, change in symptoms.   [name], DPM

## 2016-02-10 NOTE — Patient Instructions (Signed)

## 2016-02-17 ENCOUNTER — Encounter: Payer: Self-pay | Admitting: Podiatry

## 2016-02-17 DIAGNOSIS — M2042 Other hammer toe(s) (acquired), left foot: Secondary | ICD-10-CM | POA: Diagnosis not present

## 2016-02-17 DIAGNOSIS — Z01818 Encounter for other preprocedural examination: Secondary | ICD-10-CM | POA: Diagnosis not present

## 2016-02-18 ENCOUNTER — Telehealth: Payer: Self-pay | Admitting: *Deleted

## 2016-02-18 ENCOUNTER — Encounter: Payer: Self-pay | Admitting: Podiatry

## 2016-02-18 NOTE — Progress Notes (Signed)
Patient ID: Christine [name], female   DOB: [birth year], 65 y.o.   MRN: [id number]  Received a call from from Dr. Marcille Blanco in regards to [name]. He did not want to wait for me to come out of the room and therefor they hung up. I called Delydia, our surgery scheduler, to see who Dr. Marcille Blanco was. He is apparently from [employer] and needs to do a peer-to-peer. I immediately called the number back and someone answered and then hung up the phone. I called back and it went to voicemail. I will attempt to call tomorrow.

## 2016-02-18 NOTE — Telephone Encounter (Signed)
"  I'm calling because I received a request for Christine [name].  I have to send this to a medical doctor for review.  I need availability of Dr. [name] in case a peer to peer is needed.  Please give me a call."

## 2016-02-18 NOTE — Telephone Encounter (Signed)
This is Daisi Kentner I spoke to you this morning regarding Natasha Bence.  I think a peer to peer doctor may have called Dr. Jacqualyn Posey regarding this patient.  Do you have a doctor named Dr. Marcille Blanco?  "Yes, he's one of the doctors from [place] we use for peer to peer."  Can I get a phone number for him, he didn't leave a call back number.  "The number to call is [phone]."

## 2016-02-18 NOTE — Telephone Encounter (Signed)
PER DR Jacqualyn Posey, CALLED PATIENT TO SEE HOW PATIENT WAS DOING AFTER SURGERY-VOICE MAIL CAME ON AND I LEFT A MESSAGE TO CALL ME BACK IN [place]. Christine [name]

## 2016-02-19 ENCOUNTER — Encounter: Payer: Self-pay | Admitting: Podiatry

## 2016-02-19 ENCOUNTER — Telehealth: Payer: Self-pay | Admitting: *Deleted

## 2016-02-19 NOTE — Progress Notes (Signed)
Surgery was performed at [facility].  Prescription was given for Demerol 50 mg quantity 30, Phenergan 125 mg quantity 30, and Keflex 500 mg quantity 7.

## 2016-02-19 NOTE — Telephone Encounter (Signed)
I'm calling to see how you are doing following your surgery.  "I feel absolutely fine.  I had some pain yesterday but it has eased off today.  Actually I skipped a dose of the pain medication to see how I would do without it.  I'm keeping it elevated.  I was pleased with the surgical center, everything went well."  Continue to elevate and stay off foot as much as possible, drink plenty of fluids.  Check you calf for these symptoms; redness, fever, swelling, pain or redness.  If you notice any of these symptoms give [place] a call.  You have an appointment with [place] on 02/28/2016 at 11:15am.  "Okay, thank you."

## 2016-02-19 NOTE — Progress Notes (Signed)
  I called [facility] back to speak with Dr. Marcille Blanco. I was told by the lady who answered the phone that the case is not open and it as been sent back to [facility] and there was nothing I needed to do at this time. Informed Delydia.

## 2016-02-28 ENCOUNTER — Ambulatory Visit (INDEPENDENT_AMBULATORY_CARE_PROVIDER_SITE_OTHER): Payer: PPO

## 2016-02-28 ENCOUNTER — Ambulatory Visit (INDEPENDENT_AMBULATORY_CARE_PROVIDER_SITE_OTHER): Payer: PPO | Admitting: Podiatry

## 2016-02-28 ENCOUNTER — Encounter: Payer: Self-pay | Admitting: Podiatry

## 2016-02-28 VITALS — BP 136/92 | HR 82 | Resp 18

## 2016-02-28 DIAGNOSIS — M2042 Other hammer toe(s) (acquired), left foot: Secondary | ICD-10-CM

## 2016-02-28 DIAGNOSIS — Z9889 Other specified postprocedural states: Secondary | ICD-10-CM

## 2016-02-28 NOTE — Progress Notes (Signed)
Patient ID: Christine [name], female   DOB: Jul 02, 1951, 65 y.o.   MRN: [id number]  Subjective: 65 year old [place] the [place] visit [place]1 status post left fourth digit PIPJ V revisional hammertoe repair. She said that she is doing much better the surgeon's digit at the last 1. There is no pain to the area and she discontinued pain medication after 1 day. She has not felt any swelling to the toe. She has continued the cam boot. Denies any systemic complaints such as fevers, chills, nausea, vomiting. No acute changes since last appointment, and no other complaints at this time.   Objective: AAO x3, NAD DP/PT pulses palpable bilaterally, CRT less than 3 seconds On the left foot there is incision the dorsal aspect of the fourth toe. Sutures are intact. There is no evidence of dehiscence. There is no swelling erythema, ascending cellulitis, fluctuance, crepitus, malodor, drainage or pus. There is no tenderness to palpation of the surgical site. There is mild swelling. There is no other areas of tenderness bilateral lower extremities. K wire intact to the fourth toe without any drainage or redness. No edema, erythema, increase in warmth to bilateral lower extremities.  No open lesions or pre-ulcerative lesions.  No pain with calf compression, swelling, warmth, erythema  Assessment: Postop visit #1 status post left fourth digit revisional hammertoe  Plan: -All treatment options discussed with the patient including all alternatives, risks, complications.  -X-rays were obtained and reviewed with the patient. K wire intact. Toe sits in rectus position. No evidence of acute fracture or stress fracture. -Antibiotic ointment was applied followed by dry sterile dressing. Keep dressing clean, dry, intact. -Continue ice and elevation -Pain medication as needed -Can switch to short cam boot -Monitor for signs or symptoms of infection and DVT/PE. Directed to call the office or go directly to the  emergency room. -Follow-up in 1 week for suture removal or sooner if any problems arise. In the meantime, encouraged to call the office with any questions, concerns, change in symptoms.   [name], DPM

## 2016-03-02 ENCOUNTER — Telehealth: Payer: Self-pay | Admitting: *Deleted

## 2016-03-02 NOTE — Telephone Encounter (Signed)
Pt states she had re-do surgery with Dr. Jacqualyn Posey and she pulled the pin out while taking off the gauze.  Dr. Jacqualyn Posey states have pt come in to see Dr. Cannon Kettle tomorrow to have the toe splinted.  I informed the pt and told her to cover the tip of the surgery toe with an antibiotic bandaid, and stay in the surgical shoe, and we would call for an appt tomorrow.  Pt states understanding.

## 2016-03-03 ENCOUNTER — Encounter: Payer: Self-pay | Admitting: Sports Medicine

## 2016-03-03 ENCOUNTER — Ambulatory Visit (INDEPENDENT_AMBULATORY_CARE_PROVIDER_SITE_OTHER): Payer: PPO | Admitting: Sports Medicine

## 2016-03-03 VITALS — BP 129/88 | HR 90 | Resp 16

## 2016-03-03 DIAGNOSIS — M79672 Pain in left foot: Secondary | ICD-10-CM

## 2016-03-03 DIAGNOSIS — Z9889 Other specified postprocedural states: Secondary | ICD-10-CM

## 2016-03-03 NOTE — Progress Notes (Signed)
Patient ID: Christine [name], female   DOB: Jul 24, 1951, 65 y.o.   MRN: [id number] Subjective: Christine [name] is a 65 y.o. female patient seen today in office for POV #2 (DOS 02-17-16), S/P revisional left 4th hammertoe repair. Patient states that yesterday the K-wire/pin came out as she was adjusting her bandage; denies pain at surgical site, denies calf pain, denies headache, chest pain, shortness of breath, nausea, vomiting, fever, or chills. No other issues noted.   Patient Active Problem List   Diagnosis Date Noted  . Post-operative state 12/30/2015  . Hammertoe 11/11/2015  . 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 OA (osteoarthritis) of hip 01/23/2015  . Cough 10/07/2014    Current Outpatient Prescriptions on File Prior to Visit  Medication Sig Dispense Refill  . DULoxetine (CYMBALTA) 60 MG capsule Take 60 mg by mouth every morning.      No current facility-administered medications on file prior to visit.    Allergies  Allergen Reactions  . Dilaudid [Hydromorphone Hcl] Itching  . Other Nausea And Vomiting    "most pain medications cause really bad nausea."   . Sulfa Antibiotics Rash  . Codeine Nausea Only    Objective: There were no vitals filed for this visit.  General: No acute distress, AAOx3  Left foot: Sutures intact with no gapping or dehiscence at surgical site, pin site at tip of 4th toe with granular tissue present, mild swelling to left 4th toe, no erythema, no warmth, no drainage, no signs of infection noted, Capillary fill time <3 seconds in all digits, gross sensation present via light touch to left foot. No pain or crepitation with range of motion left foot with good alignment maintained at left 4th toe.  No pain with calf compression.   Assessment and Plan:  Problem List Items Addressed This Visit      Other   Post-operative state - Primary    Other Visit Diagnoses    Left foot pain           -Patient seen and evaluated -Xrays not available at  this visit -Applied topcial antibiotic and toe splint to 4th toe on left secured with coban in a buddy fashion to surgical site left foot -Advised patient to make sure to keep dressings clean, dry, and intact to left surgical site and closely monitor the toe -Advised patient to continue with CAM boot to left foot  -Advised patient to limit activity to necessity  -Advised patient to ice and elevate as necessary  -Cont with Pain and PRN meds as needed -Will plan for suture removal and xrays at next office visit. In the meantime, patient to call office if any issues or problems arise.   Landis Martins, DPM

## 2016-03-05 DIAGNOSIS — Z96642 Presence of left artificial hip joint: Secondary | ICD-10-CM | POA: Diagnosis not present

## 2016-03-05 DIAGNOSIS — Z471 Aftercare following joint replacement surgery: Secondary | ICD-10-CM | POA: Diagnosis not present

## 2016-03-06 ENCOUNTER — Ambulatory Visit (INDEPENDENT_AMBULATORY_CARE_PROVIDER_SITE_OTHER): Payer: PPO | Admitting: Podiatry

## 2016-03-06 ENCOUNTER — Ambulatory Visit: Payer: Self-pay

## 2016-03-06 ENCOUNTER — Encounter: Payer: Self-pay | Admitting: Podiatry

## 2016-03-06 DIAGNOSIS — M2042 Other hammer toe(s) (acquired), left foot: Secondary | ICD-10-CM

## 2016-03-06 DIAGNOSIS — T148 Other injury of unspecified body region: Secondary | ICD-10-CM

## 2016-03-06 DIAGNOSIS — W57XXXA Bitten or stung by nonvenomous insect and other nonvenomous arthropods, initial encounter: Secondary | ICD-10-CM

## 2016-03-06 DIAGNOSIS — Z9889 Other specified postprocedural states: Secondary | ICD-10-CM

## 2016-03-06 MED ORDER — DOXYCYCLINE HYCLATE 100 MG PO TABS: 100.0000 mg | ORAL_TABLET | Freq: Two times a day (BID) | ORAL | Status: DC

## 2016-03-10 DIAGNOSIS — W57XXXA Bitten or stung by nonvenomous insect and other nonvenomous arthropods, initial encounter: Secondary | ICD-10-CM | POA: Insufficient documentation

## 2016-03-10 NOTE — Progress Notes (Signed)
Patient ID: Christine [name], female   DOB: [birth year], 65 y.o.   MRN: [id number]  Subjective: 65 year old female presents to the office status post revisional left fourth digit hammertoe repair. Early in the week K wire did fall out and she did come to see Dr. Cannon Kettle on the toe was splinted. She still the toe was splinted she is having more pain. Since removing the splint she states that her toe "feels great". She states that it feels much better now than he did prior to the initial surgery and after the first surgery. She is not taking pain medicine. She has continue cam boot.  She also did notice that she had a tick on her left ankle that she removed. Since then she has had a small red bump over the area which does itch intermittently.  Denies any systemic complaints such as fevers, chills, nausea, vomiting. No acute changes since last appointment, and no other complaints at this time.   Objective: AAO x3, NAD DP/PT pulses palpable bilaterally, CRT less than 3 seconds On the left foot there is incision the dorsal aspect of the fourth toe. Sutures are intact. K wires not intact. The toe does sit in a more rectus position although it is slightly medially deviated at the PIPJ. There is no pain to the toe there is minimal swelling and there is no erythema or increase in warmth. Sutures are intact the toe without any evidence of dehiscence and the incision is well coapted. On the anterior aspect of the left ankle is a small annular erythematous area from with a tick was attached. There is no "bull's-eye" type rash.  No edema, erythema, increase in warmth to bilateral lower extremities.  No open lesions or pre-ulcerative lesions.  No pain with calf compression, swelling, warmth, erythema  Assessment: Postop visit s/p left fourth revisional hammertoe and which K wire has been removed; tick bite  Plan: -All treatment options discussed with the patient including all alternatives, risks, complications.   -X-rays were obtained and reviewed with the patient. The toe is somewhat medially deviated at the PIPJ. However clinically the toe dressed in a more rectus position. I had a long discussion the patient regards to revisional surgery again in order to replace the K wire and/or internal fixation however she states that she is doing well she's not having any pain and should to hold off any further surgical time as she is doing well. Can wear surgical shoe. Ice and elevation. The sutures are removed today without complications. Pain medication as needed. -For the tick bite prescribed doxycycline as she does have a history of previous tick illness. Also follow-up with primary care physician. -Monitor for signs or symptoms of infection and DVT/PE. Directed to call the office or go directly to the emergency room. -Follow-up in 2 weeks or sooner if any problems arise. In the meantime, encouraged to call the office with any questions, concerns, change in symptoms.   [name], DPM

## 2016-03-12 NOTE — Telephone Encounter (Signed)
Surgery was approved.

## 2016-03-20 ENCOUNTER — Ambulatory Visit (INDEPENDENT_AMBULATORY_CARE_PROVIDER_SITE_OTHER): Payer: PPO | Admitting: Podiatry

## 2016-03-20 ENCOUNTER — Encounter: Payer: Self-pay | Admitting: Podiatry

## 2016-03-20 ENCOUNTER — Ambulatory Visit (INDEPENDENT_AMBULATORY_CARE_PROVIDER_SITE_OTHER): Payer: PPO

## 2016-03-20 VITALS — BP 148/94 | HR 69 | Resp 12

## 2016-03-20 DIAGNOSIS — M79672 Pain in left foot: Secondary | ICD-10-CM

## 2016-03-20 DIAGNOSIS — M2042 Other hammer toe(s) (acquired), left foot: Secondary | ICD-10-CM | POA: Diagnosis not present

## 2016-03-20 DIAGNOSIS — M2011 Hallux valgus (acquired), right foot: Secondary | ICD-10-CM | POA: Diagnosis not present

## 2016-03-20 DIAGNOSIS — M2041 Other hammer toe(s) (acquired), right foot: Secondary | ICD-10-CM

## 2016-03-20 MED ORDER — DESOXIMETASONE 0.25 % EX CREA: 1.0000 "application " | TOPICAL_CREAM | Freq: Two times a day (BID) | CUTANEOUS | Status: DC

## 2016-03-23 NOTE — Progress Notes (Signed)
Patient ID: Christine [name], female   DOB: October 13, 1951, 65 y.o.   MRN: [id number]  Subjective: 65 year old female presents the office they for follow up vibration status post left fourth digit revisional hammertoe repair. She states that the toe [facility] better position however is still swollen. She has some occasional discomfort to the area. She is also concerned about her right second toe smooth the area as well as a recurrent bunion. No recent injury or trauma. No swelling or redness otherwise. Denies any systemic complaints such as fevers, chills, nausea, vomiting. No acute changes since last appointment, and no other complaints at this time.   Objective: AAO x3, NAD DP/PT pulses palpable bilaterally, CRT less than 3 seconds Protective sensation intact with Simms Weinstein monofilament Right third toe is well healed in a rectus position. The fourth toe is in a more rectus position however a slight adductovarus. There is moderate edema to the toe. No erythema or increase in warmth. There is mild tenderness palpation on the fourth toe. On the right foot the second toe does sit in extended position as well as a recurrent bunion is present. There is slight irritation on the medial aspect of the first met her 7 right foot. There is noted areas of tenderness bilaterally. No areas of pinpoint bony tenderness or pain with vibratory sensation. MMT 5/5, ROM WNL. No edema, erythema, increase in warmth to bilateral lower extremities.  No open lesions or pre-ulcerative lesions.  No pain with calf compression, swelling, warmth, erythema  Assessment: Status post left fourth digit revisional hammertoe in which the wire came out early; right foot bunion and hammertoe  Plan: -All treatment options discussed with the patient including all alternatives, risks, complications.  -X-rays were obtained and reviewed. -At this time again had a discussion with the patient in regards to revisional surgery again to the wire  been removed early. She wishes to hold off on surgery. Discussed in the future that the symptoms are to continue once the swelling decreases she may need to have further surgery patient understands this. Continue to splint the toe. Supportive shoe gear. Also discussed treatment options of the right foot. Discussed shoe gear modifications. She may need to have surgery in the future if symptoms continue. Follow-up in 2 weeks or sooner if any issues are to arise. -Patient encouraged to call the office with any questions, concerns, change in symptoms.   [name], DPM

## 2016-04-01 DIAGNOSIS — N951 Menopausal and female climacteric states: Secondary | ICD-10-CM | POA: Diagnosis not present

## 2016-04-17 ENCOUNTER — Encounter: Payer: PPO | Admitting: Podiatry

## 2016-04-20 ENCOUNTER — Ambulatory Visit (INDEPENDENT_AMBULATORY_CARE_PROVIDER_SITE_OTHER): Payer: PPO | Admitting: Podiatry

## 2016-04-20 ENCOUNTER — Ambulatory Visit (INDEPENDENT_AMBULATORY_CARE_PROVIDER_SITE_OTHER): Payer: PPO

## 2016-04-20 ENCOUNTER — Encounter: Payer: Self-pay | Admitting: Podiatry

## 2016-04-20 VITALS — BP 140/86 | HR 85 | Resp 18

## 2016-04-20 DIAGNOSIS — M2042 Other hammer toe(s) (acquired), left foot: Secondary | ICD-10-CM | POA: Diagnosis not present

## 2016-04-20 NOTE — Progress Notes (Signed)
Patient ID: Christine [name], female   DOB: Nov 25, 1951, 65 y.o.   MRN: [id number]  Subjective: 65 year old female presents the office they for follow up evaluation status post left fourth digit revisional hammertoe repair. She states that she still has swelling to the toe as well as some discomfort. The pain that she was having prior to surgery has resolved but the pain now is put around which point the PIPJ. She also is starting to get pain to the 2nd digit hammertoe. She states that the toe rubs in the shoes causing irritation. Denies any systemic complaints such as fevers, chills, nausea, vomiting. No acute changes since last appointment, and no other complaints at this time.   Objective: AAO x3, NAD DP/PT pulses palpable bilaterally, CRT less than 3 seconds Protective sensation intact with Simms Weinstein monofilament Right third toe is well healed in a rectus position. The fourth toe is in a more rectus position however a slight adductovarus. There is continued edema to the toe. No erythema or increase in warmth. There is mild tenderness palpation on the fourth toe at the PIPJ. There is semi-rigid hammertoe contracture of the left 2nd digit with slight irritation to the dorsal PIPJ to the distal aspect of the digit. No open lesions or pre-ulcerative lesions.  No pain with calf compression, swelling, warmth, erythema  Assessment: Status post left fourth digit revisional hammertoe in which the wire came out early; 2nd digit hammertoe on the left  Plan: -All treatment options discussed with the patient including all alternatives, risks, complications.  -X-rays were obtained and reviewed. Hammertoe present the second toe. The fourth toe does sit in a more rectus alignment. No evidence of acute fracture or stress fracture. -Offloading pads dispensed. -Today I again had a long discussion the patient regards revisional surgery of the fourth toe. Also discuss hammertoe correction of the second digit.  She wishes to hold off on surgery until the fall. She'll consider doing this closer to August or September. If symptoms continue.   [name], DPM

## 2016-04-30 DIAGNOSIS — M47817 Spondylosis without myelopathy or radiculopathy, lumbosacral region: Secondary | ICD-10-CM | POA: Diagnosis not present

## 2016-04-30 DIAGNOSIS — Z6829 Body mass index (BMI) 29.0-29.9, adult: Secondary | ICD-10-CM | POA: Diagnosis not present

## 2016-04-30 DIAGNOSIS — I10 Essential (primary) hypertension: Secondary | ICD-10-CM | POA: Diagnosis not present

## 2016-09-22 DIAGNOSIS — Z1231 Encounter for screening mammogram for malignant neoplasm of breast: Secondary | ICD-10-CM | POA: Diagnosis not present

## 2016-09-22 DIAGNOSIS — Z6828 Body mass index (BMI) 28.0-28.9, adult: Secondary | ICD-10-CM | POA: Diagnosis not present

## 2016-09-22 DIAGNOSIS — Z1272 Encounter for screening for malignant neoplasm of vagina: Secondary | ICD-10-CM | POA: Diagnosis not present

## 2016-09-22 DIAGNOSIS — R351 Nocturia: Secondary | ICD-10-CM | POA: Diagnosis not present

## 2016-09-22 DIAGNOSIS — Z124 Encounter for screening for malignant neoplasm of cervix: Secondary | ICD-10-CM | POA: Diagnosis not present

## 2016-09-22 DIAGNOSIS — N390 Urinary tract infection, site not specified: Secondary | ICD-10-CM | POA: Diagnosis not present

## 2016-12-02 DIAGNOSIS — I10 Essential (primary) hypertension: Secondary | ICD-10-CM | POA: Diagnosis not present

## 2016-12-02 DIAGNOSIS — R42 Dizziness and giddiness: Secondary | ICD-10-CM | POA: Diagnosis not present

## 2016-12-02 DIAGNOSIS — Z23 Encounter for immunization: Secondary | ICD-10-CM | POA: Diagnosis not present

## 2016-12-02 DIAGNOSIS — H538 Other visual disturbances: Secondary | ICD-10-CM | POA: Diagnosis not present

## 2016-12-02 DIAGNOSIS — R351 Nocturia: Secondary | ICD-10-CM | POA: Diagnosis not present

## 2016-12-02 DIAGNOSIS — R5383 Other fatigue: Secondary | ICD-10-CM | POA: Diagnosis not present

## 2016-12-18 ENCOUNTER — Ambulatory Visit (INDEPENDENT_AMBULATORY_CARE_PROVIDER_SITE_OTHER): Payer: PPO | Admitting: Podiatry

## 2016-12-18 ENCOUNTER — Encounter: Payer: Self-pay | Admitting: Podiatry

## 2016-12-18 ENCOUNTER — Ambulatory Visit (INDEPENDENT_AMBULATORY_CARE_PROVIDER_SITE_OTHER): Payer: PPO

## 2016-12-18 VITALS — BP 146/94 | HR 74

## 2016-12-18 DIAGNOSIS — L601 Onycholysis: Secondary | ICD-10-CM | POA: Diagnosis not present

## 2016-12-18 DIAGNOSIS — M2042 Other hammer toe(s) (acquired), left foot: Secondary | ICD-10-CM

## 2016-12-18 DIAGNOSIS — M79672 Pain in left foot: Secondary | ICD-10-CM

## 2016-12-18 NOTE — Progress Notes (Signed)
Subjective: 66 year old female presents the office today for concerns of painful hammertoes to her left second, third, fourth digit. She states that she's had pain on a daily basis to these toes. A portion she's had surgery for the third fourth The third toe is starting to contract the tip of the toe causing pressure was shoes. Her second toe is also started become more hammered. She has tried shoe gear changes, padding, offloading without any significant improvement she is requesting surgical intervention today. Also she states her left big toenail she hit in the nails about a fall off. Denies any pain to the area and denies aness or drainage. Denies any systemic complaints such as fevers, chills, nausea, vomiting. No acute changes since last appointment, and no other complaints at this time.   Objective: AAO x3, NAD DP/PT pulses palpable bilaterally, CRT less than 3 seconds Semirigid hammertoe contracture left second toe. DIP contracture of the left third toe. There is recurrence of the left fourth toe hammertoe with PIPJ. There is tenderness palpation on the dorsal aspects of these joints and there is slight irritation from shoe gear.There is slight subungual hematoma to the left second digit toenail from with the toe hits in her shoes. Her left hallux toenails dystrophic, discolored and is loose and the underlg nail bed adhered along the medial border. There is no surrounding erythema, edema.  There is no other areas of tenderness bilaterally No open lesions or pre-ulcerative lesions.  No pain with calf compression, swelling, warmth, erythema  Assessment: Left hallux onycholysis; hammertoe left 2-4th digits   Plan: -All treatment options discussed with the patient including all alternatives, risks, complications.  -X-rays were obtained and reviewed with the patient.  -Hammertoes are present. No evidence of acute fracture. I discussed both conservative and surgical treatment options the patient.  This time she is exhausted conservative treatment she is a question surgical intervention due to the pain. -Discussed with her left hallux toenail removal if the nail still present day of surgery as well as hammertoe repair of the second, third, fourth digit with internal fixation due to reoccurrence. -The incision placement as well as the postoperative course was discussed with the patient. I discussed risks of the surgery which include, but not limited to, infection, bleeding, pain, swelling, need for further surgery, delayed or nonhealing, painful or ugly scar, numbness or sensation changes, over/under correction, recurrence, transfer lesions, further deformity, hardware failure, DVT/PE, loss of toe/foot, hardware problems. Patient understands these risks and wishes to proceed with surgery. The surgical consent was reviewed with the patient all 3 pages were signed. No promises or guarantees were given to the outcome of the procedure. All questions were answered to the best of my ability. Before the surgery the patient was encouraged to call the office if there is any further questions. The surgery will be performed at the [facility] on an outpatient basis.  [name], DPM -Patient encouraged to call the office with any questions, concerns, change in symptoms.

## 2016-12-18 NOTE — Patient Instructions (Signed)

## 2016-12-30 ENCOUNTER — Encounter: Payer: Self-pay | Admitting: Podiatry

## 2016-12-30 DIAGNOSIS — D509 Iron deficiency anemia, unspecified: Secondary | ICD-10-CM | POA: Diagnosis not present

## 2016-12-30 DIAGNOSIS — M2042 Other hammer toe(s) (acquired), left foot: Secondary | ICD-10-CM | POA: Diagnosis not present

## 2016-12-31 ENCOUNTER — Telehealth: Payer: Self-pay | Admitting: *Deleted

## 2016-12-31 NOTE — Telephone Encounter (Signed)
Pt states the keflex is making her itch all over, does Dr. Jacqualyn Posey want to change her medication. Dr. Jacqualyn Posey states pt received an antibiotic prophylactic, so she can stop the Keflex. I informed pt and she states understanding. Pt states she had a rough night and morning but is better now. I told pt to remain in the boot at all times and not be up on the surgery foot more than 15 minutes per hour.

## 2017-01-04 ENCOUNTER — Encounter: Payer: Self-pay | Admitting: Podiatry

## 2017-01-04 ENCOUNTER — Ambulatory Visit (INDEPENDENT_AMBULATORY_CARE_PROVIDER_SITE_OTHER): Payer: PPO

## 2017-01-04 ENCOUNTER — Ambulatory Visit (INDEPENDENT_AMBULATORY_CARE_PROVIDER_SITE_OTHER): Payer: PPO | Admitting: Podiatry

## 2017-01-04 VITALS — BP 120/88 | HR 94 | Temp 99.1°F

## 2017-01-04 DIAGNOSIS — M2011 Hallux valgus (acquired), right foot: Secondary | ICD-10-CM

## 2017-01-04 DIAGNOSIS — M2042 Other hammer toe(s) (acquired), left foot: Secondary | ICD-10-CM | POA: Diagnosis not present

## 2017-01-04 DIAGNOSIS — Z9889 Other specified postprocedural states: Secondary | ICD-10-CM

## 2017-01-04 MED ORDER — CLINDAMYCIN HCL 300 MG PO CAPS: 300.0000 mg | ORAL_CAPSULE | Freq: Three times a day (TID) | ORAL | 0 refills | Status: DC

## 2017-01-05 NOTE — Progress Notes (Signed)
DOS 01.31.2018 Left Foot Hammertoe Repair of 2,3,4 with Screw Fixation. Left Big Toenail Removal.

## 2017-01-07 NOTE — Progress Notes (Signed)
Subjective: Christine [name] is a 66 y.o. is seen today in office s/p hammertoe repair of digits 2-4 on the left foot preformed on 12-30-16. They state their pain is improved. She had pain for the first 2 days but it has much improved. Denies any systemic complaints such as fevers, chills, nausea, vomiting. No calf pain, chest pain, shortness of breath.   Objective: General: No acute distress, AAOx3  DP/PT pulses palpable 2/4, CRT < 3 sec to all digits.  Protective sensation intact. Motor function intact.  Left foot: Incision is well coapted without any evidence of dehiscence and sutures are intact. There is minimal erythema likely from inflammation as opposed to infection however there is no ascending cellulitis, fluctuance, crepitus, malodor, drainage/purulence. There is mild edema around the surgical site. There is mild pain along the surgical site. The toes are in a rectus position of the second toenail distally is slightly rotated in a medial direction. No other areas of tenderness to bilateral lower extremities.  No other open lesions or pre-ulcerative lesions.  No pain with calf compression, swelling, warmth, erythema.   Assessment and Plan:  Status post left hammer toe repair, doing well with no complications   -Treatment options discussed including all alternatives, risks, and complications -X-rays were obtained and reviewed with the patient. Hardware intact.  -Discussed with her the rotation of the second toe. She states "I don't care as long as the toes are straight".  -Continue cam boot. -Ice/elevation -Pain medication as needed. -Monitor for any clinical signs or symptoms of infection and DVT/PE and directed to call the office immediately should any occur or go to the ER. -Follow-up in 1 week for POSSIBLE suture removal or sooner if any problems arise. In the meantime, encouraged to call the office with any questions, concerns, change in symptoms.   [name], DPM

## 2017-01-11 ENCOUNTER — Ambulatory Visit (INDEPENDENT_AMBULATORY_CARE_PROVIDER_SITE_OTHER): Payer: Self-pay | Admitting: Podiatry

## 2017-01-11 DIAGNOSIS — M2042 Other hammer toe(s) (acquired), left foot: Secondary | ICD-10-CM

## 2017-01-11 DIAGNOSIS — M2011 Hallux valgus (acquired), right foot: Secondary | ICD-10-CM

## 2017-01-11 NOTE — Progress Notes (Signed)
Subjective: Christine [name] is a 66 y.o. is seen today in office s/p hammertoe repair of digits 2-4 on the left foot preformed on 12-30-16. They state their pain is improved and she is not taking any pain medication. She has remained in the CAM boot.  Denies any systemic complaints such as fevers, chills, nausea, vomiting. No calf pain, chest pain, shortness of breath.   Objective: General: No acute distress, AAOx3  DP/PT pulses palpable 2/4, CRT < 3 sec to all digits.  Protective sensation intact. Motor function intact.  Left foot: Incision is well coapted without any evidence of dehiscence and sutures are intact. There is faint and much improved erythema to the toes which was likely from inflammation as opposed to infection. There is no ascending cellulitis, fluctuance, crepitus, malodor, drainage/purulence. There is decreased edema around the surgical site. There is no pain along the surgical site. The toes are in a rectus position of the second toenail distally is slightly rotated in a medial direction. No other areas of tenderness to bilateral lower extremities.  No other open lesions or pre-ulcerative lesions.  No pain with calf compression, swelling, warmth, erythema.   Assessment and Plan:  Status post left hammer toe repair, doing well with no complications   -Treatment options discussed including all alternatives, risks, and complications -Every other suture was removed today. Antibiotic was applied followed by a bandage. Keep clean, dry, intact. -She can transition to a surgical shoe today. -Ice/elevation- she has not been doing this.  -Pain medication as needed. -Monitor for any clinical signs or symptoms of infection and DVT/PE and directed to call the office immediately should any occur or go to the ER. -Follow-up in 1 week forsuture removal or sooner if any problems arise. In the meantime, encouraged to call the office with any questions, concerns, change in symptoms.   [name], DPM

## 2017-01-18 ENCOUNTER — Ambulatory Visit (INDEPENDENT_AMBULATORY_CARE_PROVIDER_SITE_OTHER): Payer: PPO | Admitting: Podiatry

## 2017-01-18 ENCOUNTER — Ambulatory Visit (INDEPENDENT_AMBULATORY_CARE_PROVIDER_SITE_OTHER): Payer: PPO

## 2017-01-18 DIAGNOSIS — M2042 Other hammer toe(s) (acquired), left foot: Secondary | ICD-10-CM | POA: Diagnosis not present

## 2017-01-18 DIAGNOSIS — M2011 Hallux valgus (acquired), right foot: Secondary | ICD-10-CM | POA: Diagnosis not present

## 2017-01-19 NOTE — Progress Notes (Signed)
Subjective: Christine [name] is a 66 y.o. is seen today in office s/p hammertoe repair of digits 2-4 on the left foot preformed on 12-30-16. She is still not taking any pain medications and she is doing well. She has remained in the surgical shoe. She presents today for suture removal. Denies any systemic complaints such as fevers, chills, nausea, vomiting. No calf pain, chest pain, shortness of breath.   Objective: General: No acute distress, AAOx3  DP/PT pulses palpable 2/4, CRT < 3 sec to all digits.  Protective sensation intact. Motor function intact.  Left foot: Incision is well coapted without any evidence of dehiscence and sutures are intact. There is no surrounidng erythema .ascending cellulitis, fluctuance, crepitus, malodor, drainage/purulence. There is trace edema around the surgical site. There is no pain along the surgical site. The toes are in a rectus position. The 2nd toenail actually appears to be more straight today and rotated in a more rectus position.  No other open lesions or pre-ulcerative lesions.  No pain with calf compression, swelling, warmth, erythema.   Assessment and Plan:  Status post left hammer toe repair, doing well with no complications   -Treatment options discussed including all alternatives, risks, and complications -Remainder of the sutures were removed today. Antibiotic was applied followed by a bandage. Keep clean, dry, intact. -Continue with surgical shoe.  -Ice/elevation -Pain medication as needed. She is not taking any.  -Monitor for any clinical signs or symptoms of infection and DVT/PE and directed to call the office immediately should any occur or go to the ER. -Follow-up in 2 weeks or sooner if any problems arise. In the meantime, encouraged to call the office with any questions, concerns, change in symptoms.   [name], DPM

## 2017-02-01 ENCOUNTER — Encounter: Payer: Self-pay | Admitting: Podiatry

## 2017-02-01 ENCOUNTER — Ambulatory Visit (INDEPENDENT_AMBULATORY_CARE_PROVIDER_SITE_OTHER): Payer: PPO | Admitting: Podiatry

## 2017-02-01 ENCOUNTER — Ambulatory Visit (INDEPENDENT_AMBULATORY_CARE_PROVIDER_SITE_OTHER): Payer: PPO

## 2017-02-01 VITALS — BP 158/99 | HR 84

## 2017-02-01 DIAGNOSIS — Z9889 Other specified postprocedural states: Secondary | ICD-10-CM

## 2017-02-01 DIAGNOSIS — M2042 Other hammer toe(s) (acquired), left foot: Secondary | ICD-10-CM

## 2017-02-01 MED ORDER — DESOXIMETASONE 0.25 % EX CREA: 1.0000 "application " | TOPICAL_CREAM | Freq: Two times a day (BID) | CUTANEOUS | 0 refills | Status: DC

## 2017-02-07 NOTE — Progress Notes (Signed)
Subjective: Christine [name] is a 66 y.o. is seen today in office s/p hammertoe repair of digits 2-4 on the left foot preformed on 12-30-16. She is still not taking any pain medications and she is doing well. She states she has had some swelling and pain of the fourth toe but otherwise she is doing better. She has remained in the surgical shoe. She presents today for suture removal. Denies any systemic complaints such as fevers, chills, nausea, vomiting. No calf pain, chest pain, shortness of breath.   Objective: General: No acute distress, AAOx3  DP/PT pulses palpable 2/4, CRT < 3 sec to all digits.  Protective sensation intact. Motor function intact.  Left foot: Incision is well coapted without any evidence of dehiscence and a scar has formed. There is no surrounidng erythema, ascending cellulitis, fluctuance, crepitus, malodor, drainage/purulence. There is trace edema around the surgical site. The majority tenderness and edemaappears to the fourth toe however this is improved. There is no pain along the surgical sites otherwise. The toes are in a rectus position. The 2nd toenail actually appears to be more straight today and rotated in a more rectus position.  No other open lesions or pre-ulcerative lesions.  No pain with calf compression, swelling, warmth, erythema.   Assessment and Plan:  Status post left hammer toe repair, doing well with no complications   -Treatment options discussed including all alternatives, risks, and complications -x-rays obtained reviewed. No evidence of acute fracture. Hardware intact. -Continue with surgical shoe. [name] is a total of 6 weeks before she transitions to a regular shoe. -Ice/elevation -Pain medication as needed. She is not taking any urrently. -Monitor for any clinical signs or symptoms of infection and DVT/PE and directed to call the office immediately should any occur or go to the ER. -Follow-up as scheduled or sooner if any problems arise. In the  meantime, encouraged to call the office with any questions, concerns, change in symptoms.   [name], DPM

## 2017-03-01 ENCOUNTER — Ambulatory Visit (INDEPENDENT_AMBULATORY_CARE_PROVIDER_SITE_OTHER): Payer: PPO

## 2017-03-01 ENCOUNTER — Ambulatory Visit (INDEPENDENT_AMBULATORY_CARE_PROVIDER_SITE_OTHER): Payer: Self-pay | Admitting: Podiatry

## 2017-03-01 DIAGNOSIS — M2042 Other hammer toe(s) (acquired), left foot: Secondary | ICD-10-CM

## 2017-03-01 DIAGNOSIS — Z9889 Other specified postprocedural states: Secondary | ICD-10-CM

## 2017-03-01 MED ORDER — DOXYCYCLINE HYCLATE 100 MG PO TABS: 100.0000 mg | ORAL_TABLET | Freq: Two times a day (BID) | ORAL | 0 refills | Status: DC

## 2017-03-04 NOTE — Progress Notes (Signed)
Subjective: Christine [name] is a 66 y.o. is seen today in office s/p hammertoe repair of digits 2-4 on the left foot preformed on 12-30-16. She feels that overall she is doing well. The fourth toe still somewhat swollen however she is able to wear a loose shoe. She is not taking any pain medication. She is able to do daily activities minimal pain. Denies any systemic complaints such as fevers, chills, nausea, vomiting. No calf pain, chest pain, shortness of breath.   Objective: General: No acute distress, AAOx3  DP/PT pulses palpable 2/4, CRT < 3 sec to all digits.  Protective sensation intact. Motor function intact.  Left foot: Incision is well coapted without any evidence of dehiscence and a scar has formed. There is trace edema to the second third digits how there does appear to be some mild to moderate [place] the fourth toe is bent amount of erythema but there is no ascending cellulitis, warmth the toe. This is likely more inflammation as opposed infection by the looks of it. The toes are in rectus position. No other open lesions or pre-ulcerative lesions.  No pain with calf compression, swelling, warmth, erythema.   Assessment and Plan:  Status post left hammer toe repair, with residual swelling  -Treatment options discussed including all alternatives, risks, and complications -X-rays obtained and reviewed. There is no evidence of acute fracture. Hardware intact. No evidence of acute osteomyelitis. -Recommended her to continue with taping the toes to help with swelling. Given the swelling to the fourth toe I will restart antibiotics in case of infection given her multiple surgeries although I do not believe this is a true infection. -I will see her back in 2 weeks or sooner if needed. Encouraged to call any questions or concerns or any change in symptoms.  [name], DPM

## 2017-04-02 ENCOUNTER — Ambulatory Visit (INDEPENDENT_AMBULATORY_CARE_PROVIDER_SITE_OTHER): Payer: PPO | Admitting: Podiatry

## 2017-04-02 ENCOUNTER — Encounter: Payer: Self-pay | Admitting: Podiatry

## 2017-04-02 ENCOUNTER — Ambulatory Visit: Payer: PPO

## 2017-04-02 ENCOUNTER — Ambulatory Visit (INDEPENDENT_AMBULATORY_CARE_PROVIDER_SITE_OTHER): Payer: PPO

## 2017-04-02 DIAGNOSIS — M2042 Other hammer toe(s) (acquired), left foot: Secondary | ICD-10-CM

## 2017-04-02 DIAGNOSIS — M898X7 Other specified disorders of bone, ankle and foot: Secondary | ICD-10-CM

## 2017-04-02 DIAGNOSIS — M799 Soft tissue disorder, unspecified: Secondary | ICD-10-CM | POA: Diagnosis not present

## 2017-04-02 DIAGNOSIS — S92525K Nondisplaced fracture of medial phalanx of left lesser toe(s), subsequent encounter for fracture with nonunion: Secondary | ICD-10-CM

## 2017-04-02 DIAGNOSIS — M7989 Other specified soft tissue disorders: Secondary | ICD-10-CM

## 2017-04-05 NOTE — Progress Notes (Addendum)
Subjective: Christine [name] is a 66 y.o. is seen today in office s/p hammertoe repair of digits 2-4 on the left foot preformed on 12-30-16. She presents today for increased pain mostly to the second toe. She gets still some pain to the fourth toe as well as she has morbid stinging sensation. The third toes doing well. She denies any recent injury or trauma she's been wearing a regular shoe. She said this finds actually improved to her toes and the redness that she had to the fourth toe is also resolved.  She has a has concerns of a possible cyst on the right foot and this over the area where she's been having this chronic rash, itching sensation.  Denies any systemic complaints such as fevers, chills, nausea, vomiting. No calf pain, chest pain, shortness of breath.   Objective: General: No acute distress, AAOx3  DP/PT pulses palpable 2/4, CRT < 3 sec to all digits.  Protective sensation intact. Motor function intact.  Left foot: Incision is well coapted without any evidence of dehiscence and a scar has formed. The second toe has slight recurrent hammertoe contracture however's and much more rectus position compared to surgery. The other toes are still rectus. There is still mild swelling to the surgical toes have is no severe erythema or increase in warmth. This no clinical signs of infection present. Right foot: There is tenderness along the plantar medial aspect of the midfoot along the navicular cuneiform joint approximately this area. There is no palpable cyst identified this time. However there is a chronic rash overlying this area which subjectively does itch at times. There is no open sores identified. No other open lesions or pre-ulcerative lesions.  No pain with calf compression, swelling, warmth, erythema.   Assessment and Plan:  Status post left hammer toe repair, with residual swelling; toe fracture with nonunion  -Treatment options discussed including all alternatives, risks, and  complications -X-rays obtained and reviewed. Discrete and break in the second toe. Hardware intact the other digits. There are still radiolucent line across the arthrodesis site on the PIPJ of the second fourth toes over the area where she's having pain consistent with a nonunion. Because of this I will try to get a bone stimulator ordered. I did contact the representative for [employer]. -X-rays were obtained and reviewed on the right foot as well. No evidence of acute fracture or ossification present in soft tissue. This midfoot arthritic changes present. Given her consent for possible cyst and I mild palpate this today we will order an ultrasound of this area to further evaluate. Also discussed with her a biopsy of the skin rash ever she's going to the beach next week and we will does that she comes back.  [name], DPM

## 2017-04-06 ENCOUNTER — Telehealth: Payer: Self-pay | Admitting: *Deleted

## 2017-04-06 DIAGNOSIS — M7989 Other specified soft tissue disorders: Secondary | ICD-10-CM

## 2017-04-06 NOTE — Telephone Encounter (Addendum)
-----   Message from Trula Slade, DPM sent at 04/05/2017  5:26 PM EDT ----- Can you please order and ultrasound of the right foot along the plantar medial midfoot to rule out soft tissue mass?04/06/2017-T[facility] - [facility] picked up clinicals, and demographics for pre-cert.  Faxed orders for [place] to [place] [facility].05/13/2017-Pt states had surgery 05/12/2017 and Dr. Jacqualyn Posey prescribed Keflex and she is allergic to it. Dr. Jacqualyn Posey states he called in clindamycin yesterday. I informed pt and asked how she is doing and she stated very well. I instructed her to remain in the boot and not to weight bear more than 15 minutes/hour.05/27/2017-Dr. Jacqualyn Posey reviewed biopsy results 05/12/2017 as contact dermatitis. I informed pt and she said she would talk with Dr. Jacqualyn Posey at next visit.

## 2017-04-19 DIAGNOSIS — R030 Elevated blood-pressure reading, without diagnosis of hypertension: Secondary | ICD-10-CM | POA: Diagnosis not present

## 2017-04-19 DIAGNOSIS — M159 Polyosteoarthritis, unspecified: Secondary | ICD-10-CM | POA: Diagnosis not present

## 2017-04-19 DIAGNOSIS — E7800 Pure hypercholesterolemia, unspecified: Secondary | ICD-10-CM | POA: Diagnosis not present

## 2017-04-19 DIAGNOSIS — F3289 Other specified depressive episodes: Secondary | ICD-10-CM | POA: Diagnosis not present

## 2017-04-20 ENCOUNTER — Ambulatory Visit
Admission: RE | Admit: 2017-04-20 | Discharge: 2017-04-20 | Disposition: A | Discharge status: Home / Self Care | Payer: PPO | Source: Ambulatory Visit | Attending: Podiatry | Admitting: Podiatry

## 2017-04-20 DIAGNOSIS — M7989 Other specified soft tissue disorders: Secondary | ICD-10-CM

## 2017-04-20 DIAGNOSIS — R21 Rash and other nonspecific skin eruption: Secondary | ICD-10-CM | POA: Diagnosis not present

## 2017-04-23 ENCOUNTER — Other Ambulatory Visit: Payer: 59

## 2017-05-03 ENCOUNTER — Ambulatory Visit: Payer: PPO | Admitting: Podiatry

## 2017-05-06 ENCOUNTER — Ambulatory Visit (INDEPENDENT_AMBULATORY_CARE_PROVIDER_SITE_OTHER): Payer: PPO | Admitting: Podiatry

## 2017-05-06 ENCOUNTER — Encounter: Payer: Self-pay | Admitting: Podiatry

## 2017-05-06 DIAGNOSIS — R21 Rash and other nonspecific skin eruption: Secondary | ICD-10-CM

## 2017-05-06 DIAGNOSIS — M2032 Hallux varus (acquired), left foot: Secondary | ICD-10-CM

## 2017-05-06 DIAGNOSIS — Z969 Presence of functional implant, unspecified: Secondary | ICD-10-CM | POA: Diagnosis not present

## 2017-05-06 NOTE — Patient Instructions (Signed)

## 2017-05-09 NOTE — Progress Notes (Signed)
Subjective: 66 year old female presents the office today for follow-up evaluation of right foot to discuss ultrasound results. She also said that she continues to get pain mostly to the second toe on the left foot. She states the left second toe is also starting to become crooked again. She states the third and fourth toe are doing better. She still gets some intermittent pain of the fourth toe but she has not had any redness or warmth or any swelling. Majority of her concerns are to the second toe on the left foot as well as a skin rash on the right foot which continues. She says the rash and the right foot continues to itch. Denies any systemic complaints such as fevers, chills, nausea, vomiting. No acute changes since last appointment, and no other complaints at this time.   Objective: AAO x3, NAD DP/PT pulses palpable bilaterally, CRT less than 3 seconds This prominence of implanted aspect of the navicular tuberosity area on the right foot. There is not painful. Unable to palpate any kind of cyst. Erythematous skin rash continues on the right foot on the medial aspect of the midfoot without any skin breakdown. There is evidence of excoriations from her scratching.  On the left foot there is tenderness to the left second toe and there is lateral deviation of the second toe at the level of the PIPJ with tenderness this area. The third and fourth toes are rectus position and there is minimal tenderness to palpation. There is no edema, erythema, drainage or pus or any clinical signs of infection to the toes. No open lesions or pre-ulcerative lesions.  No pain with calf compression, swelling, warmth, erythema  Assessment: Skin rash right foot no evidence of cyst; broken hardware left second toe with digital deformity  Plan: -All treatment options discussed with the patient including all alternatives, risks, complications.  -Review the ultrasound results with the patient. At this time discussed the  surgical intervention for the left second toe as well as biopsy of the right foot skin rash. We discussed intervention for the fourth toe on the left foot. Her pain overall she states is improving and overall the toe clinically appears much improved. Because this would like to the toe heal given her issues of this toe previously. At some point we can discuss taking the screw out. Also discussed the bone similar. -Regards to surgery for the hammertoe on the left foot discuss hardware removal, revision of hammertoe repair with screw/wire fixation. Also discussed is a chance that the screw may not feel to be removed. Also discussed biopsy to the skin rash on the right foot. She wishes to proceed with surgical intervention as soon as possible. She understands this is no promise or guarantee as the outcome of the surgery she may continue to have pain or digital deformity. -The incision placement as well as the postoperative course was discussed with the patient. I discussed risks of the surgery which include, but not limited to, infection, bleeding, pain, swelling, need for further surgery, delayed or nonhealing, painful or ugly scar, numbness or sensation changes, over/under correction, recurrence, transfer lesions, further deformity, hardware failure, DVT/PE, loss of toe/foot. Patient understands these risks and wishes to proceed with surgery. The surgical consent was reviewed with the patient all 3 pages were signed. No promises or guarantees were given to the outcome of the procedure. All questions were answered to the best of my ability. Before the surgery the patient was encouraged to call the office if there is  any further questions. The surgery will be performed at the [facility] on an outpatient basis.  [name], DPM

## 2017-05-12 ENCOUNTER — Encounter: Payer: Self-pay | Admitting: Podiatry

## 2017-05-12 ENCOUNTER — Other Ambulatory Visit: Payer: Self-pay | Admitting: Podiatry

## 2017-05-12 DIAGNOSIS — L989 Disorder of the skin and subcutaneous tissue, unspecified: Secondary | ICD-10-CM | POA: Diagnosis not present

## 2017-05-12 DIAGNOSIS — M2042 Other hammer toe(s) (acquired), left foot: Secondary | ICD-10-CM | POA: Diagnosis not present

## 2017-05-12 DIAGNOSIS — D509 Iron deficiency anemia, unspecified: Secondary | ICD-10-CM | POA: Diagnosis not present

## 2017-05-12 DIAGNOSIS — R21 Rash and other nonspecific skin eruption: Secondary | ICD-10-CM | POA: Diagnosis not present

## 2017-05-12 DIAGNOSIS — D492 Neoplasm of unspecified behavior of bone, soft tissue, and skin: Secondary | ICD-10-CM | POA: Diagnosis not present

## 2017-05-12 MED ORDER — CLINDAMYCIN HCL 300 MG PO CAPS: 300.0000 mg | ORAL_CAPSULE | Freq: Three times a day (TID) | ORAL | 0 refills | Status: DC

## 2017-05-12 NOTE — Progress Notes (Signed)
Patient after surgery stated that she cannot take keflex. I ordered clinda 300mg  for 7 days for prophylaxis.

## 2017-05-14 ENCOUNTER — Telehealth: Payer: Self-pay | Admitting: *Deleted

## 2017-05-14 MED ORDER — CLINDAMYCIN HCL 300 MG PO CAPS: 300.0000 mg | ORAL_CAPSULE | Freq: Three times a day (TID) | ORAL | 0 refills | Status: DC

## 2017-05-14 NOTE — Telephone Encounter (Signed)
Patient states Rx for a different antibiotic was supposed to called in and didn't get it.  Spoke with Dr. Jacqualyn Posey and states he sent in Clindamycin 500mg  yesterday-will send again. Informed pt

## 2017-05-17 ENCOUNTER — Ambulatory Visit (INDEPENDENT_AMBULATORY_CARE_PROVIDER_SITE_OTHER): Payer: PPO

## 2017-05-17 ENCOUNTER — Ambulatory Visit (INDEPENDENT_AMBULATORY_CARE_PROVIDER_SITE_OTHER): Payer: Self-pay | Admitting: Podiatry

## 2017-05-17 ENCOUNTER — Ambulatory Visit: Payer: PPO

## 2017-05-17 DIAGNOSIS — Z969 Presence of functional implant, unspecified: Secondary | ICD-10-CM

## 2017-05-17 DIAGNOSIS — M2032 Hallux varus (acquired), left foot: Secondary | ICD-10-CM

## 2017-05-17 DIAGNOSIS — R21 Rash and other nonspecific skin eruption: Secondary | ICD-10-CM

## 2017-05-18 NOTE — Progress Notes (Signed)
Subjective: Christine [name] is a 66 y.o. is seen today in office s/p left foot 2nd digit revisional hammertoe repair and right foot skin biopsy preformed on 05/12/2017. They state their pain is controlled. She has remained in the CAM boot. She has been icing and elevating. She is asking to stop her antibiotics due to "starting thrush symptoms".  Denies any systemic complaints such as fevers, chills, nausea, vomiting. No calf pain, chest pain, shortness of breath.   Objective: General: No acute distress, AAOx3  DP/PT pulses palpable 2/4, CRT < 3 sec to all digits.  Protective sensation intact. Motor function intact.  Left and right foot: Incision is well coapted without any evidence of dehiscence. There is no surrounding erythema, ascending cellulitis, fluctuance, crepitus, malodor, drainage/purulence. There is mild edema around the surgical site on the left side. There is minimal pain along the surgical site on the left but not on the right. The ptosis in rectus position on the left foot and the K wires firmly intact for second toe without any drainage or pus or any loosening.  No other areas of tenderness to bilateral lower extremities.  No other open lesions or pre-ulcerative lesions.  No pain with calf compression, swelling, warmth, erythema.   Assessment and Plan:  Status post bilateral, doing well with no complications   -Treatment options discussed including all alternatives, risks, and complications -X-rays were obtained and reviewed with the patient. K wire intact the second toe with evidence of recent arthrodesis of the second digit. No evidence of acute fracture identified. -Suture was removed on the right foot. Continue antibiotic ointment dressing changes daily. Awaiting pathology results. -On the left foot antibiotic ointment was applied on the incision as well as a dry sterile bandage. Keep the dressing clean, dry, intact. Continue surgical boot/shoe. -Can stop antibiotics. This is for  prophylactic measures. -Ice/elevation -Pain medication as needed. -Monitor for any clinical signs or symptoms of infection and DVT/PE and directed to call the office immediately should any occur or go to the ER. -Follow-up in 1 week for possible suture removal or sooner if any problems arise. In the meantime, encouraged to call the office with any questions, concerns, change in symptoms.   [name], DPM

## 2017-05-24 ENCOUNTER — Ambulatory Visit (INDEPENDENT_AMBULATORY_CARE_PROVIDER_SITE_OTHER): Payer: PPO | Admitting: Podiatry

## 2017-05-24 DIAGNOSIS — M2032 Hallux varus (acquired), left foot: Secondary | ICD-10-CM

## 2017-05-24 DIAGNOSIS — R21 Rash and other nonspecific skin eruption: Secondary | ICD-10-CM

## 2017-05-26 NOTE — Progress Notes (Signed)
Subjective: Christine [name] is a 66 y.o. is seen today in office s/p left foot 2nd digit revisional hammertoe repair and right foot skin biopsy preformed on 05/12/2017. Presents today for suture removal. POV #2. She states that she is doing well and she has no complaints. She has remained in the surgical shoe. She is not taking pain medication.    They state their pain is controlled. She has remained in the CAM boot. She has been icing and elevating. She is asking to stop her antibiotics due to "starting thrush symptoms".  Denies any systemic complaints such as fevers, chills, nausea, vomiting. No calf pain, chest pain, shortness of breath.   Objective: General: No acute distress, AAOx3  DP/PT pulses palpable 2/4, CRT < 3 sec to all digits.  Protective sensation intact. Motor function intact.  Left and right foot: Incision is well coapted without any evidence of dehiscence and sutures are intact. There is no surrounding erythema, ascending cellulitis, fluctuance, crepitus, malodor, drainage/purulence. There is mild edema around the surgical site on the left side. There is minimal pain along the surgical site on the left but not on the right. The toe is in rectus position on the left foot and the K wires firmly intact for second toe without any drainage or pus or any loosening.  No other areas of tenderness to bilateral lower extremities.  No other open lesions or pre-ulcerative lesions.  No pain with calf compression, swelling, warmth, erythema.   Assessment and Plan:  Status post bilateral, doing well with no complications   -Treatment options discussed including all alternatives, risks, and complications -Sutures removed today without complications. Incision remained coapted. Antibiotic ointment was applied followed by a bandage. Keep dressing clean, dry, intact.  -Continue surgical shoe -Ice/elvation -Biopsy still pending on the right- called to check status.  -Monitor for any clinical signs  or symptoms of infection and DVT/PE and directed to call the office immediately should any occur or go to the ER. -Follow-up in 2 weeks or sooner if any problems arise. In the meantime, encouraged to call the office with any questions, concerns, change in symptoms.   [name], DPM

## 2017-05-28 NOTE — Progress Notes (Signed)
DOS 06.13.2018 DOS 06.13.2018 Revisional hammertoe repair left 2nd (second) toe with wire/screw fixation, biopsy right foot.

## 2017-06-10 ENCOUNTER — Encounter: Payer: Self-pay | Admitting: Podiatry

## 2017-06-14 ENCOUNTER — Ambulatory Visit (INDEPENDENT_AMBULATORY_CARE_PROVIDER_SITE_OTHER): Payer: Self-pay | Admitting: Podiatry

## 2017-06-14 ENCOUNTER — Ambulatory Visit (INDEPENDENT_AMBULATORY_CARE_PROVIDER_SITE_OTHER): Payer: PPO

## 2017-06-14 DIAGNOSIS — M2042 Other hammer toe(s) (acquired), left foot: Secondary | ICD-10-CM | POA: Diagnosis not present

## 2017-06-14 DIAGNOSIS — R21 Rash and other nonspecific skin eruption: Secondary | ICD-10-CM

## 2017-06-17 DIAGNOSIS — I10 Essential (primary) hypertension: Secondary | ICD-10-CM | POA: Diagnosis not present

## 2017-06-17 DIAGNOSIS — Z780 Asymptomatic menopausal state: Secondary | ICD-10-CM | POA: Diagnosis not present

## 2017-06-18 MED ORDER — TRIAMCINOLONE ACETONIDE 0.1 % EX CREA: 1.0000 "application " | TOPICAL_CREAM | Freq: Two times a day (BID) | CUTANEOUS | 0 refills | Status: DC

## 2017-06-18 NOTE — Progress Notes (Signed)
Subjective: Christine [name] is a 66 y.o. is seen today in office s/p left foot 2nd digit revisional hammertoe repair and right foot skin biopsy preformed on 05/12/2017. Presents today for suture removal. POV #3. She states that she is doing well and she has no complaints. She has remained in the surgical shoe. She is not taking pain medication. She says the toes on the left but has not been hurting her.   Denies any systemic complaints such as fevers, chills, nausea, vomiting. No calf pain, chest pain, shortness of breath.   Objective: General: No acute distress, AAOx3  DP/PT pulses palpable 2/4, CRT < 3 sec to all digits.  Protective sensation intact. Motor function intact.  Left and right foot: Incision is well coapted without any evidence of dehiscence and a scar is formed. The toe sits in rectus position. K wire intact the second toe without any drainage or pus any signs of infection. The third and fourth digits are also sitting in a rectus position and there is minimal swelling. There is no severe pain on palpation of the toe. Subjective the she still gets pain medication to the fourth toe. Skin biopsy site on the right foot is well-healed. Occasional still been some itching. No red streaks but some faint erythema, scaly skin. No other open lesions or pre-ulcerative lesions.  No pain with calf compression, swelling, warmth, erythema.   Assessment and Plan:  Status post bilateral, doing well with no complications   -Treatment options discussed including all alternatives, risks, and complications -X-rays were obtained and reviewed. Hardware intact the second toe as well as the third and fourth toe. No evidence of acute fracture. -Will leave the K wire intact the other 2 weeks. Continue surgical shoe, ice and elevation. Pain medication if needed but she has not been requiring this. -Will try triamcinolone cream for the rash right foot. -RTC 2 weeks or sooner if needed. -Monitor for any clinical  signs or symptoms of infection and directed to call the office immediately should any occur or go to the ER.  [name], DPM

## 2017-06-22 ENCOUNTER — Ambulatory Visit: Payer: PPO | Admitting: Podiatry

## 2017-06-22 ENCOUNTER — Telehealth: Payer: Self-pay | Admitting: Podiatry

## 2017-06-22 NOTE — Telephone Encounter (Signed)
I spoke with pt she states she rinsed area with peroxide and covered with a clean sock. I told pt to cover tip of toe with neosporin and a bandaid and she needed to be seen today or tomorrow. Transferred to schedulers.

## 2017-06-22 NOTE — Telephone Encounter (Signed)
I was in bed last night and the pin came all the way out of my toe. I'm scheduled to come in on Monday. I don't know if he still wants to see me.

## 2017-06-24 ENCOUNTER — Encounter: Payer: Self-pay | Admitting: Podiatry

## 2017-06-24 ENCOUNTER — Ambulatory Visit (INDEPENDENT_AMBULATORY_CARE_PROVIDER_SITE_OTHER): Payer: Self-pay | Admitting: Podiatry

## 2017-06-24 ENCOUNTER — Ambulatory Visit (INDEPENDENT_AMBULATORY_CARE_PROVIDER_SITE_OTHER): Payer: PPO

## 2017-06-24 DIAGNOSIS — Z9889 Other specified postprocedural states: Secondary | ICD-10-CM

## 2017-06-24 DIAGNOSIS — M2042 Other hammer toe(s) (acquired), left foot: Secondary | ICD-10-CM

## 2017-06-25 NOTE — Progress Notes (Signed)
Subjective: Christine [name] is a 66 y.o. is seen today in office s/p left foot 2nd digit revisional hammertoe repair and right foot skin biopsy preformed on 05/12/2017. Presents today for suture removal. POV #4, as an unscheduled appointment. She states that the k-wire came out on Monday night. Since then she says some swelling to the top of her foot. Because the swelling she had some antibiotic home she started to take in case of infection.She's remain in the surgical shoe. She states that when the pain came out she was not wearing the surgical shoe. Denies any injury that she can recall. Denies any systemic complaints such as fevers, chills, nausea, vomiting. No calf pain, chest pain, shortness of breath.   Objective: General: No acute distress, AAOx3  DP/PT pulses palpable 2/4, CRT < 3 sec to all digits.  Protective sensation intact. Motor function intact.  Left and right foot: Incision is well coapted without any evidence of dehiscence and a scar is formed. K wires not intact and second toe however the toe does still sit in a rectus position appears to be unchanged. Arthrodesis site appears to be stable. There does appear to be some mild increase in swelling to the dorsal aspect of the foot there is no specific area pinpoint tenderness. There is no erythema or increase in warmth. The third and fourth toes sit in a rectus position. Skin biopsy site on the right foot is well-healed. She states that any steroid has helped.  g, warmth, erythema.   Assessment and Plan:  Status post bilateral  -Treatment options discussed including all alternatives, risks, and complications -X-rays were obtained and reviewed. Hardware intact the third and fourth digits. K wires not present. There is no evidence of acute fracture.  -Recommended to remain in the surgical shoe. She can tape the toes to help with swelling. Also Ace bandages applied help with swelling to the foot.  -Continue ice and elevation  -No signs of  infection and would hold off on antibiotics  -RTC 2 weeks or sooner if needed. -Monitor for any clinical signs or symptoms of infection and directed to call the office immediately should any occur or go to the ER.  [name], DPM

## 2017-06-28 ENCOUNTER — Ambulatory Visit: Payer: PPO | Admitting: Podiatry

## 2017-07-08 ENCOUNTER — Encounter: Payer: Self-pay | Admitting: Podiatry

## 2017-07-08 ENCOUNTER — Ambulatory Visit (INDEPENDENT_AMBULATORY_CARE_PROVIDER_SITE_OTHER): Payer: PPO | Admitting: Podiatry

## 2017-07-08 ENCOUNTER — Ambulatory Visit (INDEPENDENT_AMBULATORY_CARE_PROVIDER_SITE_OTHER): Payer: PPO

## 2017-07-08 DIAGNOSIS — M2042 Other hammer toe(s) (acquired), left foot: Secondary | ICD-10-CM

## 2017-07-08 DIAGNOSIS — Z9889 Other specified postprocedural states: Secondary | ICD-10-CM

## 2017-07-08 NOTE — Progress Notes (Signed)
Subjective: Christine [name] is a 66 y.o. is seen today in office s/p right 2nd toe hammertoe repair preformed on 05/12/2017. She states that she is having no pain to the 2nd toe. She has started to go back into a regular shoe yesterday and she states it feels "fine". She still gets some occasional pain to the 4th toe but no swelling or redness. Denies any systemic complaints such as fevers, chills, nausea, vomiting. No calf pain, chest pain, shortness of breath.   Objective: General: No acute distress, AAOx3  DP/PT pulses palpable 2/4, CRT < 3 sec to all digits.  Protective sensation intact. Motor function intact.  LEFT foot: Incision is well coapted without any evidence of dehiscence and a scar is well formed. There is no surrounding erythema, ascending cellulitis, fluctuance, crepitus, malodor, drainage/purulence. There is minmal edema around the surgical site. There is no pain along the surgical site. Toes are in a rectus position.  No other areas of tenderness to bilateral lower extremities.  No other open lesions or pre-ulcerative lesions.  No pain with calf compression, swelling, warmth, erythema.   Assessment and Plan:  Status post left 2nd hammertoe repair, doing well with no complications   -Treatment options discussed including all alternatives, risks, and complications -X-rays were obtained and reviewed with the patient. Hardware intact. 2nd toe is in rectus position. There is no evidence of acute fracture.  -She can continue with regular shoes but gradually increase her activity level.  -Ice/elevation -Pain medication as needed. -She still gets some discomfort to the 4th toe. I think we should try to get back into a regular shoe and see how she does. In the future may consider HWR.  -Monitor for any clinical signs or symptoms of infection and DVT/PE and directed to call the office immediately should any occur or go to the ER. -Follow-up in 3 months  or sooner if any problems arise. In  the meantime, encouraged to call the office with any questions, concerns, change in symptoms.   [name], DPM

## 2017-07-28 DIAGNOSIS — K5903 Drug induced constipation: Secondary | ICD-10-CM | POA: Diagnosis not present

## 2017-07-28 DIAGNOSIS — I10 Essential (primary) hypertension: Secondary | ICD-10-CM | POA: Diagnosis not present

## 2017-07-28 DIAGNOSIS — K219 Gastro-esophageal reflux disease without esophagitis: Secondary | ICD-10-CM | POA: Diagnosis not present

## 2017-09-08 DIAGNOSIS — Z23 Encounter for immunization: Secondary | ICD-10-CM | POA: Diagnosis not present

## 2017-09-15 DIAGNOSIS — H10412 Chronic giant papillary conjunctivitis, left eye: Secondary | ICD-10-CM | POA: Diagnosis not present

## 2017-09-15 DIAGNOSIS — H5203 Hypermetropia, bilateral: Secondary | ICD-10-CM | POA: Diagnosis not present

## 2017-09-15 DIAGNOSIS — H524 Presbyopia: Secondary | ICD-10-CM | POA: Diagnosis not present

## 2017-09-29 DIAGNOSIS — Z1231 Encounter for screening mammogram for malignant neoplasm of breast: Secondary | ICD-10-CM | POA: Diagnosis not present

## 2017-09-29 DIAGNOSIS — Z01419 Encounter for gynecological examination (general) (routine) without abnormal findings: Secondary | ICD-10-CM | POA: Diagnosis not present

## 2017-09-29 DIAGNOSIS — Z6830 Body mass index (BMI) 30.0-30.9, adult: Secondary | ICD-10-CM | POA: Diagnosis not present

## 2017-09-29 DIAGNOSIS — R350 Frequency of micturition: Secondary | ICD-10-CM | POA: Diagnosis not present

## 2017-10-19 DIAGNOSIS — Z119 Encounter for screening for infectious and parasitic diseases, unspecified: Secondary | ICD-10-CM | POA: Diagnosis not present

## 2017-10-19 DIAGNOSIS — K5900 Constipation, unspecified: Secondary | ICD-10-CM | POA: Diagnosis not present

## 2017-10-19 DIAGNOSIS — K219 Gastro-esophageal reflux disease without esophagitis: Secondary | ICD-10-CM | POA: Diagnosis not present

## 2017-10-19 DIAGNOSIS — I10 Essential (primary) hypertension: Secondary | ICD-10-CM | POA: Diagnosis not present

## 2017-11-02 DIAGNOSIS — M1712 Unilateral primary osteoarthritis, left knee: Secondary | ICD-10-CM | POA: Diagnosis not present

## 2017-11-02 DIAGNOSIS — M5136 Other intervertebral disc degeneration, lumbar region: Secondary | ICD-10-CM | POA: Diagnosis not present

## 2017-11-15 DIAGNOSIS — M545 Low back pain: Secondary | ICD-10-CM | POA: Diagnosis not present

## 2017-11-18 ENCOUNTER — Other Ambulatory Visit: Payer: Self-pay | Admitting: Orthopedic Surgery

## 2017-11-18 DIAGNOSIS — M545 Low back pain: Principal | ICD-10-CM

## 2017-11-18 DIAGNOSIS — G8929 Other chronic pain: Secondary | ICD-10-CM

## 2017-12-06 ENCOUNTER — Ambulatory Visit
Admission: RE | Admit: 2017-12-06 | Discharge: 2017-12-06 | Disposition: A | Discharge status: Home / Self Care | Payer: PPO | Source: Ambulatory Visit | Attending: Orthopedic Surgery | Admitting: Orthopedic Surgery

## 2017-12-06 DIAGNOSIS — G8929 Other chronic pain: Secondary | ICD-10-CM

## 2017-12-06 DIAGNOSIS — M5126 Other intervertebral disc displacement, lumbar region: Secondary | ICD-10-CM | POA: Diagnosis not present

## 2017-12-06 DIAGNOSIS — M545 Low back pain: Principal | ICD-10-CM

## 2017-12-06 MED ORDER — IOPAMIDOL (ISOVUE-M 200) INJECTION 41%
1.0000 mL | Freq: Once | INTRAMUSCULAR | Status: AC
  Administered 2017-12-06: 1 mL via EPIDURAL

## 2017-12-06 MED ORDER — METHYLPREDNISOLONE ACETATE 40 MG/ML INJ SUSP (RADIOLOG
120.0000 mg | Freq: Once | INTRAMUSCULAR | Status: AC
  Administered 2017-12-06: 120 mg via EPIDURAL

## 2017-12-06 NOTE — Discharge Instructions (Signed)

## 2017-12-07 DIAGNOSIS — Z0000 Encounter for general adult medical examination without abnormal findings: Secondary | ICD-10-CM | POA: Diagnosis not present

## 2017-12-07 DIAGNOSIS — Z1211 Encounter for screening for malignant neoplasm of colon: Secondary | ICD-10-CM | POA: Diagnosis not present

## 2017-12-07 DIAGNOSIS — Z1389 Encounter for screening for other disorder: Secondary | ICD-10-CM | POA: Diagnosis not present

## 2017-12-07 DIAGNOSIS — K219 Gastro-esophageal reflux disease without esophagitis: Secondary | ICD-10-CM | POA: Diagnosis not present

## 2017-12-07 DIAGNOSIS — E7800 Pure hypercholesterolemia, unspecified: Secondary | ICD-10-CM | POA: Diagnosis not present

## 2017-12-07 DIAGNOSIS — I10 Essential (primary) hypertension: Secondary | ICD-10-CM | POA: Diagnosis not present

## 2017-12-16 DIAGNOSIS — M545 Low back pain: Secondary | ICD-10-CM | POA: Diagnosis not present

## 2017-12-21 DIAGNOSIS — M25562 Pain in left knee: Secondary | ICD-10-CM | POA: Diagnosis not present

## 2017-12-29 DIAGNOSIS — M7122 Synovial cyst of popliteal space [Baker], left knee: Secondary | ICD-10-CM | POA: Diagnosis not present

## 2017-12-31 DIAGNOSIS — M7062 Trochanteric bursitis, left hip: Secondary | ICD-10-CM | POA: Diagnosis not present

## 2017-12-31 DIAGNOSIS — Z96642 Presence of left artificial hip joint: Secondary | ICD-10-CM | POA: Diagnosis not present

## 2018-01-12 DIAGNOSIS — M7122 Synovial cyst of popliteal space [Baker], left knee: Secondary | ICD-10-CM | POA: Diagnosis not present

## 2018-01-17 DIAGNOSIS — R634 Abnormal weight loss: Secondary | ICD-10-CM | POA: Diagnosis not present

## 2018-01-17 DIAGNOSIS — I951 Orthostatic hypotension: Secondary | ICD-10-CM | POA: Diagnosis not present

## 2018-01-17 DIAGNOSIS — R42 Dizziness and giddiness: Secondary | ICD-10-CM | POA: Diagnosis not present

## 2018-01-19 DIAGNOSIS — M1712 Unilateral primary osteoarthritis, left knee: Secondary | ICD-10-CM | POA: Diagnosis not present

## 2018-01-26 DIAGNOSIS — M1712 Unilateral primary osteoarthritis, left knee: Secondary | ICD-10-CM | POA: Diagnosis not present

## 2018-02-02 DIAGNOSIS — M1712 Unilateral primary osteoarthritis, left knee: Secondary | ICD-10-CM | POA: Diagnosis not present

## 2018-02-15 DIAGNOSIS — R42 Dizziness and giddiness: Secondary | ICD-10-CM | POA: Diagnosis not present

## 2018-02-15 DIAGNOSIS — R634 Abnormal weight loss: Secondary | ICD-10-CM | POA: Diagnosis not present

## 2018-03-03 DIAGNOSIS — M47817 Spondylosis without myelopathy or radiculopathy, lumbosacral region: Secondary | ICD-10-CM | POA: Diagnosis not present

## 2018-03-03 DIAGNOSIS — Z6828 Body mass index (BMI) 28.0-28.9, adult: Secondary | ICD-10-CM | POA: Diagnosis not present

## 2018-03-03 DIAGNOSIS — R030 Elevated blood-pressure reading, without diagnosis of hypertension: Secondary | ICD-10-CM | POA: Diagnosis not present

## 2018-03-24 DIAGNOSIS — M1712 Unilateral primary osteoarthritis, left knee: Secondary | ICD-10-CM | POA: Diagnosis not present

## 2018-04-03 IMAGING — XA Imaging study
1 series · 1 of 1 positions shown · non-contrast
Comparison: none

CLINICAL DATA: Left lower extremity radiculitis. Previous lumbar
fusion at L4-5. Displacement of the lumbar discs at L2-3 and L3-4

[Series 1: ortho standard · 1 of 1 slices shown]
[im 1/1]
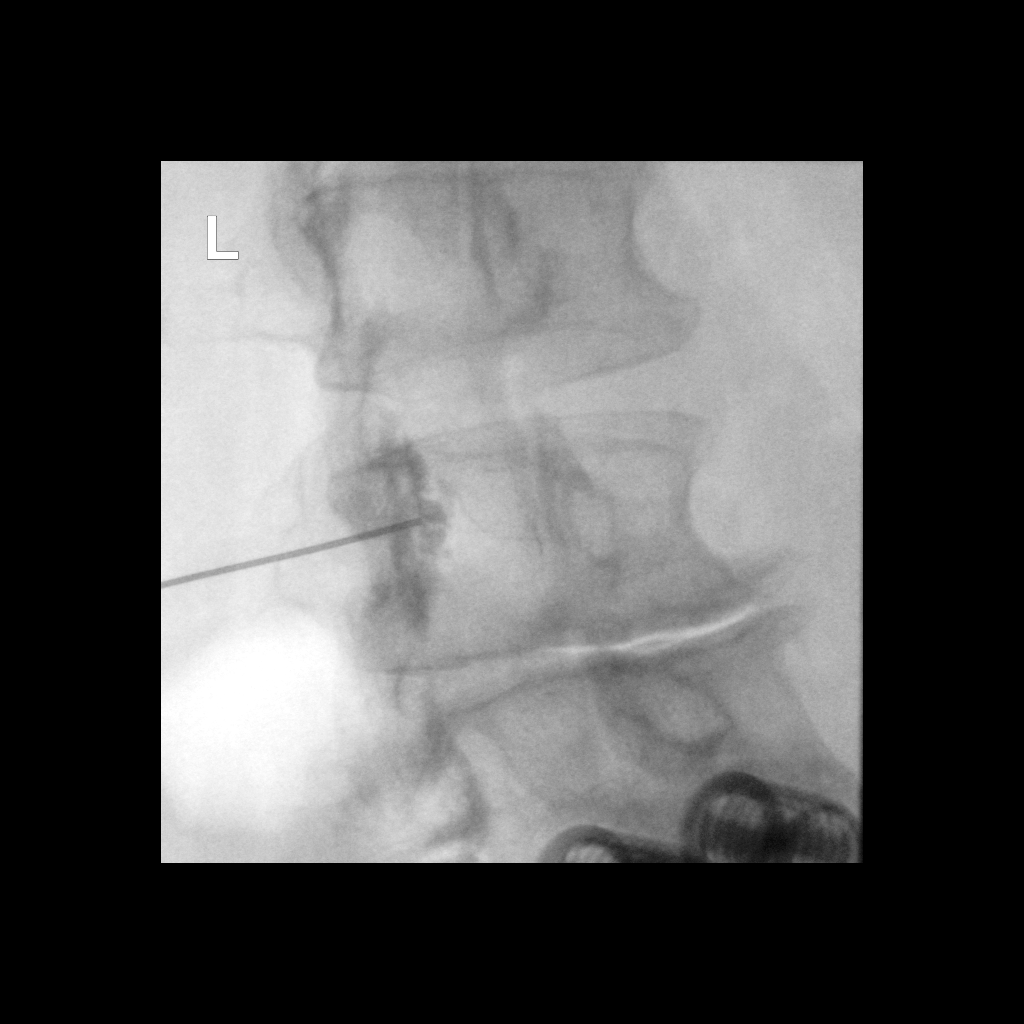

[1 of 1 positions shown; findings below may reference images not displayed]

FLUOROSCOPY TIME:  Radiation Exposure Index (as provided by the
fluoroscopic device): 20.92 uGy*m2

If the device does not provide the exposure index:Fluoroscopy Time:
19 seconds

Number of Acquired Images:  0

PROCEDURE:
The procedure, risks, benefits, and alternatives were explained to
the patient. Questions regarding the procedure were encouraged and
answered. The patient understands and consents to the procedure.

LUMBAR EPIDURAL INJECTION:

An interlaminar approach was performed on left at L2-3. The
overlying skin was cleansed and anesthetized. A 20 gauge epidural
needle was advanced using loss-of-resistance technique.

DIAGNOSTIC EPIDURAL INJECTION:

Injection of Isovue-M 200 shows a good epidural pattern with spread
above and below the level of needle placement, primarily on the left
no vascular opacification is seen.

THERAPEUTIC EPIDURAL INJECTION:

120 Mg of Depo-Medrol mixed with 3 mL 1% lidocaine were instilled.
The procedure was well-tolerated, and the patient was discharged
thirty minutes following the injection in good condition.

COMPLICATIONS:
None
IMPRESSION: Technically successful epidural injection on the left L2-3 # 1

## 2018-04-20 ENCOUNTER — Ambulatory Visit
Admission: RE | Admit: 2018-04-20 | Discharge: 2018-04-20 | Disposition: A | Discharge status: Home / Self Care | Payer: PPO | Source: Ambulatory Visit | Attending: Internal Medicine | Admitting: Internal Medicine

## 2018-04-20 ENCOUNTER — Other Ambulatory Visit: Payer: Self-pay | Admitting: Internal Medicine

## 2018-04-20 DIAGNOSIS — R1030 Lower abdominal pain, unspecified: Secondary | ICD-10-CM

## 2018-04-20 DIAGNOSIS — R140 Abdominal distension (gaseous): Secondary | ICD-10-CM

## 2018-04-21 ENCOUNTER — Other Ambulatory Visit: Payer: Self-pay | Admitting: Internal Medicine

## 2018-04-21 DIAGNOSIS — Z8619 Personal history of other infectious and parasitic diseases: Secondary | ICD-10-CM

## 2018-04-21 DIAGNOSIS — R748 Abnormal levels of other serum enzymes: Secondary | ICD-10-CM | POA: Diagnosis not present

## 2018-04-21 DIAGNOSIS — R1030 Lower abdominal pain, unspecified: Secondary | ICD-10-CM

## 2018-04-22 ENCOUNTER — Ambulatory Visit
Admission: RE | Admit: 2018-04-22 | Discharge: 2018-04-22 | Disposition: A | Discharge status: Home / Self Care | Payer: PPO | Source: Ambulatory Visit | Attending: Internal Medicine | Admitting: Internal Medicine

## 2018-04-22 DIAGNOSIS — N281 Cyst of kidney, acquired: Secondary | ICD-10-CM | POA: Diagnosis not present

## 2018-04-22 DIAGNOSIS — R748 Abnormal levels of other serum enzymes: Secondary | ICD-10-CM | POA: Diagnosis not present

## 2018-04-22 DIAGNOSIS — R1030 Lower abdominal pain, unspecified: Secondary | ICD-10-CM

## 2018-04-22 DIAGNOSIS — Z8619 Personal history of other infectious and parasitic diseases: Secondary | ICD-10-CM

## 2018-04-22 MED ORDER — IOPAMIDOL (ISOVUE-300) INJECTION 61%
100.0000 mL | Freq: Once | INTRAVENOUS | Status: AC | PRN
  Administered 2018-04-22: 100 mL via INTRAVENOUS

## 2018-04-28 DIAGNOSIS — R1030 Lower abdominal pain, unspecified: Secondary | ICD-10-CM | POA: Diagnosis not present

## 2018-04-28 DIAGNOSIS — D3001 Benign neoplasm of right kidney: Secondary | ICD-10-CM | POA: Diagnosis not present

## 2018-04-28 DIAGNOSIS — Z8619 Personal history of other infectious and parasitic diseases: Secondary | ICD-10-CM | POA: Diagnosis not present

## 2018-05-04 DIAGNOSIS — R194 Change in bowel habit: Secondary | ICD-10-CM | POA: Diagnosis not present

## 2018-05-04 DIAGNOSIS — R140 Abdominal distension (gaseous): Secondary | ICD-10-CM | POA: Diagnosis not present

## 2018-05-11 ENCOUNTER — Encounter (HOSPITAL_COMMUNITY): Payer: Self-pay

## 2018-05-11 NOTE — Pre-Procedure Instructions (Signed)
Surgical clearance from Dr Coralyn Mark 04/28/2018 on chart

## 2018-05-11 NOTE — Patient Instructions (Signed)
Your procedure is scheduled on: Monday, May 16, 2018   Surgery Time:  10:30AM-11:20AM   Report to [facility] [place]  Entrance    Report to admitting at 8:00 AM   Call this number if you have problems the morning of surgery [phone]   Do not eat food or drink liquids :After Midnight.   Do NOT smoke after Midnight   Take these medicines the morning of surgery with A SIP OF WATER: Duloxetine                               You may not have any metal on your body including hair pins, jewelry, and body piercings             Do not wear make-up, lotions, powders, perfumes/cologne, or deodorant             Do not wear nail polish.  Do not shave  48 hours prior to surgery.                Do not bring valuables to the hospital. [place].   Contacts, dentures or bridgework may not be worn into surgery.   Leave suitcase in the car. After surgery it may be brought to your room.   Special Instructions: Bring a copy of your healthcare power of attorney and living will documents         the day of surgery if you haven't scanned them in before.              Please read over the following fact sheets you were given:  [facility] - Preparing for Surgery Before surgery, you can play an important role.  Because skin is not sterile, your skin needs to be as free of germs as possible.  You can reduce the number of germs on your skin by washing with CHG (chlorahexidine gluconate) soap before surgery.  CHG is an antiseptic cleaner which kills germs and bonds with the skin to continue killing germs even after washing. Please DO NOT use if you have an allergy to CHG or antibacterial soaps.  If your skin becomes reddened/irritated stop using the CHG and inform your nurse when you arrive at Short Stay. Do not shave (including legs and underarms) for at least 48 hours prior to the first CHG shower.  You may shave your face/neck.  Please follow  these instructions carefully:  1.  Shower with CHG Soap the night before surgery and the  morning of surgery.  2.  If you choose to wash your hair, wash your hair first as usual with your normal  shampoo.  3.  After you shampoo, rinse your hair and body thoroughly to remove the shampoo.                             4.  Use CHG as you would any other liquid soap.  You can apply chg directly to the skin and wash.  Gently with a scrungie or clean washcloth.  5.  Apply the CHG Soap to your body ONLY FROM THE NECK DOWN.   Do   not use on face/ open  Wound or open sores. Avoid contact with eyes, ears mouth and   genitals (private parts).                       Wash face,  Genitals (private parts) with your normal soap.             6.  Wash thoroughly, paying special attention to the area where your    surgery  will be performed.  7.  Thoroughly rinse your body with warm water from the neck down.  8.  DO NOT shower/wash with your normal soap after using and rinsing off the CHG Soap.                9.  Pat yourself dry with a clean towel.            10.  Wear clean pajamas.            11.  Place clean sheets on your bed the night of your first shower and do not  sleep with pets. Day of Surgery : Do not apply any lotions/deodorants the morning of surgery.  Please wear clean clothes to the hospital/surgery center.  FAILURE TO FOLLOW THESE INSTRUCTIONS MAY RESULT IN THE CANCELLATION OF YOUR SURGERY  PATIENT SIGNATURE_________________________________  NURSE SIGNATURE__________________________________  ________________________________________________________________________   Christine [name]  An incentive spirometer is a tool that can help keep your lungs clear and active. This tool measures how well you are filling your lungs with each breath. Taking long deep breaths may help reverse or decrease the chance of developing breathing (pulmonary) problems (especially  infection) following:  A long period of time when you are unable to move or be active. BEFORE THE PROCEDURE   If the spirometer includes an indicator to show your best effort, your nurse or respiratory therapist will set it to a desired goal.  If possible, sit up straight or lean slightly forward. Try not to slouch.  Hold the incentive spirometer in an upright position. INSTRUCTIONS FOR USE  1. Sit on the edge of your bed if possible, or sit up as far as you can in bed or on a chair. 2. Hold the incentive spirometer in an upright position. 3. Breathe out normally. 4. Place the mouthpiece in your mouth and seal your lips tightly around it. 5. Breathe in slowly and as deeply as possible, raising the piston or the ball toward the top of the column. 6. Hold your breath for 3-5 seconds or for as long as possible. Allow the piston or ball to fall to the bottom of the column. 7. Remove the mouthpiece from your mouth and breathe out normally. 8. Rest for a few seconds and repeat Steps 1 through 7 at least 10 times every 1-2 hours when you are awake. Take your time and take a few normal breaths between deep breaths. 9. The spirometer may include an indicator to show your best effort. Use the indicator as a goal to work toward during each repetition. 10. After each set of 10 deep breaths, practice coughing to be sure your lungs are clear. If you have an incision (the cut made at the time of surgery), support your incision when coughing by placing a pillow or rolled up towels firmly against it. Once you are able to get out of bed, walk around indoors and cough well. You may stop using the incentive spirometer when instructed by your caregiver.  RISKS AND COMPLICATIONS  Take your time  so you do not get dizzy or light-headed.  If you are in pain, you may need to take or ask for pain medication before doing incentive spirometry. It is harder to take a deep breath if you are having pain. AFTER  USE  Rest and breathe slowly and easily.  It can be helpful to keep track of a log of your progress. Your caregiver can provide you with a simple table to help with this. If you are using the spirometer at home, follow these instructions: [place] IF:   You are having difficultly using the spirometer.  You have trouble using the spirometer as often as instructed.  Your pain medication is not giving enough relief while using the spirometer.  You develop fever of 100.5 F (38.1 C) or higher. SEEK IMMEDIATE MEDICAL CARE IF:   You cough up bloody sputum that had not been present before.  You develop fever of 102 F (38.9 C) or greater.  You develop worsening pain at or near the incision site. MAKE SURE YOU:   Understand these instructions.  Will watch your condition.  Will get help right away if you are not doing well or get worse. Document Released: 03/29/2007 Document Revised: 02/08/2012 Document Reviewed: 05/30/2007 Exit[employer] Patient Information 2014 Exit[employer], [place].   ________________________________________________________________________  WHAT IS A BLOOD TRANSFUSION? Blood Transfusion Information  A transfusion is the replacement of blood or some of its parts. Blood is made up of multiple cells which provide different functions.  Red blood cells carry oxygen and are used for blood loss replacement.  White blood cells fight against infection.  Platelets control bleeding.  Plasma helps clot blood.  Other blood products are available for specialized needs, such as hemophilia or other clotting disorders. BEFORE THE TRANSFUSION  Who gives blood for transfusions?   Healthy volunteers who are fully evaluated to make sure their blood is safe. This is blood bank blood. Transfusion therapy is the safest it has ever been in the practice of medicine. Before blood is taken from a donor, a complete history is taken to make sure that person has no history of diseases  nor engages in risky social behavior (examples are intravenous drug use or sexual activity with multiple partners). The donor's travel history is screened to minimize risk of transmitting infections, such as malaria. The donated blood is tested for signs of infectious diseases, such as HIV and hepatitis. The blood is then tested to be sure it is compatible with you in order to minimize the chance of a transfusion reaction. If you or a relative donates blood, this is often done in anticipation of surgery and is not appropriate for emergency situations. It takes many days to process the donated blood. RISKS AND COMPLICATIONS Although transfusion therapy is very safe and saves many lives, the main dangers of transfusion include:   Getting an infectious disease.  Developing a transfusion reaction. This is an allergic reaction to something in the blood you were given. Every precaution is taken to prevent this. The decision to have a blood transfusion has been considered carefully by your caregiver before blood is given. Blood is not given unless the benefits outweigh the risks. AFTER THE TRANSFUSION  Right after receiving a blood transfusion, you will usually feel much better and more energetic. This is especially true if your red blood cells have gotten low (anemic). The transfusion raises the level of the red blood cells which carry oxygen, and this usually causes an energy increase.  The  nurse administering the transfusion will monitor you carefully for complications. HOME CARE INSTRUCTIONS  No special instructions are needed after a transfusion. You may find your energy is better. Speak with your caregiver about any limitations on activity for underlying diseases you may have. SEEK MEDICAL CARE IF:   Your condition is not improving after your transfusion.  You develop redness or irritation at the intravenous (IV) site. SEEK IMMEDIATE MEDICAL CARE IF:  Any of the following symptoms occur over the  next 12 hours:  Shaking chills.  You have a temperature by mouth above 102 F (38.9 C), not controlled by medicine.  Chest, back, or muscle pain.  People around you feel you are not acting correctly or are confused.  Shortness of breath or difficulty breathing.  Dizziness and fainting.  You get a rash or develop hives.  You have a decrease in urine output.  Your urine turns a dark color or changes to pink, red, or brown. Any of the following symptoms occur over the next 10 days:  You have a temperature by mouth above 102 F (38.9 C), not controlled by medicine.  Shortness of breath.  Weakness after normal activity.  The white part of the eye turns yellow (jaundice).  You have a decrease in the amount of urine or are urinating less often.  Your urine turns a dark color or changes to pink, red, or brown. Document Released: 11/13/2000 Document Revised: 02/08/2012 Document Reviewed: 07/02/2008 [facility] Patient Information 2014 [place], [place].  _______________________________________________________________________

## 2018-05-12 NOTE — H&P (Signed)
TOTAL KNEE ADMISSION H&P  Patient is being admitted for left total knee arthroplasty.  Subjective:  Chief Complaint:left knee pain.  HPI: Christine [name], 67 y.o. female, has a history of pain and functional disability in the left knee due to arthritis and has failed non-surgical conservative treatments for greater than 12 weeks to includeNSAID's and/or analgesics, corticosteriod injections, flexibility and strengthening excercises, use of assistive devices and activity modification.  Onset of symptoms was gradual, starting 1 year ago with rapidlly worsening course since that time. The patient noted no past surgery on the left knee(s).  Patient currently rates pain in the left knee(s) at 8 out of 10 with activity. Patient has night pain, worsening of pain with activity and weight bearing, pain that interferes with activities of daily living, pain with passive range of motion, crepitus and joint swelling.  Patient has evidence of periarticular osteophytes and joint space narrowing by imaging studies.  There is no active infection.  Patient Active Problem List   Diagnosis Date Noted  . Tick bite 03/10/2016  . Post-operative state 12/30/2015  . Hammertoe 11/11/2015  . 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 OA (osteoarthritis) of hip 01/23/2015  . Cough 10/07/2014   Past Medical History:  Diagnosis Date  . Anemia 2012   hx of   . Arthritis    "q joint in my body; it's eating me up" (07/27/2012)  . Blood transfusion 1970's   bleeding  after a fertility exploratory surgery   . Bronchitis    "once"  . Chronic lower back pain 07/27/2012   "they said I need another fusion right now"  . Complication of anesthesia 1990's   "didn't have me totally asleep when they put ETT in; very traumatic"  . Depression   . GERD (gastroesophageal reflux disease)   . High cholesterol   . OA (osteoarthritis)   . PONV (postoperative nausea and vomiting)   . Urinary tract infection    hx of     Past Surgical History:  Procedure Laterality Date  . APPENDECTOMY  ` 1964  . BACK SURGERY    . BREAST SURGERY     "total of 4 tumors removed; both breasts"  . CESAREAN SECTION  1987  . CHOLECYSTECTOMY  ~ 1990  . DIAGNOSTIC LAPAROSCOPY  1970's   "checking fertility; checking everything out"  . DILATION AND CURETTAGE OF UTERUS  ~ 1989  . HAMMER TOE SURGERY     right; "twice"  . HAND SURGERY  ~ 1999   left thrumb  joint; "spur removal; rerouting of tendon"  . HERNIA REPAIR  9924   umbilical  . NASAL SEPTUM SURGERY    . POSTERIOR FUSION LUMBAR SPINE  ~ 2002  . TONSILLECTOMY AND ADENOIDECTOMY     "as a child"  . TOTAL HIP ARTHROPLASTY  07/27/2012   left  . TOTAL HIP ARTHROPLASTY  07/27/2012   Procedure: TOTAL HIP ARTHROPLASTY;  Surgeon: Ninetta Lights, MD;  Location: [place];  Service: Orthopedics;  Laterality: Left;  Left TOTAL HIP   . TOTAL HIP REVISION Left 01/23/2015   Procedure: LEFT HIP ACETABULAR  REVISION;  Surgeon: Gearlean Alf, MD;  Location: [facility];  Service: Orthopedics;  Laterality: Left;  . TOTAL KNEE ARTHROPLASTY  04/13/2012   Procedure: TOTAL KNEE ARTHROPLASTY;  Surgeon: Ninetta Lights, MD;  Location: [place];  Service: Orthopedics;  Laterality: Right;  DR MURPHY WANTS 90 MINUTES FOR THIS CASE  . TUBAL LIGATION  ~ 1990  .  VAGINAL HYSTERECTOMY  1989       Current Outpatient Medications  Medication Sig Dispense Refill Last Dose  . Cholecalciferol (VITAMIN D) 2000 units tablet Take 2,000 Units by mouth daily.     Marland Kitchen dicyclomine (BENTYL) 20 MG tablet Take 1 tablet by mouth 4 (four) times daily as needed for spasms.   0   . DULoxetine (CYMBALTA) 60 MG capsule Take 60 mg by mouth every morning.    Taking  . estradiol (ESTRACE) 1 MG tablet Take 1 mg by mouth daily.     Marland Kitchen etodolac (LODINE) 500 MG tablet Take 500 mg by mouth 2 (two) times daily.    Taking   Allergies  Allergen Reactions  . Other Nausea Only    "most pain medications cause really bad nausea."   .  Amoxicillin Nausea Only  . Codeine Nausea Only  . Dilaudid [Hydromorphone Hcl] Itching  . Keflex [Cephalexin] Itching  . Nickel Itching and Rash  . Sulfa Antibiotics Rash    Social History   Tobacco Use  . Smoking status: Former Smoker    Packs/day: 0.12    Years: 3.00    Pack years: 0.36    Types: Cigarettes    Last attempt to quit: 11/30/1973    Years since quitting: 44.4  . Smokeless tobacco: Never Used  Substance Use Topics  . Alcohol use: Yes    Comment: rarely     Family History  Problem Relation Age of Onset  . Anesthesia problems Neg Hx      Review of Systems  Constitutional: Negative.   HENT: Negative.   Eyes: Negative.   Respiratory: Negative.   Cardiovascular: Negative.   Gastrointestinal: Negative.   Genitourinary: Negative.   Musculoskeletal: Positive for joint pain and myalgias. Negative for back pain, falls and neck pain.  Skin: Negative.   Neurological: Negative.   Endo/Heme/Allergies: Negative.   Psychiatric/Behavioral: Negative for depression, hallucinations, memory loss, substance abuse and suicidal ideas. The patient is nervous/anxious. The patient does not have insomnia.     Objective:  Physical Exam  Constitutional: She is oriented to person, place, and time. She appears well-developed. No distress.  Overweight  HENT:  Head: Normocephalic and atraumatic.  Right Ear: External ear normal.  Left Ear: External ear normal.  Nose: Nose normal.  Mouth/Throat: Oropharynx is clear and moist.  Eyes: Conjunctivae and EOM are normal.  Neck: Normal range of motion. Neck supple.  Cardiovascular: Normal rate, regular rhythm, normal heart sounds and intact distal pulses.  No murmur heard. Respiratory: Effort normal and breath sounds normal. No respiratory distress. She has no wheezes.  GI: Soft. Bowel sounds are normal. She exhibits no distension. There is no tenderness.  Musculoskeletal:  Musculoskeletal Right Knee Exam:  No effusion.  Range of  motion is 0-125 degrees.  No crepitus on range of motion of the knee.  No medial or lateral joint line tenderness.  Stable knee.  Left Knee Exam:  Moderate effusion. No warmth about the knee. Range of motion is 0-125 degrees.  No crepitus on range of motion of the knee.  Significant medial tenderness. No lateral joint line tenderness.  Stable knee.   Left Hip Exam: ROM: is normal without discomfort.  There is no tenderness over the greater trochanter.  Neurological: She is alert and oriented to person, place, and time. She has normal strength. No sensory deficit.  Skin: No rash noted. She is not diaphoretic. No erythema.  Psychiatric: She has a normal mood and affect. Her  behavior is normal.    Ht: 5 ft 5 in  Wt: 171 lbs  BMI: 28.5  BP: 122/84 sitting L arm  Pulse: 72 bpm  Pain Scale: 8   Imaging Review Plain radiographs demonstrate severe degenerative joint disease of the left knee(s). The overall alignment ismild varus. The bone quality appears to be good for age and reported activity level.   Preoperative templating of the joint replacement has been completed, documented, and submitted to the Operating Room personnel in order to optimize intra-operative equipment management.   Anticipated LOS equal to or greater than 2 midnights due to - Age 45 and older with one or more of the following:  - Obesity  - Expected need for hospital services (PT, OT, Nursing) required for safe  discharge  - Anticipated need for postoperative skilled nursing care or inpatient rehab  - Active co-morbidities: None OR   - Unanticipated findings during/Post Surgery: None  - Patient is a high risk of re-admission due to: None     Assessment/Plan:  End stage primary osteoarthritis, left knee   The patient history, physical examination, clinical judgment of the provider and imaging studies are consistent with end stage degenerative joint disease of the left knee(s) and total knee  arthroplasty is deemed medically necessary. The treatment options including medical management, injection therapy arthroscopy and arthroplasty were discussed at length. The risks and benefits of total knee arthroplasty were presented and reviewed. The risks due to aseptic loosening, infection, stiffness, patella tracking problems, thromboembolic complications and other imponderables were discussed. The patient acknowledged the explanation, agreed to proceed with the plan and consent was signed. Patient is being admitted for inpatient treatment for surgery, pain control, PT, OT, prophylactic antibiotics, VTE prophylaxis, progressive ambulation and ADL's and discharge planning. The patient is planning to be discharged home   Therapy Plans: outpatient at [facility] Disposition: Home with husband Planned DVT prophylaxis: aspirin 325mg  BID DME needed: none needed PCP: Dr. Coralyn Mark Other: TXA IV Will need anti-emetic Rx post op   Ardeen Jourdain PA-C

## 2018-05-13 ENCOUNTER — Encounter (HOSPITAL_COMMUNITY)
Admission: RE | Admit: 2018-05-13 | Discharge: 2018-05-13 | Disposition: A | Discharge status: Home / Self Care | Payer: PPO | Source: Ambulatory Visit | Attending: Orthopedic Surgery | Admitting: Orthopedic Surgery

## 2018-05-13 ENCOUNTER — Encounter (HOSPITAL_COMMUNITY): Payer: Self-pay

## 2018-05-13 ENCOUNTER — Other Ambulatory Visit: Payer: Self-pay

## 2018-05-13 DIAGNOSIS — Z96651 Presence of right artificial knee joint: Secondary | ICD-10-CM | POA: Diagnosis present

## 2018-05-13 DIAGNOSIS — G8918 Other acute postprocedural pain: Secondary | ICD-10-CM | POA: Diagnosis not present

## 2018-05-13 DIAGNOSIS — Z880 Allergy status to penicillin: Secondary | ICD-10-CM | POA: Diagnosis not present

## 2018-05-13 DIAGNOSIS — Z79890 Hormone replacement therapy (postmenopausal): Secondary | ICD-10-CM | POA: Diagnosis not present

## 2018-05-13 DIAGNOSIS — Z882 Allergy status to sulfonamides status: Secondary | ICD-10-CM | POA: Diagnosis not present

## 2018-05-13 DIAGNOSIS — Z01812 Encounter for preprocedural laboratory examination: Secondary | ICD-10-CM | POA: Diagnosis not present

## 2018-05-13 DIAGNOSIS — Z91048 Other nonmedicinal substance allergy status: Secondary | ICD-10-CM | POA: Diagnosis not present

## 2018-05-13 DIAGNOSIS — K589 Irritable bowel syndrome without diarrhea: Secondary | ICD-10-CM | POA: Diagnosis present

## 2018-05-13 DIAGNOSIS — Z885 Allergy status to narcotic agent status: Secondary | ICD-10-CM | POA: Diagnosis not present

## 2018-05-13 DIAGNOSIS — M25562 Pain in left knee: Secondary | ICD-10-CM | POA: Diagnosis present

## 2018-05-13 DIAGNOSIS — E669 Obesity, unspecified: Secondary | ICD-10-CM | POA: Diagnosis present

## 2018-05-13 DIAGNOSIS — Z6828 Body mass index (BMI) 28.0-28.9, adult: Secondary | ICD-10-CM | POA: Diagnosis not present

## 2018-05-13 DIAGNOSIS — Z791 Long term (current) use of non-steroidal anti-inflammatories (NSAID): Secondary | ICD-10-CM | POA: Diagnosis not present

## 2018-05-13 DIAGNOSIS — Z881 Allergy status to other antibiotic agents status: Secondary | ICD-10-CM | POA: Diagnosis not present

## 2018-05-13 DIAGNOSIS — M1712 Unilateral primary osteoarthritis, left knee: Secondary | ICD-10-CM | POA: Diagnosis present

## 2018-05-13 DIAGNOSIS — Z96642 Presence of left artificial hip joint: Secondary | ICD-10-CM | POA: Diagnosis present

## 2018-05-13 DIAGNOSIS — Z87891 Personal history of nicotine dependence: Secondary | ICD-10-CM | POA: Diagnosis not present

## 2018-05-13 DIAGNOSIS — F329 Major depressive disorder, single episode, unspecified: Secondary | ICD-10-CM | POA: Diagnosis present

## 2018-05-13 HISTORY — DX: Irritable bowel syndrome, unspecified: K58.9

## 2018-05-13 HISTORY — DX: Unspecified osteoarthritis, unspecified site: M19.90

## 2018-05-13 HISTORY — DX: Other reaction to spinal and lumbar puncture: G97.1

## 2018-05-13 LAB — COMPREHENSIVE METABOLIC PANEL
ALT: 17 U/L (ref 14–54)
AST: 20 U/L (ref 15–41)
Albumin: 3.8 g/dL (ref 3.5–5.0)
Alkaline Phosphatase: 105 U/L (ref 38–126)
Anion gap: 7 (ref 5–15)
BUN: 11 mg/dL (ref 6–20)
CO2: 28 mmol/L (ref 22–32)
Calcium: 9.3 mg/dL (ref 8.9–10.3)
Chloride: 107 mmol/L (ref 101–111)
Creatinine, Ser: 0.71 mg/dL (ref 0.44–1.00)
GFR calc Af Amer: >60 mL/min (ref >60)
GFR calc non Af Amer: >60 mL/min (ref >60)
Glucose, Bld: 92 mg/dL (ref 65–99)
Potassium: 4.4 mmol/L (ref 3.5–5.1)
Sodium: 142 mmol/L (ref 135–145)
Total Bilirubin: 0.4 mg/dL (ref 0.3–1.2)
Total Protein: 7.1 g/dL (ref 6.5–8.1)

## 2018-05-13 LAB — SURGICAL PCR SCREEN
MRSA, PCR: NEGATIVE
Staphylococcus aureus: NEGATIVE

## 2018-05-13 LAB — CBC
HCT: 37.8 % (ref 36.0–46.0)
Hemoglobin: 12.5 g/dL (ref 12.0–15.0)
MCH: 30.9 pg (ref 26.0–34.0)
MCHC: 33.1 g/dL (ref 30.0–36.0)
MCV: 93.6 fL (ref 78.0–100.0)
Platelets: 285 10*3/uL (ref 150–400)
RBC: 4.04 MIL/uL (ref 3.87–5.11)
RDW: 12.3 % (ref 11.5–15.5)
WBC: 6.6 10*3/uL (ref 4.0–10.5)

## 2018-05-13 LAB — PROTIME-INR
INR: 0.98
Prothrombin Time: 12.9 seconds (ref 11.4–15.2)

## 2018-05-13 LAB — URINALYSIS, ROUTINE W REFLEX MICROSCOPIC
Bilirubin Urine: NEGATIVE
Glucose, UA: NEGATIVE mg/dL
Hgb urine dipstick: NEGATIVE
Ketones, ur: NEGATIVE mg/dL
Leukocytes, UA: NEGATIVE
Nitrite: NEGATIVE
Protein, ur: NEGATIVE mg/dL
Specific Gravity, Urine: 1.017 (ref 1.005–1.030)
pH: 6 (ref 5.0–8.0)

## 2018-05-13 LAB — APTT: aPTT: 30 seconds (ref 24–36)

## 2018-05-13 NOTE — Pre-Procedure Instructions (Signed)
PCR and T&S from 05/13/2018 still pending, Megan RN in short stay made aware.

## 2018-05-15 MED ORDER — TRANEXAMIC ACID 1000 MG/10ML IV SOLN
1000.0000 mg | INTRAVENOUS | Status: AC
  Administered 2018-05-16: 1000 mg via INTRAVENOUS
  Filled 2018-05-15: qty 1100

## 2018-05-15 MED ORDER — BUPIVACAINE LIPOSOME 1.3 % IJ SUSP
20.0000 mL | Freq: Once | INTRAMUSCULAR | Status: DC
  Filled 2018-05-15: qty 20

## 2018-05-15 NOTE — Anesthesia Preprocedure Evaluation (Addendum)
Anesthesia Evaluation  Patient identified by MRN, date of birth, ID band Patient awake    Reviewed: Allergy & Precautions, NPO status , Patient's Chart, lab work & pertinent test results  History of Anesthesia Complications (+) PONV, POST - OP SPINAL HEADACHE and history of anesthetic complications  Airway Mallampati: I  TM Distance: >3 FB Neck ROM: Full    Dental no notable dental hx.    Pulmonary former smoker,    Pulmonary exam normal breath sounds clear to auscultation       Cardiovascular Normal cardiovascular exam Rhythm:Regular Rate:Normal     Neuro/Psych  Headaches, Depression    GI/Hepatic GERD  ,  Endo/Other    Renal/GU      Musculoskeletal  (+) Arthritis , Osteoarthritis,    Abdominal   Peds negative pediatric ROS (+)  Hematology  (+) anemia ,   Anesthesia Other Findings Complication of anesthesia 1990's   "didn't have me totally asleep when they put ETT in; very traumatic"    Reproductive/Obstetrics                            Anesthesia Physical  Anesthesia Plan  ASA: II  Anesthesia Plan: General   Post-op Pain Management:  Regional for Post-op pain   Induction: Intravenous  PONV Risk Score and Plan: 3 and Ondansetron, Dexamethasone, Treatment may vary due to age or medical condition, Propofol infusion and Scopolamine patch - Pre-op  Airway Management Planned: LMA and Oral ETT  Additional Equipment:   Intra-op Plan:   Post-operative Plan:   Informed Consent: I have reviewed the patients History and Physical, chart, labs and discussed the procedure including the risks, benefits and alternatives for the proposed anesthesia with the patient or authorized representative who has indicated his/her understanding and acceptance.   Dental advisory given  Plan Discussed with: CRNA, Anesthesiologist and Surgeon  Anesthesia Plan Comments: (H/o post dural puncture HA.  Also awareness of intubation after induction of GA.  .)      Anesthesia Quick Evaluation

## 2018-05-16 ENCOUNTER — Other Ambulatory Visit: Payer: Self-pay

## 2018-05-16 ENCOUNTER — Inpatient Hospital Stay (HOSPITAL_COMMUNITY): Payer: PPO | Admitting: Anesthesiology

## 2018-05-16 ENCOUNTER — Inpatient Hospital Stay (HOSPITAL_COMMUNITY)
Admission: RE | Admit: 2018-05-16 | Discharge: 2018-05-17 | DRG: 470 | Disposition: A | Discharge status: Home / Self Care | Payer: PPO | Source: Ambulatory Visit | Attending: Orthopedic Surgery | Admitting: Orthopedic Surgery

## 2018-05-16 ENCOUNTER — Encounter (HOSPITAL_COMMUNITY): Payer: Self-pay | Admitting: Emergency Medicine

## 2018-05-16 ENCOUNTER — Encounter (HOSPITAL_COMMUNITY)
Admission: RE | Disposition: A | Discharge status: Home / Self Care | Payer: Self-pay | Source: Ambulatory Visit | Attending: Orthopedic Surgery

## 2018-05-16 DIAGNOSIS — Z79890 Hormone replacement therapy (postmenopausal): Secondary | ICD-10-CM

## 2018-05-16 DIAGNOSIS — E669 Obesity, unspecified: Secondary | ICD-10-CM | POA: Diagnosis present

## 2018-05-16 DIAGNOSIS — K589 Irritable bowel syndrome without diarrhea: Secondary | ICD-10-CM | POA: Diagnosis present

## 2018-05-16 DIAGNOSIS — Z96642 Presence of left artificial hip joint: Secondary | ICD-10-CM | POA: Diagnosis present

## 2018-05-16 DIAGNOSIS — Z882 Allergy status to sulfonamides status: Secondary | ICD-10-CM

## 2018-05-16 DIAGNOSIS — Z01812 Encounter for preprocedural laboratory examination: Secondary | ICD-10-CM | POA: Diagnosis not present

## 2018-05-16 DIAGNOSIS — F329 Major depressive disorder, single episode, unspecified: Secondary | ICD-10-CM | POA: Diagnosis present

## 2018-05-16 DIAGNOSIS — Z6828 Body mass index (BMI) 28.0-28.9, adult: Secondary | ICD-10-CM

## 2018-05-16 DIAGNOSIS — Z87891 Personal history of nicotine dependence: Secondary | ICD-10-CM

## 2018-05-16 DIAGNOSIS — M1712 Unilateral primary osteoarthritis, left knee: Principal | ICD-10-CM | POA: Diagnosis present

## 2018-05-16 DIAGNOSIS — M1710 Unilateral primary osteoarthritis, unspecified knee: Secondary | ICD-10-CM | POA: Diagnosis present

## 2018-05-16 DIAGNOSIS — Z885 Allergy status to narcotic agent status: Secondary | ICD-10-CM

## 2018-05-16 DIAGNOSIS — Z881 Allergy status to other antibiotic agents status: Secondary | ICD-10-CM | POA: Diagnosis not present

## 2018-05-16 DIAGNOSIS — Z91048 Other nonmedicinal substance allergy status: Secondary | ICD-10-CM | POA: Diagnosis not present

## 2018-05-16 DIAGNOSIS — Z96651 Presence of right artificial knee joint: Secondary | ICD-10-CM | POA: Diagnosis present

## 2018-05-16 DIAGNOSIS — Z880 Allergy status to penicillin: Secondary | ICD-10-CM | POA: Diagnosis not present

## 2018-05-16 DIAGNOSIS — Z791 Long term (current) use of non-steroidal anti-inflammatories (NSAID): Secondary | ICD-10-CM | POA: Diagnosis not present

## 2018-05-16 DIAGNOSIS — M25562 Pain in left knee: Secondary | ICD-10-CM | POA: Diagnosis present

## 2018-05-16 DIAGNOSIS — M179 Osteoarthritis of knee, unspecified: Secondary | ICD-10-CM | POA: Diagnosis present

## 2018-05-16 HISTORY — PX: TOTAL KNEE ARTHROPLASTY: SHX125

## 2018-05-16 LAB — TYPE AND SCREEN
ABO/RH(D): B POS
Antibody Screen: NEGATIVE

## 2018-05-16 SURGERY — ARTHROPLASTY, KNEE, TOTAL
Anesthesia: General | Site: Knee | Laterality: Left

## 2018-05-16 MED ORDER — MENTHOL 3 MG MT LOZG: 1.0000 | LOZENGE | OROMUCOSAL | Status: DC | PRN

## 2018-05-16 MED ORDER — VANCOMYCIN HCL IN DEXTROSE 1-5 GM/200ML-% IV SOLN
1000.0000 mg | INTRAVENOUS | Status: AC
  Administered 2018-05-16: 1000 mg via INTRAVENOUS
  Filled 2018-05-16: qty 200

## 2018-05-16 MED ORDER — PHENOL 1.4 % MT LIQD: 1.0000 | OROMUCOSAL | Status: DC | PRN

## 2018-05-16 MED ORDER — ACETAMINOPHEN 160 MG/5ML PO SOLN: 325.0000 mg | ORAL | Status: DC | PRN

## 2018-05-16 MED ORDER — DIPHENHYDRAMINE HCL 12.5 MG/5ML PO ELIX
12.5000 mg | ORAL_SOLUTION | ORAL | Status: DC | PRN
  Administered 2018-05-16 – 2018-05-17 (×4): 25 mg via ORAL
  Filled 2018-05-16 (×6): qty 10

## 2018-05-16 MED ORDER — MIDAZOLAM HCL 2 MG/2ML IJ SOLN
INTRAMUSCULAR | Status: AC
  Filled 2018-05-16: qty 2

## 2018-05-16 MED ORDER — LIDOCAINE 2% (20 MG/ML) 5 ML SYRINGE
INTRAMUSCULAR | Status: DC | PRN
  Administered 2018-05-16: 60 mg via INTRAVENOUS

## 2018-05-16 MED ORDER — LACTATED RINGERS IV SOLN: INTRAVENOUS | Status: DC

## 2018-05-16 MED ORDER — PROPOFOL 10 MG/ML IV BOLUS
INTRAVENOUS | Status: DC | PRN
  Administered 2018-05-16: 200 mg via INTRAVENOUS

## 2018-05-16 MED ORDER — METOCLOPRAMIDE HCL 5 MG PO TABS: 5.0000 mg | ORAL_TABLET | Freq: Three times a day (TID) | ORAL | Status: DC | PRN

## 2018-05-16 MED ORDER — DEXAMETHASONE SODIUM PHOSPHATE 10 MG/ML IJ SOLN: 8.0000 mg | Freq: Once | INTRAMUSCULAR | Status: DC

## 2018-05-16 MED ORDER — ASPIRIN EC 325 MG PO TBEC
325.0000 mg | DELAYED_RELEASE_TABLET | Freq: Two times a day (BID) | ORAL | Status: DC
  Administered 2018-05-17: 325 mg via ORAL
  Filled 2018-05-16: qty 1

## 2018-05-16 MED ORDER — SCOPOLAMINE 1 MG/3DAYS TD PT72
1.0000 | MEDICATED_PATCH | TRANSDERMAL | Status: DC
  Administered 2018-05-16: 1.5 mg via TRANSDERMAL
  Filled 2018-05-16: qty 1

## 2018-05-16 MED ORDER — DULOXETINE HCL 60 MG PO CPEP
60.0000 mg | ORAL_CAPSULE | Freq: Every morning | ORAL | Status: DC
  Administered 2018-05-17: 60 mg via ORAL
  Filled 2018-05-16: qty 1

## 2018-05-16 MED ORDER — FENTANYL CITRATE (PF) 250 MCG/5ML IJ SOLN
INTRAMUSCULAR | Status: DC | PRN
  Administered 2018-05-16 (×2): 50 ug via INTRAVENOUS
  Administered 2018-05-16: 100 ug via INTRAVENOUS
  Administered 2018-05-16: 50 ug via INTRAVENOUS

## 2018-05-16 MED ORDER — ACETAMINOPHEN 10 MG/ML IV SOLN
1000.0000 mg | Freq: Once | INTRAVENOUS | Status: AC
  Administered 2018-05-16: 1000 mg via INTRAVENOUS
  Filled 2018-05-16: qty 100

## 2018-05-16 MED ORDER — POLYETHYLENE GLYCOL 3350 17 G PO PACK: 17.0000 g | PACK | Freq: Every day | ORAL | Status: DC | PRN

## 2018-05-16 MED ORDER — SODIUM CHLORIDE 0.9 % IJ SOLN
INTRAMUSCULAR | Status: AC
  Filled 2018-05-16: qty 50

## 2018-05-16 MED ORDER — FENTANYL CITRATE (PF) 100 MCG/2ML IJ SOLN
25.0000 ug | INTRAMUSCULAR | Status: DC | PRN
  Administered 2018-05-16 (×4): 50 ug via INTRAVENOUS

## 2018-05-16 MED ORDER — ONDANSETRON HCL 4 MG PO TABS: 4.0000 mg | ORAL_TABLET | Freq: Four times a day (QID) | ORAL | Status: DC | PRN

## 2018-05-16 MED ORDER — ONDANSETRON HCL 4 MG/2ML IJ SOLN
4.0000 mg | Freq: Four times a day (QID) | INTRAMUSCULAR | Status: DC | PRN
Start: 2018-05-16 — End: 2018-05-17

## 2018-05-16 MED ORDER — SODIUM CHLORIDE 0.9 % IR SOLN
Status: DC | PRN
  Administered 2018-05-16: 1000 mL

## 2018-05-16 MED ORDER — ROCURONIUM BROMIDE 10 MG/ML (PF) SYRINGE
PREFILLED_SYRINGE | INTRAVENOUS | Status: DC | PRN
  Administered 2018-05-16: 50 mg via INTRAVENOUS

## 2018-05-16 MED ORDER — 0.9 % SODIUM CHLORIDE (POUR BTL) OPTIME
TOPICAL | Status: DC | PRN
  Administered 2018-05-16: 1000 mL

## 2018-05-16 MED ORDER — DEXAMETHASONE SODIUM PHOSPHATE 10 MG/ML IJ SOLN
10.0000 mg | Freq: Once | INTRAMUSCULAR | Status: AC
  Administered 2018-05-17: 10 mg via INTRAVENOUS
  Filled 2018-05-16: qty 1

## 2018-05-16 MED ORDER — FENTANYL CITRATE (PF) 100 MCG/2ML IJ SOLN
INTRAMUSCULAR | Status: AC
  Filled 2018-05-16: qty 2

## 2018-05-16 MED ORDER — MIDAZOLAM HCL 5 MG/5ML IJ SOLN
INTRAMUSCULAR | Status: DC | PRN
  Administered 2018-05-16: 2 mg via INTRAVENOUS

## 2018-05-16 MED ORDER — TRAMADOL HCL 50 MG PO TABS
50.0000 mg | ORAL_TABLET | Freq: Four times a day (QID) | ORAL | Status: DC | PRN
  Administered 2018-05-16: 50 mg via ORAL
  Administered 2018-05-17: 100 mg via ORAL
  Filled 2018-05-16: qty 1
  Filled 2018-05-16: qty 2

## 2018-05-16 MED ORDER — LACTATED RINGERS IV SOLN
INTRAVENOUS | Status: DC
  Administered 2018-05-16 (×2): via INTRAVENOUS

## 2018-05-16 MED ORDER — FENTANYL CITRATE (PF) 250 MCG/5ML IJ SOLN
INTRAMUSCULAR | Status: AC
  Filled 2018-05-16: qty 5

## 2018-05-16 MED ORDER — ROCURONIUM BROMIDE 100 MG/10ML IV SOLN
INTRAVENOUS | Status: AC
  Filled 2018-05-16: qty 1

## 2018-05-16 MED ORDER — BUPIVACAINE LIPOSOME 1.3 % IJ SUSP
INTRAMUSCULAR | Status: DC | PRN
  Administered 2018-05-16: 20 mL

## 2018-05-16 MED ORDER — GABAPENTIN 300 MG PO CAPS
300.0000 mg | ORAL_CAPSULE | Freq: Three times a day (TID) | ORAL | Status: DC
  Administered 2018-05-16 – 2018-05-17 (×3): 300 mg via ORAL
  Filled 2018-05-16 (×3): qty 1

## 2018-05-16 MED ORDER — BISACODYL 10 MG RE SUPP: 10.0000 mg | Freq: Every day | RECTAL | Status: DC | PRN

## 2018-05-16 MED ORDER — ACETAMINOPHEN 500 MG PO TABS
1000.0000 mg | ORAL_TABLET | Freq: Four times a day (QID) | ORAL | Status: AC
  Administered 2018-05-16 – 2018-05-17 (×4): 1000 mg via ORAL
  Filled 2018-05-16 (×4): qty 2

## 2018-05-16 MED ORDER — FENTANYL CITRATE (PF) 100 MCG/2ML IJ SOLN
100.0000 ug | Freq: Once | INTRAMUSCULAR | Status: AC
  Administered 2018-05-16: 100 ug via INTRAVENOUS
  Filled 2018-05-16: qty 2

## 2018-05-16 MED ORDER — MORPHINE SULFATE (PF) 2 MG/ML IV SOLN: 1.0000 mg | INTRAVENOUS | Status: DC | PRN

## 2018-05-16 MED ORDER — DOCUSATE SODIUM 100 MG PO CAPS
100.0000 mg | ORAL_CAPSULE | Freq: Two times a day (BID) | ORAL | Status: DC
  Administered 2018-05-16 – 2018-05-17 (×2): 100 mg via ORAL
  Filled 2018-05-16 (×2): qty 1

## 2018-05-16 MED ORDER — TRANEXAMIC ACID 1000 MG/10ML IV SOLN
1000.0000 mg | Freq: Once | INTRAVENOUS | Status: DC
  Filled 2018-05-16: qty 10

## 2018-05-16 MED ORDER — OXYCODONE HCL 5 MG/5ML PO SOLN: 5.0000 mg | Freq: Once | ORAL | Status: AC | PRN

## 2018-05-16 MED ORDER — PROPOFOL 10 MG/ML IV BOLUS
INTRAVENOUS | Status: AC
  Filled 2018-05-16: qty 60

## 2018-05-16 MED ORDER — SODIUM CHLORIDE 0.9 % IJ SOLN
INTRAMUSCULAR | Status: AC
  Filled 2018-05-16: qty 10

## 2018-05-16 MED ORDER — MIDAZOLAM HCL 2 MG/2ML IJ SOLN
2.0000 mg | Freq: Once | INTRAMUSCULAR | Status: AC
  Administered 2018-05-16: 2 mg via INTRAVENOUS
  Filled 2018-05-16: qty 2

## 2018-05-16 MED ORDER — METOCLOPRAMIDE HCL 5 MG/ML IJ SOLN
5.0000 mg | Freq: Three times a day (TID) | INTRAMUSCULAR | Status: DC | PRN
  Filled 2018-05-16: qty 2

## 2018-05-16 MED ORDER — CHLORHEXIDINE GLUCONATE 4 % EX LIQD: 60.0000 mL | Freq: Once | CUTANEOUS | Status: DC

## 2018-05-16 MED ORDER — ONDANSETRON HCL 4 MG/2ML IJ SOLN
INTRAMUSCULAR | Status: AC
  Filled 2018-05-16: qty 2

## 2018-05-16 MED ORDER — SODIUM CHLORIDE 0.9 % IJ SOLN
INTRAMUSCULAR | Status: DC | PRN
  Administered 2018-05-16: 60 mL

## 2018-05-16 MED ORDER — DICYCLOMINE HCL 20 MG PO TABS: 20.0000 mg | ORAL_TABLET | Freq: Four times a day (QID) | ORAL | Status: DC | PRN

## 2018-05-16 MED ORDER — OXYCODONE HCL 5 MG PO TABS
5.0000 mg | ORAL_TABLET | ORAL | Status: DC | PRN
  Administered 2018-05-16 (×2): 5 mg via ORAL
  Filled 2018-05-16 (×2): qty 1

## 2018-05-16 MED ORDER — VANCOMYCIN HCL IN DEXTROSE 1-5 GM/200ML-% IV SOLN
1000.0000 mg | Freq: Two times a day (BID) | INTRAVENOUS | Status: AC
  Administered 2018-05-16: 1000 mg via INTRAVENOUS
  Filled 2018-05-16: qty 200

## 2018-05-16 MED ORDER — DEXAMETHASONE SODIUM PHOSPHATE 10 MG/ML IJ SOLN
INTRAMUSCULAR | Status: DC | PRN
  Administered 2018-05-16: 10 mg via INTRAVENOUS

## 2018-05-16 MED ORDER — FLEET ENEMA 7-19 GM/118ML RE ENEM: 1.0000 | ENEMA | Freq: Once | RECTAL | Status: DC | PRN

## 2018-05-16 MED ORDER — METHOCARBAMOL 1000 MG/10ML IJ SOLN
500.0000 mg | Freq: Four times a day (QID) | INTRAVENOUS | Status: DC | PRN
  Administered 2018-05-16: 500 mg via INTRAVENOUS
  Filled 2018-05-16: qty 550

## 2018-05-16 MED ORDER — SODIUM CHLORIDE 0.9 % IV SOLN
INTRAVENOUS | Status: DC
  Administered 2018-05-16: 15:00:00 via INTRAVENOUS

## 2018-05-16 MED ORDER — ONDANSETRON HCL 4 MG/2ML IJ SOLN: 4.0000 mg | Freq: Once | INTRAMUSCULAR | Status: DC | PRN

## 2018-05-16 MED ORDER — BUPIVACAINE-EPINEPHRINE (PF) 0.25% -1:200000 IJ SOLN
INTRAMUSCULAR | Status: AC
  Filled 2018-05-16: qty 30

## 2018-05-16 MED ORDER — OXYCODONE HCL 5 MG PO TABS
10.0000 mg | ORAL_TABLET | ORAL | Status: DC | PRN
  Administered 2018-05-16 – 2018-05-17 (×4): 15 mg via ORAL
  Filled 2018-05-16 (×4): qty 3

## 2018-05-16 MED ORDER — PHENYLEPHRINE 40 MCG/ML (10ML) SYRINGE FOR IV PUSH (FOR BLOOD PRESSURE SUPPORT)
PREFILLED_SYRINGE | INTRAVENOUS | Status: DC | PRN
  Administered 2018-05-16: 80 ug via INTRAVENOUS

## 2018-05-16 MED ORDER — MEPERIDINE HCL 50 MG/ML IJ SOLN: 6.2500 mg | INTRAMUSCULAR | Status: DC | PRN

## 2018-05-16 MED ORDER — OXYCODONE HCL 5 MG PO TABS
ORAL_TABLET | ORAL | Status: AC
  Filled 2018-05-16: qty 1

## 2018-05-16 MED ORDER — TRANEXAMIC ACID 1000 MG/10ML IV SOLN
2000.0000 mg | INTRAVENOUS | Status: DC
  Filled 2018-05-16: qty 20

## 2018-05-16 MED ORDER — SUGAMMADEX SODIUM 200 MG/2ML IV SOLN
INTRAVENOUS | Status: DC | PRN
  Administered 2018-05-16: 200 mg via INTRAVENOUS

## 2018-05-16 MED ORDER — CEFAZOLIN SODIUM-DEXTROSE 2-4 GM/100ML-% IV SOLN: 2.0000 g | INTRAVENOUS | Status: DC

## 2018-05-16 MED ORDER — ONDANSETRON HCL 4 MG/2ML IJ SOLN
INTRAMUSCULAR | Status: DC | PRN
  Administered 2018-05-16 (×2): 4 mg via INTRAVENOUS

## 2018-05-16 MED ORDER — DEXAMETHASONE SODIUM PHOSPHATE 10 MG/ML IJ SOLN
INTRAMUSCULAR | Status: AC
  Filled 2018-05-16: qty 1

## 2018-05-16 MED ORDER — OXYCODONE HCL 5 MG PO TABS
5.0000 mg | ORAL_TABLET | Freq: Once | ORAL | Status: AC | PRN
  Administered 2018-05-16: 5 mg via ORAL

## 2018-05-16 MED ORDER — ACETAMINOPHEN 325 MG PO TABS: 325.0000 mg | ORAL_TABLET | ORAL | Status: DC | PRN

## 2018-05-16 MED ORDER — ROPIVACAINE HCL 7.5 MG/ML IJ SOLN
INTRAMUSCULAR | Status: DC | PRN
  Administered 2018-05-16: 25 mL via PERINEURAL

## 2018-05-16 MED ORDER — METHOCARBAMOL 500 MG PO TABS
500.0000 mg | ORAL_TABLET | Freq: Four times a day (QID) | ORAL | Status: DC | PRN
  Administered 2018-05-16 – 2018-05-17 (×2): 500 mg via ORAL
  Filled 2018-05-16 (×3): qty 1

## 2018-05-16 MED ORDER — BUPIVACAINE HCL (PF) 0.25 % IJ SOLN
INTRAMUSCULAR | Status: AC
  Filled 2018-05-16: qty 30

## 2018-05-16 MED ORDER — SUGAMMADEX SODIUM 200 MG/2ML IV SOLN
INTRAVENOUS | Status: AC
  Filled 2018-05-16: qty 2

## 2018-05-16 SURGICAL SUPPLY — 56 items
BAG DECANTER FOR FLEXI CONT (MISCELLANEOUS) ×1 IMPLANT
BAG SPEC THK2 15X12 ZIP CLS (MISCELLANEOUS) ×1
BAG ZIPLOCK 12X15 (MISCELLANEOUS) ×3 IMPLANT
BANDAGE ACE 6X5 VEL STRL LF (GAUZE/BANDAGES/DRESSINGS) ×3 IMPLANT
BLADE SAG 18X100X1.27 (BLADE) ×3 IMPLANT
BLADE SAW SGTL 11.0X1.19X90.0M (BLADE) ×3 IMPLANT
BOWL SMART MIX CTS (DISPOSABLE) ×3 IMPLANT
CAP KNEE TOTAL 3 ×2 IMPLANT
CEMENT HV SMART SET (Cement) ×6 IMPLANT
CLOSURE WOUND 1/2 X4 (GAUZE/BANDAGES/DRESSINGS) ×2
COVER SURGICAL LIGHT HANDLE (MISCELLANEOUS) ×3 IMPLANT
CUFF TOURN SGL QUICK 34 (TOURNIQUET CUFF) ×3
CUFF TRNQT CYL 34X4X40X1 (TOURNIQUET CUFF) ×1 IMPLANT
DECANTER SPIKE VIAL GLASS SM (MISCELLANEOUS) ×3 IMPLANT
DRAPE POUCH INSTRU U-SHP 10X18 (DRAPES) ×2 IMPLANT
DRAPE TOP SHEET (DRAPES) ×2 IMPLANT
DRAPE U-SHAPE 47X51 STRL (DRAPES) ×3 IMPLANT
DRSG ADAPTIC 3X8 NADH LF (GAUZE/BANDAGES/DRESSINGS) ×3 IMPLANT
DRSG PAD ABDOMINAL 8X10 ST (GAUZE/BANDAGES/DRESSINGS) ×1 IMPLANT
DURAPREP 26ML APPLICATOR (WOUND CARE) ×3 IMPLANT
ELECT REM PT RETURN 15FT ADLT (MISCELLANEOUS) ×3 IMPLANT
EVACUATOR 1/8 PVC DRAIN (DRAIN) ×3 IMPLANT
GAUZE SPONGE 4X4 12PLY STRL (GAUZE/BANDAGES/DRESSINGS) ×3 IMPLANT
GLOVE BIO SURGEON STRL SZ 6.5 (GLOVE) ×2 IMPLANT
GLOVE BIO SURGEON STRL SZ8 (GLOVE) ×4 IMPLANT
GLOVE BIO SURGEONS STRL SZ 6.5 (GLOVE) ×1
GLOVE BIOGEL PI IND STRL 6.5 (GLOVE) ×2 IMPLANT
GLOVE BIOGEL PI IND STRL 7.0 (GLOVE) IMPLANT
GLOVE BIOGEL PI IND STRL 8 (GLOVE) ×1 IMPLANT
GLOVE BIOGEL PI INDICATOR 6.5 (GLOVE)
GLOVE BIOGEL PI INDICATOR 7.0 (GLOVE) ×2
GLOVE BIOGEL PI INDICATOR 8 (GLOVE) ×2
GOWN STRL REUS W/TWL LRG LVL3 (GOWN DISPOSABLE) ×6 IMPLANT
HANDPIECE INTERPULSE COAX TIP (DISPOSABLE) ×3
HOLDER FOLEY CATH W/STRAP (MISCELLANEOUS) ×2 IMPLANT
IMMOBILIZER KNEE 20 (SOFTGOODS) ×2 IMPLANT
IMMOBILIZER KNEE 20 THIGH 36 (SOFTGOODS) ×1 IMPLANT
MANIFOLD NEPTUNE II (INSTRUMENTS) ×3 IMPLANT
PACK TOTAL KNEE CUSTOM (KITS) ×3 IMPLANT
PAD ABD 8X10 STRL (GAUZE/BANDAGES/DRESSINGS) ×2 IMPLANT
PADDING CAST COTTON 6X4 STRL (CAST SUPPLIES) ×7 IMPLANT
POSITIONER SURGICAL ARM (MISCELLANEOUS) ×3 IMPLANT
SET HNDPC FAN SPRY TIP SCT (DISPOSABLE) ×1 IMPLANT
STRIP CLOSURE SKIN 1/2X4 (GAUZE/BANDAGES/DRESSINGS) ×4 IMPLANT
SUT MNCRL AB 4-0 PS2 18 (SUTURE) ×3 IMPLANT
SUT STRATAFIX 0 PDS 27 VIOLET (SUTURE) ×3
SUT VIC AB 2-0 CT1 27 (SUTURE) ×9
SUT VIC AB 2-0 CT1 27XBRD (SUTURE) IMPLANT
SUT VIC AB 2-0 CT1 TAPERPNT 27 (SUTURE) ×3 IMPLANT
SUTURE STRATFX 0 PDS 27 VIOLET (SUTURE) ×1 IMPLANT
SYR 50ML LL SCALE MARK (SYRINGE) ×1 IMPLANT
TRAY FOLEY CATH 14FR (SET/KITS/TRAYS/PACK) ×2 IMPLANT
TRAY FOLEY MTR SLVR 16FR STAT (SET/KITS/TRAYS/PACK) ×1 IMPLANT
WATER STERILE IRR 1000ML POUR (IV SOLUTION) ×6 IMPLANT
WRAP KNEE MAXI GEL POST OP (GAUZE/BANDAGES/DRESSINGS) ×3 IMPLANT
YANKAUER SUCT BULB TIP 10FT TU (MISCELLANEOUS) ×3 IMPLANT

## 2018-05-16 NOTE — Addendum Note (Signed)
Addendum  created 05/16/18 1407 by Mitzie Na, CRNA   Intraprocedure Flowsheets edited

## 2018-05-16 NOTE — Anesthesia Postprocedure Evaluation (Signed)
Anesthesia Post Note  Patient: Christine [name]  Procedure(s) Performed: LEFT TOTAL KNEE ARTHROPLASTY (Left Knee)     Patient location during evaluation: PACU Anesthesia Type: General Level of consciousness: awake and alert Pain management: pain level controlled Vital Signs Assessment: post-procedure vital signs reviewed and stable Respiratory status: spontaneous breathing, nonlabored ventilation, respiratory function stable and patient connected to nasal cannula oxygen Cardiovascular status: blood pressure returned to baseline and stable Postop Assessment: no apparent nausea or vomiting Anesthetic complications: no    Last Vitals:  Vitals:   05/16/18 1017 05/16/18 1232  BP: 129/75 118/73  Pulse: 84 86  Resp: (!) 9 14  Temp:  36.9 C  SpO2: 98% 97%    Last Pain:  Vitals:   05/16/18 1232  TempSrc:   PainSc: 10-Worst pain ever                 Niv Darley

## 2018-05-16 NOTE — Anesthesia Procedure Notes (Addendum)
Anesthesia Regional Block: Adductor canal block   Pre-Anesthetic Checklist: ,, timeout performed, Correct Patient, Correct Site, Correct Laterality, Correct Procedure, Correct Position, site marked, Risks and benefits discussed,  Surgical consent,  Pre-op evaluation,  At surgeon's request and post-op pain management  Laterality: Left  Prep: chloraprep       Needles:  Injection technique: Single-shot  Needle Type: Echogenic Stimulator Needle     Needle Length: 5cm  Needle Gauge: 22     Additional Needles:   Procedures:, nerve stimulator,,, ultrasound used (permanent image in chart),,,,  Narrative:  Start time: 05/16/2018 9:50 AM End time: 05/16/2018 9:55 AM Injection made incrementally with aspirations every 5 mL.  Performed by: Personally  Anesthesiologist: Janeece Riggers, MD  Additional Notes: Functioning IV was confirmed and monitors were applied.  A 78mm 22ga Arrow echogenic stimulator needle was used. Sterile prep and drape,hand hygiene and sterile gloves were used. Ultrasound guidance: relevant anatomy identified, needle position confirmed, local anesthetic spread visualized around nerve(s)., vascular puncture avoided.  Image printed for medical record. Negative aspiration and negative test dose prior to incremental administration of local anesthetic. The patient tolerated the procedure well.

## 2018-05-16 NOTE — Progress Notes (Signed)
PT Cancellation Note  Patient Details Name: Christine [name] MRN: [id number] DOB: 31-Oct-1951   Cancelled Treatment:    Reason Eval/Treat Not Completed: Pain limiting ability to participate( will follow)   Philomena Doheny 05/16/2018, 5:08 PM [phone]

## 2018-05-16 NOTE — Anesthesia Procedure Notes (Signed)
Procedure Name: Intubation Date/Time: 05/16/2018 10:43 AM Performed by: Mitzie Na, CRNA Pre-anesthesia Checklist: Patient identified, Emergency Drugs available, Suction available, Patient being monitored and Timeout performed Patient Re-evaluated:Patient Re-evaluated prior to induction Oxygen Delivery Method: Circle system utilized Preoxygenation: Pre-oxygenation with 100% oxygen Induction Type: IV induction Ventilation: Mask ventilation without difficulty Laryngoscope Size: Mac and 3 Grade View: Grade I Tube type: Oral Tube size: 7.0 mm Number of attempts: 1 Airway Equipment and Method: Stylet Placement Confirmation: ETT inserted through vocal cords under direct vision,  positive ETCO2 and breath sounds checked- equal and bilateral Secured at: 22 cm Tube secured with: Tape Dental Injury: Teeth and Oropharynx as per pre-operative assessment

## 2018-05-16 NOTE — Progress Notes (Signed)
Assisted Dr. Oddono with left, ultrasound guided, adductor canal block. Side rails up, monitors on throughout procedure. See vital signs in flow sheet. Tolerated Procedure well.  

## 2018-05-16 NOTE — Discharge Instructions (Signed)
° °Dr. Frank Aluisio °Total Joint Specialist °[facility] °[address] °[place], [place] [address] °[phone] ° °TOTAL KNEE REPLACEMENT POSTOPERATIVE DIRECTIONS ° °Knee Rehabilitation, Guidelines Following Surgery  °Results after knee surgery are often greatly improved when you follow the exercise, range of motion and muscle strengthening exercises prescribed by your doctor. Safety measures are also important to protect the knee from further injury. Any time any of these exercises cause you to have increased pain or swelling in your knee joint, decrease the amount until you are comfortable again and slowly increase them. If you have problems or questions, call your caregiver or physical therapist for advice.  ° °HOME CARE INSTRUCTIONS  °• Remove items at home which could result in a fall. This includes throw rugs or furniture in walking pathways.  °· ICE to the affected knee every three hours for 30 minutes at a time and then as needed for pain and swelling.  Continue to use ice on the knee for pain and swelling from surgery. You may notice swelling that will progress down to the foot and ankle.  This is normal after surgery.  Elevate the leg when you are not up walking on it.   °· Continue to use the breathing machine which will help keep your temperature down.  It is common for your temperature to cycle up and down following surgery, especially at night when you are not up moving around and exerting yourself.  The breathing machine keeps your lungs expanded and your temperature down. °· Do not place pillow under knee, focus on keeping the knee straight while resting ° °DIET °You may resume your previous home diet once your are discharged from the hospital. ° °DRESSING / WOUND CARE / SHOWERING °You may shower 3 days after surgery, but keep the wounds dry during showering.  You may use an occlusive plastic wrap (Press'n Seal for example), NO SOAKING/SUBMERGING IN THE BATHTUB.  If the bandage  gets wet, change with a clean dry gauze.  If the incision gets wet, pat the wound dry with a clean towel. °You may start showering once you are discharged home but do not submerge the incision under water. Just pat the incision dry and apply a dry gauze dressing on daily. °Change the surgical dressing daily and reapply a dry dressing each time. ° °ACTIVITY °Walk with your walker as instructed. °Use walker as long as suggested by your caregivers. °Avoid periods of inactivity such as sitting longer than an hour when not asleep. This helps prevent blood clots.  °You may resume a sexual relationship in one month or when given the OK by your doctor.  °You may return to work once you are cleared by your doctor.  °Do not drive a car for 6 weeks or until released by you surgeon.  °Do not drive while taking narcotics. ° °WEIGHT BEARING °Weight bearing as tolerated with assist device (walker, cane, etc) as directed, use it as long as suggested by your surgeon or therapist, typically at least 4-6 weeks. ° °POSTOPERATIVE CONSTIPATION PROTOCOL °Constipation - defined medically as fewer than three stools per week and severe constipation as less than one stool per week. ° °One of the most common issues patients have following surgery is constipation.  Even if you have a regular bowel pattern at home, your normal regimen is likely to be disrupted due to multiple reasons following surgery.  Combination of anesthesia, postoperative narcotics, change in appetite and fluid intake all can affect your bowels.    In order to avoid complications following surgery, here are some recommendations in order to help you during your recovery period. ° °Colace (docusate) - Pick up an over-the-counter form of Colace or another stool softener and take twice a day as long as you are requiring postoperative pain medications.  Take with a full glass of water daily.  If you experience loose stools or diarrhea, hold the colace until you stool forms back  up.  If your symptoms do not get better within 1 week or if they get worse, check with your doctor. ° °Dulcolax (bisacodyl) - Pick up over-the-counter and take as directed by the product packaging as needed to assist with the movement of your bowels.  Take with a full glass of water.  Use this product as needed if not relieved by Colace only.  ° °MiraLax (polyethylene glycol) - Pick up over-the-counter to have on hand.  MiraLax is a solution that will increase the amount of water in your bowels to assist with bowel movements.  Take as directed and can mix with a glass of water, juice, soda, coffee, or tea.  Take if you go more than two days without a movement. °Do not use MiraLax more than once per day. Call your doctor if you are still constipated or irregular after using this medication for 7 days in a row. ° °If you continue to have problems with postoperative constipation, please contact the office for further assistance and recommendations.  If you experience "the worst abdominal pain ever" or develop nausea or vomiting, please contact the office immediatly for further recommendations for treatment. ° °ITCHING ° If you experience itching with your medications, try taking only a single pain pill, or even half a pain pill at a time.  You can also use Benadryl over the counter for itching or also to help with sleep.  ° °TED HOSE STOCKINGS °Wear the elastic stockings on both legs for three weeks following surgery during the day but you may remove then at night for sleeping. ° °MEDICATIONS °See your medication summary on the “After Visit Summary” that the nursing staff will review with you prior to discharge.  You may have some home medications which will be placed on hold until you complete the course of blood thinner medication.  It is important for you to complete the blood thinner medication as prescribed by your surgeon.  Continue your approved medications as instructed at time of discharge. ° °PRECAUTIONS °If  you experience chest pain or shortness of breath - call 911 immediately for transfer to the hospital emergency department.  °If you develop a fever greater that 101 F, purulent drainage from wound, increased redness or drainage from wound, foul odor from the wound/dressing, or calf pain - CONTACT YOUR SURGEON.   °                                                °FOLLOW-UP APPOINTMENTS °Make sure you keep all of your appointments after your operation with your surgeon and caregivers. You should call the office at the above phone number and make an appointment for approximately two weeks after the date of your surgery or on the date instructed by your surgeon outlined in the "After Visit Summary". ° ° °RANGE OF MOTION AND STRENGTHENING EXERCISES  °Rehabilitation of the knee is important following a knee injury or   an operation. After just a few days of immobilization, the muscles of the thigh which control the knee become weakened and shrink (atrophy). Knee exercises are designed to build up the tone and strength of the thigh muscles and to improve knee motion. Often times heat used for twenty to thirty minutes before working out will loosen up your tissues and help with improving the range of motion but do not use heat for the first two weeks following surgery. These exercises can be done on a training (exercise) mat, on the floor, on a table or on a bed. Use what ever works the best and is most comfortable for you Knee exercises include:  °• Leg Lifts - While your knee is still immobilized in a splint or cast, you can do straight leg raises. Lift the leg to 60 degrees, hold for 3 sec, and slowly lower the leg. Repeat 10-20 times 2-3 times daily. Perform this exercise against resistance later as your knee gets better.  °• Quad and Hamstring Sets - Tighten up the muscle on the front of the thigh (Quad) and hold for 5-10 sec. Repeat this 10-20 times hourly. Hamstring sets are done by pushing the foot backward against an  object and holding for 5-10 sec. Repeat as with quad sets.  °· Leg Slides: Lying on your back, slowly slide your foot toward your buttocks, bending your knee up off the floor (only go as far as is comfortable). Then slowly slide your foot back down until your leg is flat on the floor again. °· Angel Wings: Lying on your back spread your legs to the side as far apart as you can without causing discomfort.  °A rehabilitation program following serious knee injuries can speed recovery and prevent re-injury in the future due to weakened muscles. Contact your doctor or a physical therapist for more information on knee rehabilitation.  ° °IF YOU ARE TRANSFERRED TO A SKILLED REHAB FACILITY °If the patient is transferred to a skilled rehab facility following release from the hospital, a list of the current medications will be sent to the facility for the patient to continue.  When discharged from the skilled rehab facility, please have the facility set up the patient's Home Health Physical Therapy prior to being released. Also, the skilled facility will be responsible for providing the patient with their medications at time of release from the facility to include their pain medication, the muscle relaxants, and their blood thinner medication. If the patient is still at the rehab facility at time of the two week follow up appointment, the skilled rehab facility will also need to assist the patient in arranging follow up appointment in our office and any transportation needs. ° °MAKE SURE YOU:  °• Understand these instructions.  °• Get help right away if you are not doing well or get worse.  ° ° °Pick up stool softner and laxative for home use following surgery while on pain medications. °Do not submerge incision under water. °Please use good hand washing techniques while changing dressing each day. °May shower starting three days after surgery. °Please use a clean towel to pat the incision dry following showers. °Continue to  use ice for pain and swelling after surgery. °Do not use any lotions or creams on the incision until instructed by your surgeon. ° °

## 2018-05-16 NOTE — Progress Notes (Addendum)
Charted note not applicable

## 2018-05-16 NOTE — Plan of Care (Signed)
  Problem: Clinical Measurements: Goal: Ability to maintain clinical measurements within normal limits will improve Outcome: Progressing   Problem: Activity: Goal: Risk for activity intolerance will decrease Outcome: Progressing   Problem: Nutrition: Goal: Adequate nutrition will be maintained Outcome: Progressing   Problem: Coping: Goal: Level of anxiety will decrease Outcome: Progressing   

## 2018-05-16 NOTE — Interval H&P Note (Signed)
History and Physical Interval Note:  05/16/2018 8:19 AM  Christine [name]  has presented today for surgery, with the diagnosis of Osteoarthritis Left Knee  The various methods of treatment have been discussed with the patient and family. After consideration of risks, benefits and other options for treatment, the patient has consented to  Procedure(s): LEFT TOTAL LEFT KNEE ARTHROPLASTY (Left) as a surgical intervention .  The patient's history has been reviewed, patient examined, no change in status, stable for surgery.  I have reviewed the patient's chart and labs.  Questions were answered to the patient's satisfaction.     Pilar Plate Nicklas Mcsweeney

## 2018-05-16 NOTE — Transfer of Care (Signed)
Immediate Anesthesia Transfer of Care Note  Patient: Christine [name]  Procedure(s) Performed: LEFT TOTAL KNEE ARTHROPLASTY (Left Knee)  Patient Location: PACU  Anesthesia Type:General  Level of Consciousness: drowsy and patient cooperative  Airway & Oxygen Therapy: Patient Spontanous Breathing and Patient connected to face mask oxygen  Post-op Assessment: Report given to RN, Post -op Vital signs reviewed and stable and Patient moving all extremities  Post vital signs: Reviewed and stable  Last Vitals:  Vitals Value Taken Time  BP 118/73 05/16/2018 12:32 PM  Temp    Pulse 82 05/16/2018 12:35 PM  Resp 18 05/16/2018 12:35 PM  SpO2 100 % 05/16/2018 12:35 PM  Vitals shown include unvalidated device data.  Last Pain:  Vitals:   05/16/18 1017  TempSrc:   PainSc: 0-No pain      Patients Stated Pain Goal: 4 (15/05/69 7948)  Complications: No apparent anesthesia complications

## 2018-05-16 NOTE — Op Note (Signed)
OPERATIVE REPORT-TOTAL KNEE ARTHROPLASTY   Pre-operative diagnosis- Osteoarthritis  Left knee(s)  Post-operative diagnosis- Osteoarthritis Left knee(s)  Procedure-  Left  Total Knee Arthroplasty (Aesculap titanium knee)  Surgeon- Dione Plover. Donda Friedli, MD  Assistant- Theresa Duty, PA-C   Anesthesia-  Adductor canal block and General  EBL-25 mL   Drains Hemovac  Tourniquet time-  Total Tourniquet Time Documented: Thigh (Left) - 41 minutes Total: Thigh (Left) - 41 minutes     Complications- None  Condition-PACU - hemodynamically stable.   Brief Clinical Note  Christine [name] is a 67 y.o. year old female with end stage OA of her left knee with progressively worsening pain and dysfunction. She has constant pain, with activity and at rest and significant functional deficits with difficulties even with ADLs. She has had extensive non-op management including analgesics, injections of cortisone and viscosupplements, and home exercise program, but remains in significant pain with significant dysfunction. Radiographs show bone on bone arthritis medial and patellofemoral. She presents now for left Total Knee Arthroplasty.    Procedure in detail---   The patient is brought into the operating room and positioned supine on the operating table. After successful administration of  Adductor canal block and General,   a tourniquet is placed high on the  Left thigh(s) and the lower extremity is prepped and draped in the usual sterile fashion. Time out is performed by the operating team and then the  Left lower extremity is wrapped in Esmarch, knee flexed and the tourniquet inflated to 300 mmHg.       A midline incision is made with a ten blade through the subcutaneous tissue to the level of the extensor mechanism. A fresh blade is used to make a medial parapatellar arthrotomy. Soft tissue over the proximal medial tibia is subperiosteally elevated to the joint line with a knife and into the  semimembranosus bursa with a Cobb elevator. Soft tissue over the proximal lateral tibia is elevated with attention being paid to avoiding the patellar tendon on the tibial tubercle. The patella is everted, knee flexed 90 degrees and the ACL and PCL are removed. Findings are bone on bone medial and patellofemoral with large global osteophytes.        The drill is used to create a starting hole in the distal femur and the canal is thoroughly irrigated with sterile saline to remove the fatty contents. The 5 degree Left  valgus alignment guide is placed into the femoral canal and the distal femoral cutting block is pinned to remove 9 mm off the distal femur. Resection is made with an oscillating saw.      The tibia is subluxed forward and the menisci are removed. The extramedullary alignment guide is placed referencing proximally at the medial aspect of the tibial tubercle and distally along the second metatarsal axis and tibial crest. The block is pinned to remove 62mm off the more deficient medial  side. Resection is made with an oscillating saw. Size 2is the most appropriate size for the tibia and the proximal tibia is prepared with the modular drill and keel punch for that size.      The femoral sizing guide is placed and size 4 is most appropriate. Rotation is marked off the epicondylar axis and confirmed by creating a rectangular flexion gap at 90 degrees. The size 4 cutting block is pinned in this rotation and the anterior, posterior and chamfer cuts are made with the oscillating saw. The intercondylar block is then placed and that cut  is made.      Trial size 2 tibial component, trial size 4 posterior stabilized femur and a 14  mm posterior stabilized fixed bearing insert trial is placed. Full extension is achieved with excellent varus/valgus and anterior/posterior balance throughout full range of motion. The patella is everted and thickness measured to be 22  mm. Free hand resection is taken to 12 mm, a 3  template is placed, lug holes are drilled, trial patella is placed, and it tracks normally. Osteophytes are removed off the posterior femur with the trial in place. All trials are removed and the cut bone surfaces prepared with pulsatile lavage. Cement is mixed and once ready for implantation, the size 2 tibial implant, size  4 posterior stabilized femoral component, and the size 3 patella are cemented in place and the patella is held with the clamp. The trial insert is placed and the knee held in full extension. The Exparel (20 ml mixed with 60 ml saline) is injected into the extensor mechanism, posterior capsule, medial and lateral gutters and subcutaneous tissues.  All extruded cement is removed and once the cement is hard the permanent 14 mm posterior stabilized fixed bearing insert is placed into the tibial tray. The screw is then placed to lock the polyethylene in place.      The wound is copiously irrigated with saline solution and the extensor mechanism closed over a hemovac drain with #1 V-loc suture. The tourniquet is released for a total tourniquet time of 41  minutes. Flexion against gravity is 140 degrees and the patella tracks normally. Subcutaneous tissue is closed with 2.0 vicryl and subcuticular with running 4.0 Monocryl. The incision is cleaned and dried and steri-strips and a bulky sterile dressing are applied. The limb is placed into a knee immobilizer and the patient is awakened and transported to recovery in stable condition.      Please note that a surgical assistant was a medical necessity for this procedure in order to perform it in a safe and expeditious manner. Surgical assistant was necessary to retract the ligaments and vital neurovascular structures to prevent injury to them and also necessary for proper positioning of the limb to allow for anatomic placement of the prosthesis.   Dione Plover Tresia Revolorio, MD    05/16/2018, 11:51 AM

## 2018-05-17 LAB — BASIC METABOLIC PANEL
Anion gap: 7 (ref 5–15)
BUN: 6 mg/dL (ref 6–20)
CHLORIDE: 108 mmol/L (ref 101–111)
CO2: 28 mmol/L (ref 22–32)
CREATININE: 0.68 mg/dL (ref 0.44–1.00)
Calcium: 8.5 mg/dL — ABNORMAL LOW (ref 8.9–10.3)
GFR calc Af Amer: >60 mL/min (ref >60)
GFR calc non Af Amer: >60 mL/min (ref >60)
Glucose, Bld: 122 mg/dL — ABNORMAL HIGH (ref 65–99)
Potassium: 3.8 mmol/L (ref 3.5–5.1)
Sodium: 143 mmol/L (ref 135–145)

## 2018-05-17 LAB — CBC
HEMATOCRIT: 32.1 % — AB (ref 36.0–46.0)
HEMOGLOBIN: 10.3 g/dL — AB (ref 12.0–15.0)
MCH: 30.4 pg (ref 26.0–34.0)
MCHC: 32.1 g/dL (ref 30.0–36.0)
MCV: 94.7 fL (ref 78.0–100.0)
Platelets: 251 10*3/uL (ref 150–400)
RBC: 3.39 MIL/uL — ABNORMAL LOW (ref 3.87–5.11)
RDW: 12.9 % (ref 11.5–15.5)
WBC: 11.8 10*3/uL — ABNORMAL HIGH (ref 4.0–10.5)

## 2018-05-17 MED ORDER — METHOCARBAMOL 500 MG PO TABS: 500.0000 mg | ORAL_TABLET | Freq: Four times a day (QID) | ORAL | 0 refills | Status: DC | PRN

## 2018-05-17 MED ORDER — GABAPENTIN 300 MG PO CAPS: 300.0000 mg | ORAL_CAPSULE | Freq: Three times a day (TID) | ORAL | 0 refills | Status: DC

## 2018-05-17 MED ORDER — ONDANSETRON HCL 4 MG PO TABS: 4.0000 mg | ORAL_TABLET | Freq: Four times a day (QID) | ORAL | 0 refills | Status: DC | PRN

## 2018-05-17 MED ORDER — OXYCODONE HCL 5 MG PO TABS: 5.0000 mg | ORAL_TABLET | Freq: Four times a day (QID) | ORAL | 0 refills | Status: DC | PRN

## 2018-05-17 MED ORDER — TRAMADOL HCL 50 MG PO TABS: 50.0000 mg | ORAL_TABLET | Freq: Four times a day (QID) | ORAL | 0 refills | Status: DC | PRN

## 2018-05-17 MED ORDER — ASPIRIN 325 MG PO TBEC: 325.0000 mg | DELAYED_RELEASE_TABLET | Freq: Two times a day (BID) | ORAL | 0 refills | Status: AC

## 2018-05-17 NOTE — Plan of Care (Signed)
  Problem: Clinical Measurements: Goal: Cardiovascular complication will be avoided Outcome: Progressing   Problem: Activity: Goal: Risk for activity intolerance will decrease Outcome: Progressing   Problem: Nutrition: Goal: Adequate nutrition will be maintained Outcome: Progressing   Problem: Elimination: Goal: Will not experience complications related to bowel motility Outcome: Progressing   

## 2018-05-17 NOTE — Progress Notes (Signed)
   Subjective: 1 Day Post-Op Procedure(s) (LRB): LEFT TOTAL KNEE ARTHROPLASTY (Left) Patient reports pain as mild.   We will start therapy today.  Plan is to go Home after hospital stay.  Objective: Vital signs in last 24 hours: Temp:  [97.7 F (36.5 C)-98.9 F (37.2 C)] 98.9 F (37.2 C) (06/18 1059) Pulse Rate:  [71-90] 82 (06/18 1059) Resp:  [13-22] 16 (06/18 1059) BP: (116-139)/(70-94) 116/70 (06/18 1059) SpO2:  [96 %-100 %] 96 % (06/18 1059)  Intake/Output from previous day:  Intake/Output Summary (Last 24 hours) at 05/17/2018 1139 Last data filed at 05/17/2018 0908 Gross per 24 hour  Intake 2680 ml  Output 3950 ml  Net -1270 ml    Intake/Output this shift: Total I/O In: 240 [P.O.:240] Out: 300 [Urine:300]  Labs: Recent Labs    05/17/18 0547  HGB 10.3*   Recent Labs    05/17/18 0547  WBC 11.8*  RBC 3.39*  HCT 32.1*  PLT 251   Recent Labs    05/17/18 0547  NA 143  K 3.8  CL 108  CO2 28  BUN 6  CREATININE 0.68  GLUCOSE 122*  CALCIUM 8.5*   No results for input(s): LABPT, INR in the last 72 hours.  EXAM General - Patient is Alert, Appropriate and Oriented Extremity - Neurologically intact Neurovascular intact No cellulitis present Compartment soft Dressing - dressing C/D/I Motor Function - intact, moving foot and toes well on exam.  Hemovac pulled without difficulty.  Past Medical History:  Diagnosis Date  . Anemia 2012   hx of   . Arthritis    "q joint in my body; it's eating me up" (07/27/2012)  . Blood transfusion 1970's   bleeding  after a fertility exploratory surgery   . Bronchitis    "once"  . Chronic lower back pain 07/27/2012   "they said I need another fusion right now"  . Complication of anesthesia 1990's   "didn't have me totally asleep when they put ETT in; very traumatic"  . Depression   . GERD (gastroesophageal reflux disease)    not on prescription meds  . High cholesterol   . IBS (irritable bowel syndrome)    recently diagnosed; no current problems  . OA (osteoarthritis)   . PONV (postoperative nausea and vomiting)   . Spinal headache    after spinal anesthetic; has had blood patch  . Urinary tract infection    hx of     Assessment/Plan: 1 Day Post-Op Procedure(s) (LRB): LEFT TOTAL KNEE ARTHROPLASTY (Left) Principal Problem:   OA (osteoarthritis) of knee   Advance diet Up with therapy D/C IV fluids  Possible discharge this afternoon if comfortable and therapy goals are acheived  DVT Prophylaxis - Aspirin Weight-Bearing as tolerated to left leg   Gaynelle Arabian 05/17/2018, 11:39 AM

## 2018-05-17 NOTE — Progress Notes (Signed)
Spoke with patient and spouse at bedside. Confirmed plan for OP PT, already arranged. Has RW and 3n1. [phone] 

## 2018-05-17 NOTE — Progress Notes (Signed)
Physical Therapy Treatment Patient Details Name: Christine [name] MRN: [id number] DOB: Nov 23, 1951 Today's Date: 05/17/2018    History of Present Illness L TKA    PT Comments    Patient is progressing well. Ready for Dc.   Follow Up Recommendations  Follow surgeon's recommendation for DC plan and follow-up therapies;Outpatient PT     Equipment Recommendations  None recommended by PT    Recommendations for Other Services       Precautions / Restrictions Precautions Precautions: Knee;Fall Precaution Comments: does not need KI    Mobility  Bed Mobility Overal bed mobility: Modified Independent             General bed mobility comments: used belt to lift the leg  Transfers Overall transfer level: Needs assistance Equipment used: Rolling walker (2 wheeled) Transfers: Sit to/from Stand Sit to Stand: Supervision         General transfer comment: cues for technique  Ambulation/Gait Ambulation/Gait assistance: Supervision Gait Distance (Feet): 100 Feet Assistive device: Rolling walker (2 wheeled) Gait Pattern/deviations: Step-to pattern     General Gait Details: cues for sequence   Stairs Stairs: Yes Stairs assistance: Supervision Stair Management: Step to pattern;Two rails;Forwards Number of Stairs: 2 General stair comments: pateitn has a crutch to use with 1 rail   Wheelchair Mobility    Modified Rankin (Stroke Patients Only)       Balance                                            Cognition Arousal/Alertness: Awake/alert Behavior During Therapy: WFL for tasks assessed/performed Overall Cognitive Status: Within Functional Limits for tasks assessed                                        Exercises   General Comments        Pertinent Vitals/Pain Pain Assessment: 0-10 Pain Score: 2  Pain Location: posterior knee Pain Descriptors / Indicators: Aching Pain Intervention(s): Monitored during  session;Premedicated before session    Home Living Family/patient expects to be discharged to:: Private residence Living Arrangements: Spouse/significant other Available Help at Discharge: Family Type of Home: House Home Access: Stairs to enter Entrance Stairs-Rails: Right Home Layout: Able to live on main level with bedroom/bathroom;Two level Home Equipment: Walker - 2 wheels;Cane - single point      Prior Function Level of Independence: Independent          PT Goals (current goals can now be found in the care plan section) Acute Rehab PT Goals Patient Stated Goal: to go home PT Goal Formulation: With patient Time For Goal Achievement: 05/20/18 Potential to Achieve Goals: Good Progress towards PT goals: Progressing toward goals    Frequency    7X/week      PT Plan Current plan remains appropriate    Co-evaluation              AM-PAC PT "6 Clicks" Daily Activity  Outcome Measure  Difficulty turning over in bed (including adjusting bedclothes, sheets and blankets)?: None Difficulty moving from lying on back to sitting on the side of the bed? : None Difficulty sitting down on and standing up from a chair with arms (e.g., wheelchair, bedside commode, etc,.)?: A Little Help needed moving to and from a bed to  chair (including a wheelchair)?: A Little Help needed walking in hospital room?: A Little Help needed climbing 3-5 steps with a railing? : A Little 6 Click Score: 20    End of Session   Activity Tolerance: Patient tolerated treatment well Patient left: in bed Nurse Communication: Mobility status PT Visit Diagnosis: Unsteadiness on feet (R26.81);Pain Pain - Right/Left: Left Pain - part of body: Knee     Time: 9024-0973 PT Time Calculation (min) (ACUTE ONLY): 21 min  Charges:  $Gait Training: 8-22 mins                    G Codes:          Claretha Cooper 05/17/2018, 1:51 PM

## 2018-05-17 NOTE — Evaluation (Signed)
Physical Therapy Evaluation Patient Details Name: Christine [name] MRN: [id number] DOB: 1951/10/18 Today's Date: 05/17/2018   History of Present Illness  L TKA  Clinical Impression  The patient is progressing well. Reports most pain in popliteal fossa area, tightness. Plans Dc today  After PT. Pt admitted with above diagnosis. Pt currently with functional limitations due to the deficits listed below (see PT Problem List). Pt will benefit from skilled PT to increase their independence and safety with mobility to allow discharge to the venue listed below.       Follow Up Recommendations Follow surgeon's recommendation for DC plan and follow-up therapies;Outpatient PT    Equipment Recommendations  None recommended by PT    Recommendations for Other Services       Precautions / Restrictions Precautions Precautions: Knee;Fall Precaution Comments: does not need KI Restrictions Weight Bearing Restrictions: No      Mobility  Bed Mobility Overal bed mobility: Modified Independent                Transfers Overall transfer level: Needs assistance Equipment used: Rolling walker (2 wheeled) Transfers: Sit to/from Stand Sit to Stand: Supervision         General transfer comment: cues for technique  Ambulation/Gait Ambulation/Gait assistance: Min guard Gait Distance (Feet): 80 Feet Assistive device: Rolling walker (2 wheeled) Gait Pattern/deviations: Step-to pattern;Step-through pattern;Antalgic     General Gait Details: cues for sequence  Stairs            Wheelchair Mobility    Modified Rankin (Stroke Patients Only)       Balance                                             Pertinent Vitals/Pain Pain Assessment: 0-10 Pain Score: 5  Pain Location: posterior knee Pain Descriptors / Indicators: Aching Pain Intervention(s): Monitored during session;Premedicated before session;Ice applied;Limited activity within patient's  tolerance    Home Living Family/patient expects to be discharged to:: Private residence Living Arrangements: Spouse/significant other Available Help at Discharge: Family Type of Home: House Home Access: Stairs to enter Entrance Stairs-Rails: Right Entrance Stairs-Number of Steps: 3 Home Layout: Able to live on main level with bedroom/bathroom;Two level Home Equipment: Walker - 2 wheels;Cane - single point      Prior Function Level of Independence: Independent               Hand Dominance   Dominant Hand: Right    Extremity/Trunk Assessment   Upper Extremity Assessment Upper Extremity Assessment: Overall WFL for tasks assessed    Lower Extremity Assessment Lower Extremity Assessment: LLE deficits/detail LLE Deficits / Details: 10-50 left knee flex       Communication   Communication: No difficulties  Cognition Arousal/Alertness: Awake/alert Behavior During Therapy: WFL for tasks assessed/performed Overall Cognitive Status: Within Functional Limits for tasks assessed                                        General Comments      Exercises Total Joint Exercises Ankle Circles/Pumps: AROM;Both;10 reps Quad Sets: AROM;Both;10 reps Towel Squeeze: AROM;Left;10 reps Short Arc Quad: AROM;Left;10 reps Heel Slides: AAROM;Left;10 reps Hip ABduction/ADduction: AAROM;Left;10 reps Straight Leg Raises: AAROM;Left;10 reps   Assessment/Plan    PT Assessment Patient needs continued  PT services  PT Problem List Decreased range of motion;Decreased strength;Decreased knowledge of use of DME;Decreased activity tolerance;Decreased safety awareness;Decreased knowledge of precautions;Pain       PT Treatment Interventions DME instruction;Gait training;Stair training;Functional mobility training;Therapeutic activities;Therapeutic exercise;Patient/family education    PT Goals (Current goals can be found in the Care Plan section)  Acute Rehab PT Goals Patient  Stated Goal: to go home PT Goal Formulation: With patient Time For Goal Achievement: 05/20/18 Potential to Achieve Goals: Good    Frequency 7X/week   Barriers to discharge        Co-evaluation               AM-PAC PT "6 Clicks" Daily Activity  Outcome Measure Difficulty turning over in bed (including adjusting bedclothes, sheets and blankets)?: None Difficulty moving from lying on back to sitting on the side of the bed? : None Difficulty sitting down on and standing up from a chair with arms (e.g., wheelchair, bedside commode, etc,.)?: A Little Help needed moving to and from a bed to chair (including a wheelchair)?: A Little Help needed walking in hospital room?: A Little Help needed climbing 3-5 steps with a railing? : A Lot 6 Click Score: 19    End of Session   Activity Tolerance: Patient tolerated treatment well Patient left: in bed;with call bell/phone within reach;with family/visitor present   PT Visit Diagnosis: Unsteadiness on feet (R26.81);Pain Pain - Right/Left: Left Pain - part of body: Knee    Time: 7412-8786 PT Time Calculation (min) (ACUTE ONLY): 36 min   Charges:   PT Evaluation $PT Eval Low Complexity: 1 Low PT Treatments $Gait Training: 8-22 mins   PT G CodesTresa Endo PT [phone]   Claretha Cooper 05/17/2018, 10:39 AM

## 2018-05-18 NOTE — Discharge Summary (Signed)
Physician Discharge Summary   Patient ID: Christine [name] MRN: [id number] DOB/AGE: May 26, 1951 67 y.o.  Admit date: 05/16/2018 Discharge date: 05/17/2018  Primary Diagnosis: Osteoarthritis left knee   Admission Diagnoses:  Past Medical History:  Diagnosis Date  . Anemia 2012   hx of   . Arthritis    "q joint in my body; it's eating me up" (07/27/2012)  . Blood transfusion 1970's   bleeding  after a fertility exploratory surgery   . Bronchitis    "once"  . Chronic lower back pain 07/27/2012   "they said I need another fusion right now"  . Complication of anesthesia 1990's   "didn't have me totally asleep when they put ETT in; very traumatic"  . Depression   . GERD (gastroesophageal reflux disease)    not on prescription meds  . High cholesterol   . IBS (irritable bowel syndrome)    recently diagnosed; no current problems  . OA (osteoarthritis)   . PONV (postoperative nausea and vomiting)   . Spinal headache    after spinal anesthetic; has had blood patch  . Urinary tract infection    hx of    Discharge Diagnoses:   Principal Problem:   OA (osteoarthritis) of knee  Estimated body mass index is 28.29 kg/m as calculated from the following:   Height as of this encounter: 5' 5" (1.651 m).   Weight as of this encounter: 77.1 kg (170 lb).  Procedure:  Procedure(s) (LRB): LEFT TOTAL KNEE ARTHROPLASTY (Left)   Consults: None  HPI: Christine [name] is a 67 y.o. year old female with end stage OA of her left knee with progressively worsening pain and dysfunction. She has constant pain, with activity and at rest and significant functional deficits with difficulties even with ADLs. She has had extensive non-op management including analgesics, injections of cortisone and viscosupplements, and home exercise program, but remains in significant pain with significant dysfunction. Radiographs show bone on bone arthritis medial and patellofemoral. She presents now for left Total  Knee Arthroplasty.  Laboratory Data: Admission on 05/16/2018, Discharged on 05/17/2018  Component Date Value Ref Range Status  . WBC 05/17/2018 11.8* 4.0 - 10.5 K/uL Final  . RBC 05/17/2018 3.39* 3.87 - 5.11 MIL/uL Final  . Hemoglobin 05/17/2018 10.3* 12.0 - 15.0 g/dL Final  . HCT 05/17/2018 32.1* 36.0 - 46.0 % Final  . MCV 05/17/2018 94.7  78.0 - 100.0 fL Final  . MCH 05/17/2018 30.4  26.0 - 34.0 pg Final  . MCHC 05/17/2018 32.1  30.0 - 36.0 g/dL Final  . RDW 05/17/2018 12.9  11.5 - 15.5 % Final  . Platelets 05/17/2018 251  150 - 400 K/uL Final   Performed at [facility], [place] [address] [place], [place] [address]  . Sodium 05/17/2018 143  135 - 145 mmol/L Final  . Potassium 05/17/2018 3.8  3.5 - 5.1 mmol/L Final  . Chloride 05/17/2018 108  101 - 111 mmol/L Final  . CO2 05/17/2018 28  22 - 32 mmol/L Final  . Glucose, Bld 05/17/2018 122* 65 - 99 mg/dL Final  . BUN 05/17/2018 6  6 - 20 mg/dL Final  . Creatinine, Ser 05/17/2018 0.68  0.44 - 1.00 mg/dL Final  . Calcium 05/17/2018 8.5* 8.9 - 10.3 mg/dL Final  . GFR calc non Af Amer 05/17/2018 >60  >60 mL/min Final  . GFR calc Af Amer 05/17/2018 >60  >60 mL/min Final   Comment: (NOTE) The eGFR has been calculated using the CKD EPI  equation. This calculation has not been validated in all clinical situations. eGFR's persistently <60 mL/min signify possible Chronic Kidney Disease.   Georgiann Hahn gap 05/17/2018 7  5 - 15 Final   Performed at [facility], [place] [address] [place], [place] [address]  Hospital Outpatient Visit on 05/13/2018  Component Date Value Ref Range Status  . aPTT 05/13/2018 30  24 - 36 seconds Final   Performed at [facility], [place] [address], [place], [place] [address]  . WBC 05/13/2018 6.6  4.0 - 10.5 K/uL Final  . RBC 05/13/2018 4.04  3.87 - 5.11 MIL/uL Final  . Hemoglobin 05/13/2018 12.5  12.0 - 15.0 g/dL Final  . HCT 05/13/2018 37.8  36.0 - 46.0 % Final  .  MCV 05/13/2018 93.6  78.0 - 100.0 fL Final  . MCH 05/13/2018 30.9  26.0 - 34.0 pg Final  . MCHC 05/13/2018 33.1  30.0 - 36.0 g/dL Final  . RDW 05/13/2018 12.3  11.5 - 15.5 % Final  . Platelets 05/13/2018 285  150 - 400 K/uL Final   Performed at [facility], [place] [address], [place], [place] [address]  . Sodium 05/13/2018 142  135 - 145 mmol/L Final  . Potassium 05/13/2018 4.4  3.5 - 5.1 mmol/L Final  . Chloride 05/13/2018 107  101 - 111 mmol/L Final  . CO2 05/13/2018 28  22 - 32 mmol/L Final  . Glucose, Bld 05/13/2018 92  65 - 99 mg/dL Final  . BUN 05/13/2018 11  6 - 20 mg/dL Final  . Creatinine, Ser 05/13/2018 0.71  0.44 - 1.00 mg/dL Final  . Calcium 05/13/2018 9.3  8.9 - 10.3 mg/dL Final  . Total Protein 05/13/2018 7.1  6.5 - 8.1 g/dL Final  . Albumin 05/13/2018 3.8  3.5 - 5.0 g/dL Final  . AST 05/13/2018 20  15 - 41 U/L Final  . ALT 05/13/2018 17  14 - 54 U/L Final  . Alkaline Phosphatase 05/13/2018 105  38 - 126 U/L Final  . Total Bilirubin 05/13/2018 0.4  0.3 - 1.2 mg/dL Final  . GFR calc non Af Amer 05/13/2018 >60  >60 mL/min Final  . GFR calc Af Amer 05/13/2018 >60  >60 mL/min Final   Comment: (NOTE) The eGFR has been calculated using the CKD EPI equation. This calculation has not been validated in all clinical situations. eGFR's persistently <60 mL/min signify possible Chronic Kidney Disease.   Georgiann Hahn gap 05/13/2018 7  5 - 15 Final   Performed at [facility], [place] [address] [place], [place] [address]  . Prothrombin Time 05/13/2018 12.9  11.4 - 15.2 seconds Final  . INR 05/13/2018 0.98   Final   Performed at [facility], [place] [address], [place], [place] [address]  . ABO/RH(D) 05/13/2018 B POS   Final  . Antibody Screen 05/13/2018 NEG   Final  . Sample Expiration 05/13/2018 05/19/2018   Final  . Extend sample reason 05/13/2018    Final                   Value:NO TRANSFUSIONS OR PREGNANCY IN THE PAST 3  MONTHS Performed at [facility], [place] [address] [place],  [address]   . Color, Urine 05/13/2018 YELLOW  YELLOW Final  . APPearance 05/13/2018 CLEAR  CLEAR Final  . Specific Gravity, Urine 05/13/2018 1.017  1.005 - 1.030 Final  . pH 05/13/2018 6.0  5.0 - 8.0 Final  .  Glucose, UA 05/13/2018 NEGATIVE  NEGATIVE mg/dL Final  . Hgb urine dipstick 05/13/2018 NEGATIVE  NEGATIVE Final  . Bilirubin Urine 05/13/2018 NEGATIVE  NEGATIVE Final  . Ketones, ur 05/13/2018 NEGATIVE  NEGATIVE mg/dL Final  . Protein, ur 05/13/2018 NEGATIVE  NEGATIVE mg/dL Final  . Nitrite 05/13/2018 NEGATIVE  NEGATIVE Final  . Leukocytes, UA 05/13/2018 NEGATIVE  NEGATIVE Final   Performed at [facility] [address] [place], [place] [address]  . MRSA, PCR 05/13/2018 NEGATIVE  NEGATIVE Final  . Staphylococcus aureus 05/13/2018 NEGATIVE  NEGATIVE Final   Comment: (NOTE) The Xpert SA Assay (FDA approved for NASAL specimens in patients 32 years of age and older), is one component of a comprehensive surveillance program. It is not intended to diagnose infection nor to guide or monitor treatment. Performed at [facility], [place] [address] [place], [place] [address]      X-Rays:Ct Abdomen Pelvis W Contrast  Result Date: 04/22/2018 CLINICAL DATA:  Abdominal and pelvic pain x2 weeks with diarrhea, gas and nausea. History of appendectomy, cholecystectomy, hysterectomy and umbilical hernia repair. EXAM: CT ABDOMEN AND PELVIS WITH CONTRAST TECHNIQUE: Multidetector CT imaging of the abdomen and pelvis was performed using the standard protocol following bolus administration of intravenous contrast. CONTRAST:  156m ISOVUE-300 IOPAMIDOL (ISOVUE-300) INJECTION 61% COMPARISON:  Radiograph of the abdomen from 04/20/2018. FINDINGS: Lower chest: No acute abnormality. Hepatobiliary: No focal liver abnormality is seen. Status post cholecystectomy. No biliary dilatation.  Pancreas: Unremarkable. No pancreatic ductal dilatation or surrounding inflammatory changes. Spleen: Normal in size without focal abnormality. 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 No enhancing mass lesions, nephrolithiasis nor hydroureteronephrosis. The urinary bladder is unremarkable. Stomach/Bowel: Physiologic distention of the stomach with normal small bowel rotation. No bowel obstruction or small-bowel inflammation. Status post appendectomy. Slight thickening of the cecum may be due to underdistention. Average amount of fecal retention admixed with contrast noted in the ascending through descending colon with moderate amount of stool in the rectosigmoid. Vascular/Lymphatic: No significant vascular findings are present. No enlarged abdominal or pelvic lymph nodes. Reproductive: Status post hysterectomy. No adnexal masses. Other: Small fat containing bilateral inguinal hernias. No abdominopelvic ascites. Musculoskeletal: Left total hip arthroplasty without complicating features. Interbody cages noted at L4-5 with mild levoconvex curvature of the lumbar spine. No aggressive nor acute osseous abnormality. IMPRESSION: 1. No acute bowel obstruction. Slight thickened appearance of the cecum without focal mass may be due to underdistention. Mild right-sided colitis is believed less likely though not entirely excluded. Moderate fecal retention within redundant sigmoid colon extending to the rectum, query constipation. 2. 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 3. 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 L4-5 interbody cage. Electronically Signed   By: DAshley RoyaltyM.D.   On: 04/22/2018 16:01   Dg Abd 2 Views  Result Date: 04/21/2018 CLINICAL DATA:  Lower abdominal pain EXAM: ABDOMEN - 2 VIEW COMPARISON:  None.  FINDINGS: Scattered large and small bowel gas is noted. No free air is seen. No obstructive changes are noted. Postsurgical changes are noted. Multilevel degenerative changes in the lumbar spine are seen. IMPRESSION: Chronic changes without acute abnormality. Electronically Signed   By: MInez CatalinaM.D.   On: 04/21/2018 08:32    EKG: Orders placed or performed during the hospital encounter of  04/13/12  . EKG     Hospital Course: Christine [name] is a 67 y.o. who was admitted to [facility] They were brought to the operating room on 05/16/2018 and underwent Procedure(s): LEFT TOTAL KNEE ARTHROPLASTY.  Patient tolerated the procedure well and was later transferred to the recovery room and then to the orthopaedic floor for postoperative care.  They were given PO and IV analgesics for pain control following their surgery.  They were given 24 hours of postoperative antibiotics of  Anti-infectives (From admission, onward)   Start     Dose/Rate Route Frequency Ordered Stop   05/16/18 2230  vancomycin (VANCOCIN) IVPB 1000 mg/200 mL premix     1,000 mg 200 mL/hr over 60 Minutes Intravenous Every 12 hours 05/16/18 1347 05/17/18 0123   05/16/18 0830  ceFAZolin (ANCEF) IVPB 2g/100 mL premix  Status:  Discontinued     2 g 200 mL/hr over 30 Minutes Intravenous On call to O.R. 05/16/18 0815 05/16/18 0819   05/16/18 0815  vancomycin (VANCOCIN) IVPB 1000 mg/200 mL premix     1,000 mg 200 mL/hr over 60 Minutes Intravenous On call to O.R. 05/16/18 0813 05/16/18 1055     and started on DVT prophylaxis in the form of Aspirin.   PT and OT were ordered for total joint protocol.  Discharge planning consulted to help with postop disposition and equipment needs.  Patient had a good night on the evening of surgery.  They started to get up OOB with therapy on day one. Hemovac drain was pulled without difficulty. Patient was seen in rounds and was ready to go home. Continued to work with therapy on POD #1 and  by her second session patient was meeting all of her goals and was stable for discharge. Instructed patient to change dressing on POD #2 and that she may shower on POD #3. Patient was discharged to home in stable condition.  Diet: Regular diet Activity:WBAT Follow-up:in 2 weeks Disposition - Home with outpatient physical therapy Discharged Condition: stable   Discharge Instructions    Call MD / Call 911   Complete by:  As directed    If you experience chest pain or shortness of breath, CALL 911 and be transported to the hospital emergency room.  If you develope a fever above 101 F, pus (white drainage) or increased drainage or redness at the wound, or calf pain, call your surgeon's office.   Change dressing   Complete by:  As directed    Change dressing on Wednesday (05/18/18), then change the dressing daily with sterile 4 x 4 inch gauze dressing and apply TED hose.  You may clean the incision with alcohol prior to redressing.   Constipation Prevention   Complete by:  As directed    Drink plenty of fluids.  Prune juice may be helpful.  You may use a stool softener, such as Colace (over the counter) 100 mg twice a day.  Use MiraLax (over the counter) for constipation as needed.   Diet - low sodium heart healthy   Complete by:  As directed    Discharge instructions   Complete by:  As directed    Dr. Gaynelle Arabian Total Joint Specialist [facility] [address], [place], [place] [address] [phone]  TOTAL KNEE REPLACEMENT POSTOPERATIVE DIRECTIONS  Knee Rehabilitation, Guidelines Following Surgery  Results after knee surgery are often greatly improved when you follow the exercise, range of motion and muscle strengthening exercises prescribed by your doctor. Safety  measures are also important to protect the knee from further injury. Any time any of these exercises cause you to have increased pain or swelling in your knee joint, decrease the amount until you are  comfortable again and slowly increase them. If you have problems or questions, call your caregiver or physical therapist for advice.   HOME CARE INSTRUCTIONS  Remove items at home which could result in a fall. This includes throw rugs or furniture in walking pathways.  ICE to the affected knee every three hours for 30 minutes at a time and then as needed for pain and swelling.  Continue to use ice on the knee for pain and swelling from surgery. You may notice swelling that will progress down to the foot and ankle.  This is normal after surgery.  Elevate the leg when you are not up walking on it.   Continue to use the breathing machine which will help keep your temperature down.  It is common for your temperature to cycle up and down following surgery, especially at night when you are not up moving around and exerting yourself.  The breathing machine keeps your lungs expanded and your temperature down. Do not place pillow under knee, focus on keeping the knee straight while resting   DIET You may resume your previous home diet once your are discharged from the hospital.  DRESSING / WOUND CARE / SHOWERING You may shower 3 days after surgery, but keep the wounds dry during showering.  You may use an occlusive plastic wrap (Press'n Seal for example), NO SOAKING/SUBMERGING IN THE BATHTUB.  If the bandage gets wet, change with a clean dry gauze.  If the incision gets wet, pat the wound dry with a clean towel. You may start showering once you are discharged home but do not submerge the incision under water. Just pat the incision dry and apply a dry gauze dressing on daily. Change the surgical dressing daily and reapply a dry dressing each time.  ACTIVITY Walk with your walker as instructed. Use walker as long as suggested by your caregivers. Avoid periods of inactivity such as sitting longer than an hour when not asleep. This helps prevent blood clots.  You may resume a sexual relationship in one month  or when given the OK by your doctor.  You may return to work once you are cleared by your doctor.  Do not drive a car for 6 weeks or until released by you surgeon.  Do not drive while taking narcotics.  WEIGHT BEARING Weight bearing as tolerated with assist device (walker, cane, etc) as directed, use it as long as suggested by your surgeon or therapist, typically at least 4-6 weeks.  POSTOPERATIVE CONSTIPATION PROTOCOL Constipation - defined medically as fewer than three stools per week and severe constipation as less than one stool per week.  One of the most common issues patients have following surgery is constipation.  Even if you have a regular bowel pattern at home, your normal regimen is likely to be disrupted due to multiple reasons following surgery.  Combination of anesthesia, postoperative narcotics, change in appetite and fluid intake all can affect your bowels.  In order to avoid complications following surgery, here are some recommendations in order to help you during your recovery period.  Colace (docusate) - Pick up an over-the-counter form of Colace or another stool softener and take twice a day as long as you are requiring postoperative pain medications.  Take with a full glass of water daily.  If you experience loose stools or diarrhea, hold the colace until you stool forms back up.  If your symptoms do not get better within 1 week or if they get worse, check with your doctor.  Dulcolax (bisacodyl) - Pick up over-the-counter and take as directed by the product packaging as needed to assist with the movement of your bowels.  Take with a full glass of water.  Use this product as needed if not relieved by Colace only.   MiraLax (polyethylene glycol) - Pick up over-the-counter to have on hand.  MiraLax is a solution that will increase the amount of water in your bowels to assist with bowel movements.  Take as directed and can mix with a glass of water, juice, soda, coffee, or tea.   Take if you go more than two days without a movement. Do not use MiraLax more than once per day. Call your doctor if you are still constipated or irregular after using this medication for 7 days in a row.  If you continue to have problems with postoperative constipation, please contact the office for further assistance and recommendations.  If you experience "the worst abdominal pain ever" or develop nausea or vomiting, please contact the office immediatly for further recommendations for treatment.  ITCHING  If you experience itching with your medications, try taking only a single pain pill, or even half a pain pill at a time.  You can also use Benadryl over the counter for itching or also to help with sleep.   TED HOSE STOCKINGS Wear the elastic stockings on both legs for three weeks following surgery during the day but you may remove then at night for sleeping.  MEDICATIONS See your medication summary on the "After Visit Summary" that the nursing staff will review with you prior to discharge.  You may have some home medications which will be placed on hold until you complete the course of blood thinner medication.  It is important for you to complete the blood thinner medication as prescribed by your surgeon.  Continue your approved medications as instructed at time of discharge.  PRECAUTIONS If you experience chest pain or shortness of breath - call 911 immediately for transfer to the hospital emergency department.  If you develop a fever greater that 101 F, purulent drainage from wound, increased redness or drainage from wound, foul odor from the wound/dressing, or calf pain - CONTACT YOUR SURGEON.                                                   FOLLOW-UP APPOINTMENTS Make sure you keep all of your appointments after your operation with your surgeon and caregivers. You should call the office at the above phone number and make an appointment for approximately two weeks after the date of your  surgery or on the date instructed by your surgeon outlined in the "After Visit Summary".   RANGE OF MOTION AND STRENGTHENING EXERCISES  Rehabilitation of the knee is important following a knee injury or an operation. After just a few days of immobilization, the muscles of the thigh which control the knee become weakened and shrink (atrophy). Knee exercises are designed to build up the tone and strength of the thigh muscles and to improve knee motion. Often times heat used for twenty to thirty minutes before working out will loosen up your  tissues and help with improving the range of motion but do not use heat for the first two weeks following surgery. These exercises can be done on a training (exercise) mat, on the floor, on a table or on a bed. Use what ever works the best and is most comfortable for you Knee exercises include:  Leg Lifts - While your knee is still immobilized in a splint or cast, you can do straight leg raises. Lift the leg to 60 degrees, hold for 3 sec, and slowly lower the leg. Repeat 10-20 times 2-3 times daily. Perform this exercise against resistance later as your knee gets better.  Quad and Hamstring Sets - Tighten up the muscle on the front of the thigh (Quad) and hold for 5-10 sec. Repeat this 10-20 times hourly. Hamstring sets are done by pushing the foot backward against an object and holding for 5-10 sec. Repeat as with quad sets.  Leg Slides: Lying on your back, slowly slide your foot toward your buttocks, bending your knee up off the floor (only go as far as is comfortable). Then slowly slide your foot back down until your leg is flat on the floor again. Angel Wings: Lying on your back spread your legs to the side as far apart as you can without causing discomfort.  A rehabilitation program following serious knee injuries can speed recovery and prevent re-injury in the future due to weakened muscles. Contact your doctor or a physical therapist for more information on knee  rehabilitation.   IF YOU ARE TRANSFERRED TO A SKILLED REHAB FACILITY If the patient is transferred to a skilled rehab facility following release from the hospital, a list of the current medications will be sent to the facility for the patient to continue.  When discharged from the skilled rehab facility, please have the facility set up the patient's [place] prior to being released. Also, the skilled facility will be responsible for providing the patient with their medications at time of release from the facility to include their pain medication, the muscle relaxants, and their blood thinner medication. If the patient is still at the rehab facility at time of the two week follow up appointment, the skilled rehab facility will also need to assist the patient in arranging follow up appointment in our office and any transportation needs.  MAKE SURE YOU:  Understand these instructions.  Get help right away if you are not doing well or get worse.    Pick up stool softner and laxative for home use following surgery while on pain medications. Do not submerge incision under water. Please use good hand washing techniques while changing dressing each day. May shower starting three days after surgery. Please use a clean towel to pat the incision dry following showers. Continue to use ice for pain and swelling after surgery. Do not use any lotions or creams on the incision until instructed by your surgeon.   Do not put a pillow under the knee. Place it under the heel.   Complete by:  As directed    Driving restrictions   Complete by:  As directed    No driving for two weeks   TED hose   Complete by:  As directed    Use stockings (TED hose) for three weeks on both leg(s).  You may remove them at night for sleeping.   Weight bearing as tolerated   Complete by:  As directed      Allergies as of 05/17/2018  Reactions   Other Nausea Only   "most pain medications cause really bad  nausea."    Hydrocodone Other (See Comments)   headaches   Amoxicillin Nausea Only   Codeine Nausea Only   Dilaudid [hydromorphone Hcl] Itching   Keflex [cephalexin] Itching   Nickel Itching, Rash   Sulfa Antibiotics Rash      Medication List    STOP taking these medications   etodolac 500 MG tablet Commonly known as:  LODINE     TAKE these medications   aspirin 325 MG EC tablet Take 1 tablet (325 mg total) by mouth 2 (two) times daily for 20 days. Take one tablet twice a day for 3 weeks following surgery to prevent blood clots. Then take one baby aspirin (81 mg) once a day for three weeks. Then discontinue aspirin.   dicyclomine 20 MG tablet Commonly known as:  BENTYL Take 1 tablet by mouth 4 (four) times daily as needed for spasms.   DULoxetine 60 MG capsule Commonly known as:  CYMBALTA Take 60 mg by mouth every morning.   estradiol 1 MG tablet Commonly known as:  ESTRACE Take 1 mg by mouth daily.   gabapentin 300 MG capsule Commonly known as:  NEURONTIN Take 1 capsule (300 mg total) by mouth 3 (three) times daily. Gabapentin 300 mg Protocol Take a 300 mg capsule three times a day for two weeks following surgery. Then take a 300 mg capsule two times a day for two weeks.  Then take a 300 mg capsule once a day for two weeks.  Then discontinue the Gabapentin.   methocarbamol 500 MG tablet Commonly known as:  ROBAXIN Take 1 tablet (500 mg total) by mouth every 6 (six) hours as needed for muscle spasms.   ondansetron 4 MG tablet Commonly known as:  ZOFRAN Take 1 tablet (4 mg total) by mouth every 6 (six) hours as needed for nausea.   oxyCODONE 5 MG immediate release tablet Commonly known as:  Oxy IR/ROXICODONE Take 1-2 tablets (5-10 mg total) by mouth every 6 (six) hours as needed for moderate pain or severe pain.   traMADol 50 MG tablet Commonly known as:  ULTRAM Take 1-2 tablets (50-100 mg total) by mouth every 6 (six) hours as needed for moderate pain (use  oxycodone first).   Vitamin D 2000 units tablet Take 2,000 Units by mouth daily.            Discharge Care Instructions  (From admission, onward)        Start     Ordered   05/17/18 0000  Weight bearing as tolerated     05/17/18 1410   05/17/18 0000  Change dressing    Comments:  Change dressing on Wednesday (05/18/18), then change the dressing daily with sterile 4 x 4 inch gauze dressing and apply TED hose.  You may clean the incision with alcohol prior to redressing.   05/17/18 1410     Follow-up Information    Gaynelle Arabian, MD Follow up in 2 week(s).   Specialty:  Orthopedic Surgery Contact information: [address] [place] [address] [phone]           Signed: Theresa Duty, PA-C Orthopaedic Surgery 05/18/2018, 1:53 PM

## 2018-05-23 DIAGNOSIS — Z471 Aftercare following joint replacement surgery: Secondary | ICD-10-CM | POA: Diagnosis not present

## 2018-05-23 DIAGNOSIS — Z96652 Presence of left artificial knee joint: Secondary | ICD-10-CM | POA: Diagnosis not present

## 2018-05-23 DIAGNOSIS — R2689 Other abnormalities of gait and mobility: Secondary | ICD-10-CM | POA: Diagnosis not present

## 2018-05-23 DIAGNOSIS — M1712 Unilateral primary osteoarthritis, left knee: Secondary | ICD-10-CM | POA: Diagnosis not present

## 2018-05-25 DIAGNOSIS — R2689 Other abnormalities of gait and mobility: Secondary | ICD-10-CM | POA: Diagnosis not present

## 2018-05-25 DIAGNOSIS — M1712 Unilateral primary osteoarthritis, left knee: Secondary | ICD-10-CM | POA: Diagnosis not present

## 2018-05-25 DIAGNOSIS — Z96652 Presence of left artificial knee joint: Secondary | ICD-10-CM | POA: Diagnosis not present

## 2018-05-25 DIAGNOSIS — Z471 Aftercare following joint replacement surgery: Secondary | ICD-10-CM | POA: Diagnosis not present

## 2018-05-27 DIAGNOSIS — Z471 Aftercare following joint replacement surgery: Secondary | ICD-10-CM | POA: Diagnosis not present

## 2018-05-27 DIAGNOSIS — M1712 Unilateral primary osteoarthritis, left knee: Secondary | ICD-10-CM | POA: Diagnosis not present

## 2018-05-27 DIAGNOSIS — R2689 Other abnormalities of gait and mobility: Secondary | ICD-10-CM | POA: Diagnosis not present

## 2018-05-27 DIAGNOSIS — Z96652 Presence of left artificial knee joint: Secondary | ICD-10-CM | POA: Diagnosis not present

## 2018-05-31 DIAGNOSIS — R2689 Other abnormalities of gait and mobility: Secondary | ICD-10-CM | POA: Diagnosis not present

## 2018-05-31 DIAGNOSIS — Z96652 Presence of left artificial knee joint: Secondary | ICD-10-CM | POA: Diagnosis not present

## 2018-05-31 DIAGNOSIS — M1712 Unilateral primary osteoarthritis, left knee: Secondary | ICD-10-CM | POA: Diagnosis not present

## 2018-05-31 DIAGNOSIS — Z471 Aftercare following joint replacement surgery: Secondary | ICD-10-CM | POA: Diagnosis not present

## 2018-06-01 DIAGNOSIS — Z96652 Presence of left artificial knee joint: Secondary | ICD-10-CM | POA: Diagnosis not present

## 2018-06-01 DIAGNOSIS — Z471 Aftercare following joint replacement surgery: Secondary | ICD-10-CM | POA: Diagnosis not present

## 2018-06-01 DIAGNOSIS — R2689 Other abnormalities of gait and mobility: Secondary | ICD-10-CM | POA: Diagnosis not present

## 2018-06-01 DIAGNOSIS — M1712 Unilateral primary osteoarthritis, left knee: Secondary | ICD-10-CM | POA: Diagnosis not present

## 2018-06-03 DIAGNOSIS — M1712 Unilateral primary osteoarthritis, left knee: Secondary | ICD-10-CM | POA: Diagnosis not present

## 2018-06-03 DIAGNOSIS — Z96652 Presence of left artificial knee joint: Secondary | ICD-10-CM | POA: Diagnosis not present

## 2018-06-03 DIAGNOSIS — R2689 Other abnormalities of gait and mobility: Secondary | ICD-10-CM | POA: Diagnosis not present

## 2018-06-03 DIAGNOSIS — Z471 Aftercare following joint replacement surgery: Secondary | ICD-10-CM | POA: Diagnosis not present

## 2018-06-06 DIAGNOSIS — Z96652 Presence of left artificial knee joint: Secondary | ICD-10-CM | POA: Diagnosis not present

## 2018-06-06 DIAGNOSIS — R2689 Other abnormalities of gait and mobility: Secondary | ICD-10-CM | POA: Diagnosis not present

## 2018-06-06 DIAGNOSIS — M1712 Unilateral primary osteoarthritis, left knee: Secondary | ICD-10-CM | POA: Diagnosis not present

## 2018-06-06 DIAGNOSIS — Z471 Aftercare following joint replacement surgery: Secondary | ICD-10-CM | POA: Diagnosis not present

## 2018-06-08 DIAGNOSIS — Z471 Aftercare following joint replacement surgery: Secondary | ICD-10-CM | POA: Diagnosis not present

## 2018-06-08 DIAGNOSIS — M1712 Unilateral primary osteoarthritis, left knee: Secondary | ICD-10-CM | POA: Diagnosis not present

## 2018-06-08 DIAGNOSIS — Z96652 Presence of left artificial knee joint: Secondary | ICD-10-CM | POA: Diagnosis not present

## 2018-06-08 DIAGNOSIS — R2689 Other abnormalities of gait and mobility: Secondary | ICD-10-CM | POA: Diagnosis not present

## 2018-06-13 DIAGNOSIS — M1712 Unilateral primary osteoarthritis, left knee: Secondary | ICD-10-CM | POA: Diagnosis not present

## 2018-06-13 DIAGNOSIS — Z471 Aftercare following joint replacement surgery: Secondary | ICD-10-CM | POA: Diagnosis not present

## 2018-06-13 DIAGNOSIS — Z96652 Presence of left artificial knee joint: Secondary | ICD-10-CM | POA: Diagnosis not present

## 2018-06-13 DIAGNOSIS — R2689 Other abnormalities of gait and mobility: Secondary | ICD-10-CM | POA: Diagnosis not present

## 2018-06-14 DIAGNOSIS — R2689 Other abnormalities of gait and mobility: Secondary | ICD-10-CM | POA: Diagnosis not present

## 2018-06-14 DIAGNOSIS — M1712 Unilateral primary osteoarthritis, left knee: Secondary | ICD-10-CM | POA: Diagnosis not present

## 2018-06-14 DIAGNOSIS — Z96652 Presence of left artificial knee joint: Secondary | ICD-10-CM | POA: Diagnosis not present

## 2018-06-14 DIAGNOSIS — Z471 Aftercare following joint replacement surgery: Secondary | ICD-10-CM | POA: Diagnosis not present

## 2018-06-17 DIAGNOSIS — M1712 Unilateral primary osteoarthritis, left knee: Secondary | ICD-10-CM | POA: Diagnosis not present

## 2018-06-17 DIAGNOSIS — R2689 Other abnormalities of gait and mobility: Secondary | ICD-10-CM | POA: Diagnosis not present

## 2018-06-17 DIAGNOSIS — Z96652 Presence of left artificial knee joint: Secondary | ICD-10-CM | POA: Diagnosis not present

## 2018-06-17 DIAGNOSIS — Z471 Aftercare following joint replacement surgery: Secondary | ICD-10-CM | POA: Diagnosis not present

## 2018-06-20 DIAGNOSIS — Z471 Aftercare following joint replacement surgery: Secondary | ICD-10-CM | POA: Diagnosis not present

## 2018-06-20 DIAGNOSIS — Z96652 Presence of left artificial knee joint: Secondary | ICD-10-CM | POA: Diagnosis not present

## 2018-06-20 DIAGNOSIS — R2689 Other abnormalities of gait and mobility: Secondary | ICD-10-CM | POA: Diagnosis not present

## 2018-06-20 DIAGNOSIS — M1712 Unilateral primary osteoarthritis, left knee: Secondary | ICD-10-CM | POA: Diagnosis not present

## 2018-06-21 DIAGNOSIS — Z96651 Presence of right artificial knee joint: Secondary | ICD-10-CM | POA: Diagnosis not present

## 2018-06-21 DIAGNOSIS — Z471 Aftercare following joint replacement surgery: Secondary | ICD-10-CM | POA: Diagnosis not present

## 2018-07-01 DIAGNOSIS — R300 Dysuria: Secondary | ICD-10-CM | POA: Diagnosis not present

## 2018-07-05 DIAGNOSIS — R140 Abdominal distension (gaseous): Secondary | ICD-10-CM | POA: Diagnosis not present

## 2018-07-05 DIAGNOSIS — Z09 Encounter for follow-up examination after completed treatment for conditions other than malignant neoplasm: Secondary | ICD-10-CM | POA: Diagnosis not present

## 2018-07-05 DIAGNOSIS — R194 Change in bowel habit: Secondary | ICD-10-CM | POA: Diagnosis not present

## 2018-07-05 DIAGNOSIS — G4709 Other insomnia: Secondary | ICD-10-CM | POA: Diagnosis not present

## 2018-09-01 DIAGNOSIS — G5602 Carpal tunnel syndrome, left upper limb: Secondary | ICD-10-CM | POA: Diagnosis not present

## 2018-09-01 DIAGNOSIS — M13842 Other specified arthritis, left hand: Secondary | ICD-10-CM | POA: Diagnosis not present

## 2018-09-08 DIAGNOSIS — I10 Essential (primary) hypertension: Secondary | ICD-10-CM | POA: Diagnosis not present

## 2018-09-08 DIAGNOSIS — K219 Gastro-esophageal reflux disease without esophagitis: Secondary | ICD-10-CM | POA: Diagnosis not present

## 2018-09-08 DIAGNOSIS — I200 Unstable angina: Secondary | ICD-10-CM | POA: Diagnosis not present

## 2018-09-12 ENCOUNTER — Ambulatory Visit: Payer: PPO | Admitting: Cardiology

## 2018-09-12 DIAGNOSIS — I10 Essential (primary) hypertension: Secondary | ICD-10-CM | POA: Diagnosis not present

## 2018-09-12 DIAGNOSIS — G5602 Carpal tunnel syndrome, left upper limb: Secondary | ICD-10-CM | POA: Diagnosis not present

## 2018-09-12 DIAGNOSIS — R079 Chest pain, unspecified: Secondary | ICD-10-CM | POA: Diagnosis not present

## 2018-09-12 DIAGNOSIS — Z23 Encounter for immunization: Secondary | ICD-10-CM | POA: Diagnosis not present

## 2018-09-12 DIAGNOSIS — K219 Gastro-esophageal reflux disease without esophagitis: Secondary | ICD-10-CM | POA: Diagnosis not present

## 2018-09-27 DIAGNOSIS — G5602 Carpal tunnel syndrome, left upper limb: Secondary | ICD-10-CM | POA: Diagnosis not present

## 2018-10-17 DIAGNOSIS — G5602 Carpal tunnel syndrome, left upper limb: Secondary | ICD-10-CM | POA: Diagnosis not present

## 2018-11-01 DIAGNOSIS — G5602 Carpal tunnel syndrome, left upper limb: Secondary | ICD-10-CM | POA: Diagnosis not present

## 2018-11-01 DIAGNOSIS — Z5189 Encounter for other specified aftercare: Secondary | ICD-10-CM | POA: Diagnosis not present

## 2018-11-08 ENCOUNTER — Ambulatory Visit: Payer: PPO | Admitting: Internal Medicine

## 2018-11-08 ENCOUNTER — Encounter: Payer: Self-pay | Admitting: Internal Medicine

## 2018-11-08 VITALS — BP 122/80 | HR 82 | Ht 65.0 in | Wt 177.4 lb

## 2018-11-08 DIAGNOSIS — R0609 Other forms of dyspnea: Secondary | ICD-10-CM | POA: Diagnosis not present

## 2018-11-08 DIAGNOSIS — R0789 Other chest pain: Secondary | ICD-10-CM

## 2018-11-08 MED ORDER — METOPROLOL TARTRATE 100 MG PO TABS: ORAL_TABLET | ORAL | 0 refills | Status: AC

## 2018-11-08 NOTE — Progress Notes (Signed)
Cardiology Office Note:    Date:  11/09/2018   ID:  JNAI SNELLGROVE, DOB January 13, 1951, MRN [id number]  PCP:  Lanice Shirts, MD  Cardiologist:  No primary care provider on file.  Electrophysiologist:  None   Referring MD: Lanice Shirts, *   Chest pain and dyspnea on exertion  History of Present Illness:    Christine [name] is a 67 y.o. female with a hx of hyperlipidemia, hypertension, and GERD, as well as a family history of heart disease, who presents today for evaluation of an episode of chest pain in October, and continued atypical chest pain 4/7 days of the week.  She is very active, but has had several orthopedic limitations in the recent past requiring surgical management.  On approximately October 12 she had an episode of chest discomfort primarily on the right side with dizziness and diaphoresis she had had some chest discomfort preceding this episode she has a known history of hypertension, and recalled that her blood pressure was quite elevated.  She believes that her episode of chest discomfort, dizziness, diaphoresis was likely related to hypertension.  She has been taking lisinopril 10 mg daily and feels that her issues with hypertension are largely improved.  She remains active cleaning her house and doing yard work, but she does note that given her multiple surgeries in the recent past she is more short of breath with activity, which she attributes to being out of shape.  She notes that she is tremendously short of breath at the top of 3 flights of stairs, and will occasionally have her right-sided chest discomfort with this activity.  Primarily she has chest pain on the right side of her chest wall radiating to her right jaw and right arm at night when she is resting in bed.  The patient denies palpitations, PND, orthopnea, or leg swelling. Denies syncope or presyncope. Denies dizziness or lightheadedness. Denies frequent snoring and has not be evaluated for  sleep apnea.  Epworth Sleepiness Scale score of 6.  Family history significant for her father who had congestive heart failure and had an early MI, mother with a permanent pacemaker.  There is no family history of sudden cardiac death.  Her sister has heart disease but she does not recall exactly what this entails.  She smoked greater than 100 cigarettes in her lifetime but is not a current smoker.  Past Medical History:  Diagnosis Date  . Anemia 2012   hx of   . Arthritis    "q joint in my body; it's eating me up" (07/27/2012)  . Blood transfusion 1970's   bleeding  after a fertility exploratory surgery   . Bronchitis    "once"  . Chronic lower back pain 07/27/2012   "they said I need another fusion right now"  . Complication of anesthesia 1990's   "didn't have me totally asleep when they put ETT in; very traumatic"  . Depression   . GERD (gastroesophageal reflux disease)    not on prescription meds  . High cholesterol   . IBS (irritable bowel syndrome)    recently diagnosed; no current problems  . OA (osteoarthritis)   . PONV (postoperative nausea and vomiting)   . Spinal headache    after spinal anesthetic; has had blood patch  . Urinary tract infection    hx of     Past Surgical History:  Procedure Laterality Date  . APPENDECTOMY  ` 1964  . BACK SURGERY    . BREAST  SURGERY     "total of 4 tumors removed; both breasts"  . CESAREAN SECTION  1987  . CHOLECYSTECTOMY  ~ 1990  . DIAGNOSTIC LAPAROSCOPY  1970's   "checking fertility; checking everything out"  . DILATION AND CURETTAGE OF UTERUS  ~ 1989  . HAMMER TOE SURGERY     right; "twice"  . HAND SURGERY  ~ 1999   left thrumb  joint; "spur removal; rerouting of tendon"  . HERNIA REPAIR  7106   umbilical  . NASAL SEPTUM SURGERY    . POSTERIOR FUSION LUMBAR SPINE  ~ 2002  . TONSILLECTOMY AND ADENOIDECTOMY     "as a child"  . TOTAL HIP ARTHROPLASTY  07/27/2012   left  . TOTAL HIP ARTHROPLASTY  07/27/2012   Procedure:  TOTAL HIP ARTHROPLASTY;  Surgeon: Ninetta Lights, MD;  Location: [facility];  Service: Orthopedics;  Laterality: Left;  Left TOTAL HIP   . TOTAL HIP REVISION Left 01/23/2015   Procedure: LEFT HIP ACETABULAR  REVISION;  Surgeon: Gearlean Alf, MD;  Location: [facility];  Service: Orthopedics;  Laterality: Left;  . TOTAL KNEE ARTHROPLASTY  04/13/2012   Procedure: TOTAL KNEE ARTHROPLASTY;  Surgeon: Ninetta Lights, MD;  Location: [facility];  Service: Orthopedics;  Laterality: Right;  DR MURPHY WANTS 90 MINUTES FOR THIS CASE  . TOTAL KNEE ARTHROPLASTY Left 05/16/2018   Procedure: LEFT TOTAL KNEE ARTHROPLASTY;  Surgeon: Gaynelle Arabian, MD;  Location: [facility];  Service: Orthopedics;  Laterality: Left;  . TUBAL LIGATION  ~ 1990  . VAGINAL HYSTERECTOMY  1989    Current Medications: Current Meds  Medication Sig  . Cholecalciferol (VITAMIN D) 2000 units tablet Take 2,000 Units by mouth daily.  . DULoxetine (CYMBALTA) 60 MG capsule Take 60 mg by mouth every morning.   Marland Kitchen estradiol (ESTRACE) 1 MG tablet Take 1 mg by mouth daily.  Marland Kitchen lisinopril (PRINIVIL,ZESTRIL) 10 MG tablet Take 10 mg by mouth daily.  . pantoprazole (PROTONIX) 20 MG tablet Take 20 mg by mouth daily.     Allergies:   Other; Hydrocodone; Amoxicillin; Codeine; Dilaudid [hydromorphone hcl]; Keflex [cephalexin]; Nickel; and Sulfa antibiotics   Social History   Socioeconomic History  . Marital status: Married    Spouse name: Not on file  . Number of children: Not on file  . Years of education: Not on file  . Highest education level: Not on file  Occupational History  . Occupation: Garment/textile technologist  Social Needs  . Financial resource strain: Not on file  . Food insecurity:    Worry: Not on file    Inability: Not on file  . Transportation needs:    Medical: Not on file    Non-medical: Not on file  Tobacco Use  . Smoking status: Former Smoker    Packs/day: 0.12    Years: 3.00    Pack years: 0.36    Types: Cigarettes    Last attempt to quit:  11/30/1973    Years since quitting: 44.9  . Smokeless tobacco: Never Used  Substance and Sexual Activity  . Alcohol use: Yes    Comment: rarely   . Drug use: No  . Sexual activity: Not Currently    Birth control/protection: Surgical  Lifestyle  . Physical activity:    Days per week: Not on file    Minutes per session: Not on file  . Stress: Not on file  Relationships  . Social connections:    Talks on phone: Not on file  Gets together: Not on file    Attends religious service: Not on file    Active member of club or organization: Not on file    Attends meetings of clubs or organizations: Not on file    Relationship status: Not on file  Other Topics Concern  . Not on file  Social History Narrative  . Not on file     Family History: The patient's family history includes Arthritis in her father; Congestive Heart Failure in her father; Diabetes in her mother; Heart disease in her mother. There is no history of Anesthesia problems.  See HPI.  ROS:   Please see the history of present illness.    All other systems reviewed and are negative.  EKGs/Labs/Other Studies Reviewed:    The following studies were reviewed today:  EKG: Normal sinus rhythm, ventricular rate 82 bpm  Recent Labs: 05/13/2018: ALT 17 05/17/2018: BUN 6; Creatinine, Ser 0.68; Hemoglobin 10.3; Platelets 251; Potassium 3.8; Sodium 143  Recent Lipid Panel No results found for: CHOL, TRIG, HDL, CHOLHDL, VLDL, LDLCALC, LDLDIRECT  Physical Exam:    VS:  BP 122/80   Pulse 82   Ht 5\' 5"  (1.651 m)   Wt 177 lb 6.4 oz (80.5 kg)   BMI 29.52 kg/m     Wt Readings from Last 3 Encounters:  11/08/18 177 lb 6.4 oz (80.5 kg)  05/16/18 170 lb (77.1 kg)  05/13/18 170 lb 4 oz (77.2 kg)     Constitutional: No acute distress Eyes: pupils equally round and reactive to light, sclera non-icteric, normal conjunctiva and lids ENMT: normal dentition, moist mucous membranes Cardiovascular: regular rhythm, normal rate, no  murmurs. S1 and S2 normal. Radial pulses normal bilaterally. No jugular venous distention.  Respiratory: clear to auscultation bilaterally GI : normal bowel sounds, soft and nontender. No distention.   MSK: extremities warm, well perfused. No edema.  NEURO: grossly nonfocal exam, moves all extremities. PSYCH: alert and oriented x 3, normal mood and affect.   ASSESSMENT:    1. Atypical chest pain   2. DOE (dyspnea on exertion)    PLAN:   She continues to have atypical chest pain despite reasonably well-controlled blood pressure she is not overly concerned with her symptoms and feels that her shortness of breath is related to being deconditioned and that her chest discomfort is not urgent and worrisome to her.  To thoroughly evaluate her symptoms I have offered her the following: We can perform an echocardiogram for shortness of breath, and a CT coronary angiogram to ensure that her atypical right-sided chest discomfort is not related to a coronary artery anomaly or coronary artery lesion.  We will get a limited view of the ascending aorta for size and any pathology in the field-of-view.  We will see her back in 3 months for follow-up of testing.  No changes to her medications are required at this time.  Medication Adjustments/Labs and Tests Ordered: Current medicines are reviewed at length with the patient today.  Concerns regarding medicines are outlined above.  Orders Placed This Encounter  Procedures  . CT CORONARY MORPH W/CTA COR W/SCORE W/CA W/CM &/OR WO/CM  . CT CORONARY FRACTIONAL FLOW RESERVE DATA PREP  . CT CORONARY FRACTIONAL FLOW RESERVE FLUID ANALYSIS  . Basic metabolic panel  . EKG 12-Lead  . ECHOCARDIOGRAM COMPLETE   Meds ordered this encounter  Medications  . metoprolol tartrate (LOPRESSOR) 100 MG tablet    Sig: Take 1 tablet (100mg ) 2 hours prior to your Coronary CTA  Dispense:  1 tablet    Refill:  0    Patient Instructions  Medication Instructions:  Your  physician recommends that you continue on your current medications as directed. Please refer to the Current Medication list given to you today.  If you need a refill on your cardiac medications before your next appointment, please call your pharmacy.   Lab work: Your physician recommends that you return for lab work 1-2 days prior to your Cor CTA  If you have labs (blood work) drawn today and your tests are completely normal, you will receive your results only by: Marland Kitchen MyChart Message (if you have MyChart) OR . A paper copy in the mail If you have any lab test that is abnormal or we need to change your treatment, we will call you to review the results.  Testing/Procedures: Your physician has requested that you have an echocardiogram. Echocardiography is a painless test that uses sound waves to create images of your heart. It provides your doctor with information about the size and shape of your heart and how well your heart's chambers and valves are working. This procedure takes approximately one hour. There are no restrictions for this procedure.  Your physician has requested that you have cardiac CT. Cardiac computed tomography (CT) is a painless test that uses an x-ray machine to take clear, detailed pictures of your heart. For further information please visit [facility]uge[facility]iesta[facility]tn. Please follow instruction sheet as given.     Follow-Up: At [facility], you and your health needs are our priority.  As part of our continuing mission to provide you with exceptional heart care, we have created designated Provider Care Teams.  These Care Teams include your primary Cardiologist (physician) and Advanced Practice Providers (APPs -  Physician Assistants and Nurse Practitioners) who all work together to provide you with the care you need, when you need it. You will need a follow up appointment in 3 months.   You may see  Dr.Raynetta Osterloh or one of the following Advanced Practice Providers on your designated  Care Team:   Rosaria Ferries, PA-C . Jory Sims, DNP, ANP  Any Other Special Instructions Will Be Listed Below (If Applicable). Please arrive at the [facility] main entrance of [facility] at TBD AM (30-45 minutes prior to test start time)  [facility] [place], [place] [address] [phone]  Proceed to the [facility] Radiology Department (First Floor).  Please follow these instructions carefully (unless otherwise directed):   Please return to our office 1-2 days prior to your test for labwork. You do not need to fast.  On the Night Before the Test: . Be sure to Drink plenty of water. . Do not consume any caffeinated/decaffeinated beverages or chocolate 12 hours prior to your test. . Do not take any antihistamines 12 hours prior to your test.  On the Day of the Test: . Drink plenty of water. Do not drink any water within one hour of the test. . Do not eat any food 4 hours prior to the test. . You may take your regular medications prior to the test.  . Take metoprolol (Lopressor) two hours prior to test. An Rx has been sent to your pharmacy         After the Test: . Drink plenty of water. . After receiving IV contrast, you may experience a mild flushed feeling. This is normal. . On occasion, you may experience a mild rash up to 24 hours after  the test. This is not dangerous. If this occurs, you can take Benadryl 25 mg and increase your fluid intake. . If you experience trouble breathing, this can be serious. If it is severe call 911 IMMEDIATELY. If it is mild, please call our office. . If you take any of these medications: Glipizide/Metformin, Avandament, Glucavance, please do not take 48 hours after completing test.       Signed, Elouise Munroe, MD  11/09/2018 9:38 AM    [place]

## 2018-11-08 NOTE — Patient Instructions (Signed)
Medication Instructions:  Your physician recommends that you continue on your current medications as directed. Please refer to the Current Medication list given to you today.  If you need a refill on your cardiac medications before your next appointment, please call your pharmacy.   Lab work: Your physician recommends that you return for lab work 1-2 days prior to your Cor CTA  If you have labs (blood work) drawn today and your tests are completely normal, you will receive your results only by: Marland Kitchen MyChart Message (if you have MyChart) OR . A paper copy in the mail If you have any lab test that is abnormal or we need to change your treatment, we will call you to review the results.  Testing/Procedures: Your physician has requested that you have an echocardiogram. Echocardiography is a painless test that uses sound waves to create images of your heart. It provides your doctor with information about the size and shape of your heart and how well your heart's chambers and valves are working. This procedure takes approximately one hour. There are no restrictions for this procedure.  Your physician has requested that you have cardiac CT. Cardiac computed tomography (CT) is a painless test that uses an x-ray machine to take clear, detailed pictures of your heart. For further information please visit [facility]uge[facility]iesta[facility]tn. Please follow instruction sheet as given.     Follow-Up: At [facility], you and your health needs are our priority.  As part of our continuing mission to provide you with exceptional heart care, we have created designated Provider Care Teams.  These Care Teams include your primary Cardiologist (physician) and Advanced Practice Providers (APPs -  Physician Assistants and Nurse Practitioners) who all work together to provide you with the care you need, when you need it. You will need a follow up appointment in 3 months.   You may see  Dr.Acharya or one of the following Advanced  Practice Providers on your designated Care Team:   Rosaria Ferries, PA-C . Jory Sims, DNP, ANP  Any Other Special Instructions Will Be Listed Below (If Applicable). Please arrive at the [facility] main entrance of [facility] at TBD AM (30-45 minutes prior to test start time)  [facility] [place], [place] [address] [phone]  Proceed to the [facility] Radiology Department (First Floor).  Please follow these instructions carefully (unless otherwise directed):   Please return to our office 1-2 days prior to your test for labwork. You do not need to fast.  On the Night Before the Test: . Be sure to Drink plenty of water. . Do not consume any caffeinated/decaffeinated beverages or chocolate 12 hours prior to your test. . Do not take any antihistamines 12 hours prior to your test.  On the Day of the Test: . Drink plenty of water. Do not drink any water within one hour of the test. . Do not eat any food 4 hours prior to the test. . You may take your regular medications prior to the test.  . Take metoprolol (Lopressor) two hours prior to test. An Rx has been sent to your pharmacy         After the Test: . Drink plenty of water. . After receiving IV contrast, you may experience a mild flushed feeling. This is normal. . On occasion, you may experience a mild rash up to 24 hours after the test. This is not dangerous. If this occurs, you can take Benadryl 25 mg and increase  your fluid intake. . If you experience trouble breathing, this can be serious. If it is severe call 911 IMMEDIATELY. If it is mild, please call our office. . If you take any of these medications: Glipizide/Metformin, Avandament, Glucavance, please do not take 48 hours after completing test.

## 2018-11-18 ENCOUNTER — Ambulatory Visit (HOSPITAL_COMMUNITY): Payer: PPO | Attending: Internal Medicine

## 2018-11-18 ENCOUNTER — Other Ambulatory Visit: Payer: Self-pay

## 2018-11-18 DIAGNOSIS — R0789 Other chest pain: Secondary | ICD-10-CM | POA: Diagnosis not present

## 2018-11-18 DIAGNOSIS — R0609 Other forms of dyspnea: Secondary | ICD-10-CM | POA: Diagnosis not present

## 2018-11-22 ENCOUNTER — Telehealth: Payer: Self-pay | Admitting: Internal Medicine

## 2018-11-22 ENCOUNTER — Telehealth: Payer: Self-pay

## 2018-11-22 NOTE — Telephone Encounter (Signed)
Pt aware of echo results with verbal understanding. 

## 2018-11-22 NOTE — Telephone Encounter (Signed)
New Message ° ° ° ° ° ° ° ° ° °Patient returned your call °

## 2018-11-22 NOTE — Telephone Encounter (Signed)
Called to give pt echo results.lmtcb 

## 2018-11-22 NOTE — Telephone Encounter (Signed)
-----   Message from Elouise Munroe, MD sent at 11/20/2018  9:20 AM EST ----- No worrisome findings on echocardiogram.

## 2018-11-28 NOTE — Telephone Encounter (Signed)
Addressed in tel enc same date 11/22/18

## 2018-12-05 DIAGNOSIS — R0609 Other forms of dyspnea: Secondary | ICD-10-CM | POA: Diagnosis not present

## 2018-12-05 LAB — BASIC METABOLIC PANEL
BUN/Creatinine Ratio: 14 (ref 12–28)
BUN: 11 mg/dL (ref 8–27)
CO2: 22 mmol/L (ref 20–29)
Calcium: 9 mg/dL (ref 8.7–10.3)
Chloride: 103 mmol/L (ref 96–106)
Creatinine, Ser: 0.77 mg/dL (ref 0.57–1.00)
GFR calc Af Amer: 92 mL/min/{1.73_m2} (ref >59)
GFR, EST NON AFRICAN AMERICAN: 80 mL/min/{1.73_m2} (ref >59)
Glucose: 85 mg/dL (ref 65–99)
Potassium: 4 mmol/L (ref 3.5–5.2)
Sodium: 143 mmol/L (ref 134–144)

## 2018-12-07 ENCOUNTER — Telehealth (HOSPITAL_COMMUNITY): Payer: Self-pay | Admitting: Emergency Medicine

## 2018-12-07 NOTE — Telephone Encounter (Signed)
Reaching out to patient to offer assistance regarding upcoming cardiac imaging study; pt verbalizes understanding of appt date/time, parking situation and where to check in, pre-test NPO status and medications ordered, and verified current allergies; name and call back number provided for further questions should they arise Casten Floren RN Navigator Cardiac Imaging [phone] 

## 2018-12-08 DIAGNOSIS — K219 Gastro-esophageal reflux disease without esophagitis: Secondary | ICD-10-CM | POA: Diagnosis not present

## 2018-12-08 DIAGNOSIS — R0789 Other chest pain: Secondary | ICD-10-CM | POA: Diagnosis not present

## 2018-12-08 DIAGNOSIS — Z1389 Encounter for screening for other disorder: Secondary | ICD-10-CM | POA: Diagnosis not present

## 2018-12-08 DIAGNOSIS — Z0001 Encounter for general adult medical examination with abnormal findings: Secondary | ICD-10-CM | POA: Diagnosis not present

## 2018-12-08 DIAGNOSIS — Z0000 Encounter for general adult medical examination without abnormal findings: Secondary | ICD-10-CM | POA: Diagnosis not present

## 2018-12-08 DIAGNOSIS — Z23 Encounter for immunization: Secondary | ICD-10-CM | POA: Diagnosis not present

## 2018-12-08 DIAGNOSIS — E559 Vitamin D deficiency, unspecified: Secondary | ICD-10-CM | POA: Diagnosis not present

## 2018-12-08 DIAGNOSIS — F3289 Other specified depressive episodes: Secondary | ICD-10-CM | POA: Diagnosis not present

## 2018-12-08 DIAGNOSIS — E7800 Pure hypercholesterolemia, unspecified: Secondary | ICD-10-CM | POA: Diagnosis not present

## 2018-12-08 DIAGNOSIS — I10 Essential (primary) hypertension: Secondary | ICD-10-CM | POA: Diagnosis not present

## 2018-12-09 ENCOUNTER — Ambulatory Visit (HOSPITAL_COMMUNITY)
Admission: RE | Admit: 2018-12-09 | Discharge: 2018-12-09 | Disposition: A | Discharge status: Home / Self Care | Payer: PPO | Source: Ambulatory Visit | Attending: Internal Medicine | Admitting: Internal Medicine

## 2018-12-09 ENCOUNTER — Ambulatory Visit (HOSPITAL_COMMUNITY): Admission: RE | Admit: 2018-12-09 | Payer: PPO | Source: Ambulatory Visit

## 2018-12-09 DIAGNOSIS — R0789 Other chest pain: Secondary | ICD-10-CM | POA: Diagnosis not present

## 2018-12-09 MED ORDER — NITROGLYCERIN 0.4 MG SL SUBL
SUBLINGUAL_TABLET | SUBLINGUAL | Status: AC
  Administered 2018-12-09: 0.8 mg via SUBLINGUAL
  Filled 2018-12-09: qty 2

## 2018-12-09 MED ORDER — NITROGLYCERIN 0.4 MG SL SUBL
0.8000 mg | SUBLINGUAL_TABLET | Freq: Once | SUBLINGUAL | Status: AC
  Administered 2018-12-09: 0.8 mg via SUBLINGUAL

## 2018-12-09 MED ORDER — IOPAMIDOL (ISOVUE-370) INJECTION 76%
80.0000 mL | Freq: Once | INTRAVENOUS | Status: AC | PRN
  Administered 2018-12-09: 80 mL via INTRAVENOUS

## 2018-12-13 ENCOUNTER — Telehealth: Payer: Self-pay

## 2018-12-13 NOTE — Telephone Encounter (Signed)
Pt aware of Cardiac CT results with verbal understanding.

## 2019-02-14 ENCOUNTER — Ambulatory Visit: Payer: PPO | Admitting: Internal Medicine

## 2019-02-25 IMAGING — CT CT HEART MORP W/ CTA COR W/ SCORE W/ CA W/CM &/OR W/O CM
4 of 7 series · 8 of 20 positions shown, 9 images · IV contrast (APPLIED)
Comparison: None.

Addendum:
EXAM:
OVER-READ INTERPRETATION  CT CHEST

The following report is an over-read performed by radiologist Dr.
Snoepje Geboers [REDACTED] on 12/09/2018. This
over-read does not include interpretation of cardiac or coronary
anatomy or pathology. The coronary calcium score/coronary CTA
interpretation by the cardiologist is attached.
CLINICAL DATA: 66F wtih hypertension, hyperlipidemia, GERD, family
history of CAD and chest pain.
Cardiac/Coronary  CT
TECHNIQUE: The patient was scanned on a Phillips Force scanner.

[Series 6: best diast 73 % · axial · 0.38mm/px · z∈[+1086,+1132]mm · 2 of 346 slices shown, 3 images]
[im 116/346  vessel]
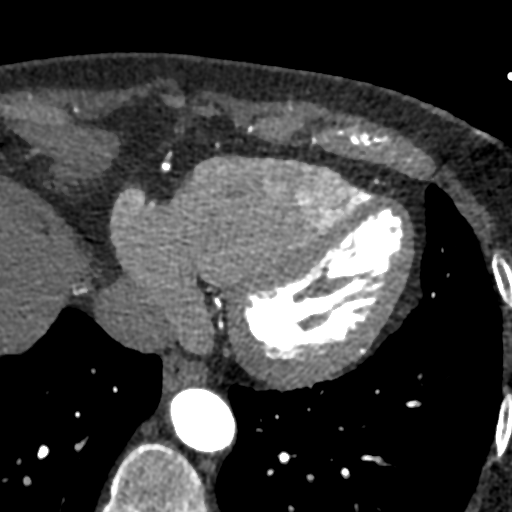
[im 116/346  lung]
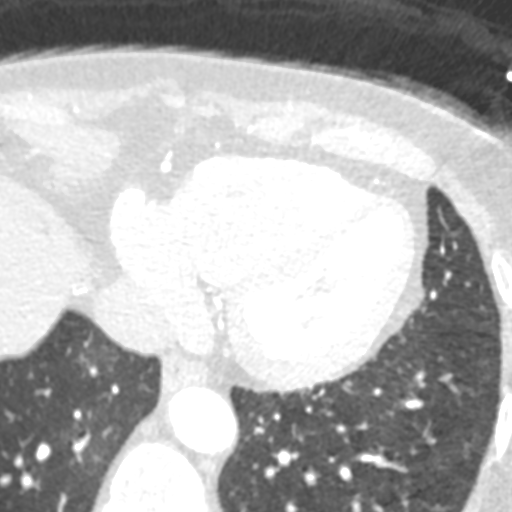
[im 231/346  vessel]
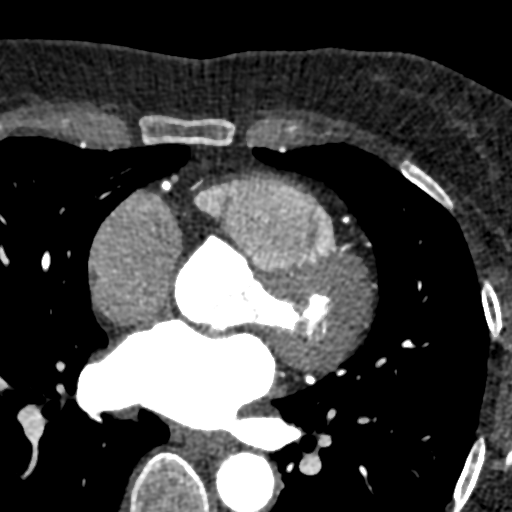

[Series 7: best syst 34 % · axial · 0.38mm/px · z∈[+1086,+1132]mm · 2 of 346 slices shown]
[im 116/346  vessel]
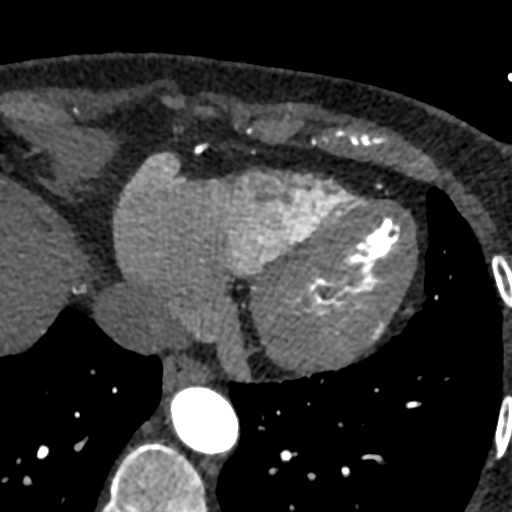
[im 231/346  vessel]
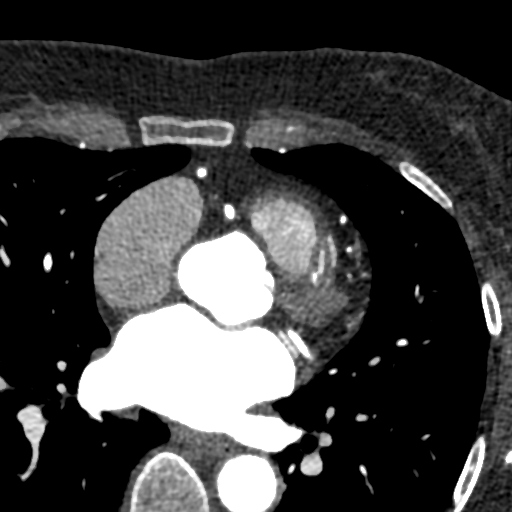

[Series 8: ts diast sharp 73 % · axial · 0.38mm/px · z∈[+1086,+1132]mm · 2 of 346 slices shown]
[im 116/346  lung]
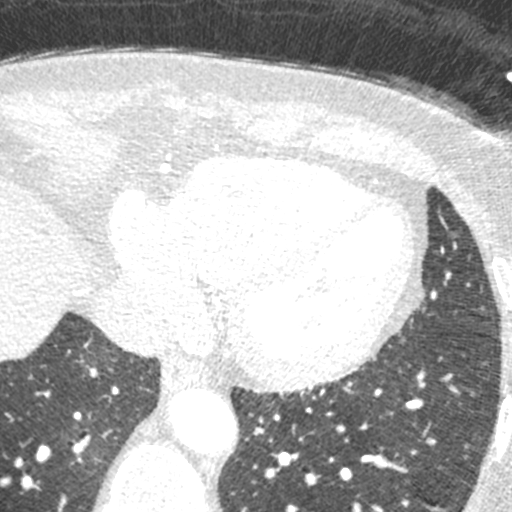
[im 231/346  lung]
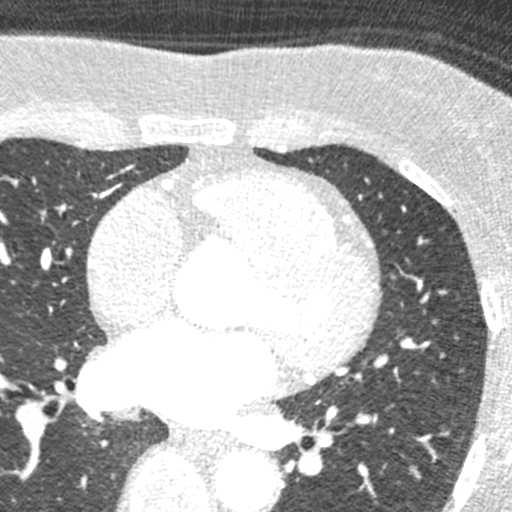

[Series 9: ts syst sharp 34 % · axial · 0.38mm/px · z∈[+1086,+1132]mm · 2 of 346 slices shown]
[im 116/346  lung]
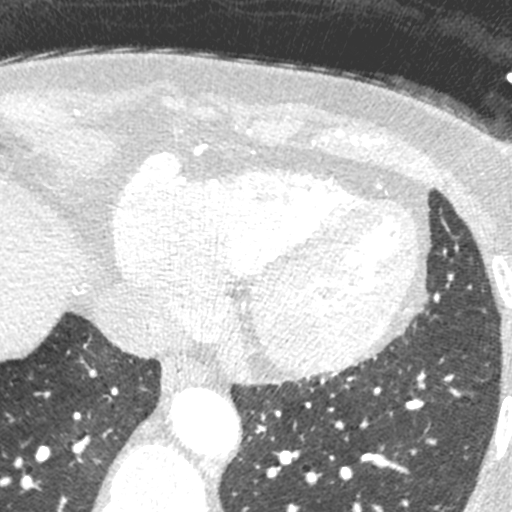
[im 231/346  lung]
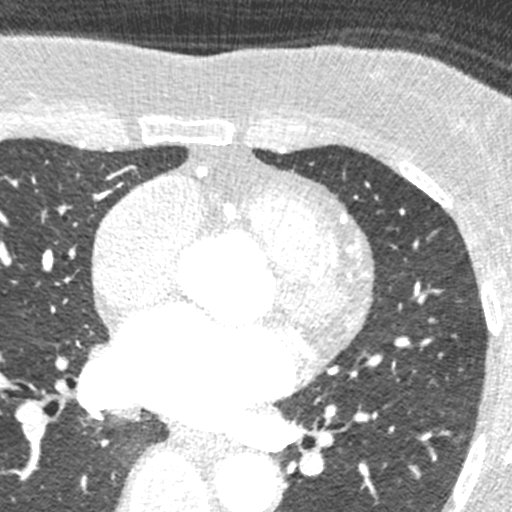

[8 of 20 positions shown; findings below may reference images not displayed]

FINDINGS: Within the visualized portions of the thorax there are no suspicious
appearing pulmonary nodules or masses, there is no acute
consolidative airspace disease, no pleural effusions, no
pneumothorax and no lymphadenopathy. Visualized portions of the
upper abdomen are unremarkable. There are no aggressive appearing
lytic or blastic lesions noted in the visualized portions of the
skeleton.
IMPRESSION: No significant incidental noncardiac findings are noted.
FINDINGS: A 120 kV prospective scan was triggered in the descending thoracic
aorta at 111 HU's. Axial non-contrast 3 mm slices were carried out
through the heart. The data set was analyzed on a dedicated work
station and scored using the Agatson method. Gantry rotation speed
was 250 msecs and collimation was .6 mm. No beta blockade and 0.8 mg
of sl NTG was given. The 3D data set was reconstructed in 5%
intervals of the 67-82 % of the R-R cycle. Diastolic phases were
analyzed on a dedicated work station using MPR, MIP and VRT modes.
The patient received 80 cc of contrast.

Aorta: Normal size. Ascending aorta 3.0 cm. No calcifications. No
dissection.

Aortic Valve:  Trileaflet.  No calcifications.

Coronary Arteries:  Normal coronary origin.  Right dominance.

RCA is a large dominant artery that gives rise to PDA and PLVB.
There is no plaque.

Left main is a large artery that gives rise to LAD and LCX arteries.

LAD is a large vessel that has no plaque.

LCX is a non-dominant artery that gives rise to one large OM1
branch. There is minimal (1-24%) calcified plaque in proximal OM1.

Other findings:

Normal pulmonary vein drainage into the left atrium.

Normal let atrial appendage without a thrombus.

Normal size of the pulmonary artery.
IMPRESSION: 1. Coronary calcium score of 0. This was 0 percentile for age and
sex matched control.

2. Normal coronary origin with right dominance.

3. There is very minimal calcified plaque in proximal OM1, though
this region was not detected on coronary calcium scoring.

4. No significant coronary artery disease noted.

*** End of Addendum ***

## 2019-03-23 DIAGNOSIS — M13842 Other specified arthritis, left hand: Secondary | ICD-10-CM | POA: Diagnosis not present

## 2019-03-23 DIAGNOSIS — G5602 Carpal tunnel syndrome, left upper limb: Secondary | ICD-10-CM | POA: Diagnosis not present

## 2019-04-20 DIAGNOSIS — M13842 Other specified arthritis, left hand: Secondary | ICD-10-CM | POA: Diagnosis not present

## 2019-04-20 DIAGNOSIS — G5602 Carpal tunnel syndrome, left upper limb: Secondary | ICD-10-CM | POA: Diagnosis not present

## 2019-05-11 DIAGNOSIS — Z471 Aftercare following joint replacement surgery: Secondary | ICD-10-CM | POA: Diagnosis not present

## 2019-05-11 DIAGNOSIS — Z96652 Presence of left artificial knee joint: Secondary | ICD-10-CM | POA: Diagnosis not present

## 2019-05-11 DIAGNOSIS — Z96642 Presence of left artificial hip joint: Secondary | ICD-10-CM | POA: Diagnosis not present

## 2019-05-11 DIAGNOSIS — M25551 Pain in right hip: Secondary | ICD-10-CM | POA: Diagnosis not present

## 2019-05-11 DIAGNOSIS — Z96651 Presence of right artificial knee joint: Secondary | ICD-10-CM | POA: Diagnosis not present

## 2019-05-31 DIAGNOSIS — Z1231 Encounter for screening mammogram for malignant neoplasm of breast: Secondary | ICD-10-CM | POA: Diagnosis not present

## 2019-05-31 DIAGNOSIS — N958 Other specified menopausal and perimenopausal disorders: Secondary | ICD-10-CM | POA: Diagnosis not present

## 2019-05-31 DIAGNOSIS — Z124 Encounter for screening for malignant neoplasm of cervix: Secondary | ICD-10-CM | POA: Diagnosis not present

## 2019-07-25 DIAGNOSIS — G5602 Carpal tunnel syndrome, left upper limb: Secondary | ICD-10-CM | POA: Diagnosis not present

## 2019-07-25 DIAGNOSIS — M13842 Other specified arthritis, left hand: Secondary | ICD-10-CM | POA: Diagnosis not present

## 2019-08-22 DIAGNOSIS — M13842 Other specified arthritis, left hand: Secondary | ICD-10-CM | POA: Diagnosis not present

## 2019-08-22 DIAGNOSIS — M65839 Other synovitis and tenosynovitis, unspecified forearm: Secondary | ICD-10-CM | POA: Diagnosis not present

## 2019-08-22 DIAGNOSIS — M25532 Pain in left wrist: Secondary | ICD-10-CM | POA: Diagnosis not present

## 2019-08-23 DIAGNOSIS — M1712 Unilateral primary osteoarthritis, left knee: Secondary | ICD-10-CM | POA: Diagnosis not present

## 2019-08-30 DIAGNOSIS — G5602 Carpal tunnel syndrome, left upper limb: Secondary | ICD-10-CM | POA: Diagnosis not present

## 2019-09-07 DIAGNOSIS — M13842 Other specified arthritis, left hand: Secondary | ICD-10-CM | POA: Diagnosis not present

## 2019-09-20 DIAGNOSIS — M8949 Other hypertrophic osteoarthropathy, multiple sites: Secondary | ICD-10-CM | POA: Diagnosis not present

## 2019-09-20 DIAGNOSIS — D3001 Benign neoplasm of right kidney: Secondary | ICD-10-CM | POA: Diagnosis not present

## 2019-09-20 DIAGNOSIS — K219 Gastro-esophageal reflux disease without esophagitis: Secondary | ICD-10-CM | POA: Diagnosis not present

## 2019-09-20 DIAGNOSIS — F3289 Other specified depressive episodes: Secondary | ICD-10-CM | POA: Diagnosis not present

## 2019-09-20 DIAGNOSIS — I10 Essential (primary) hypertension: Secondary | ICD-10-CM | POA: Diagnosis not present

## 2019-09-21 DIAGNOSIS — M65832 Other synovitis and tenosynovitis, left forearm: Secondary | ICD-10-CM | POA: Diagnosis not present

## 2019-10-16 DIAGNOSIS — M799 Soft tissue disorder, unspecified: Secondary | ICD-10-CM | POA: Diagnosis not present

## 2019-10-16 DIAGNOSIS — M659 Synovitis and tenosynovitis, unspecified: Secondary | ICD-10-CM | POA: Diagnosis not present

## 2019-10-16 DIAGNOSIS — M24132 Other articular cartilage disorders, left wrist: Secondary | ICD-10-CM | POA: Diagnosis not present

## 2019-10-16 DIAGNOSIS — M25332 Other instability, left wrist: Secondary | ICD-10-CM | POA: Diagnosis not present

## 2019-10-16 DIAGNOSIS — M65832 Other synovitis and tenosynovitis, left forearm: Secondary | ICD-10-CM | POA: Diagnosis not present

## 2019-10-31 DIAGNOSIS — M65832 Other synovitis and tenosynovitis, left forearm: Secondary | ICD-10-CM | POA: Diagnosis not present

## 2019-10-31 DIAGNOSIS — Z4789 Encounter for other orthopedic aftercare: Secondary | ICD-10-CM | POA: Diagnosis not present

## 2019-12-12 DIAGNOSIS — I10 Essential (primary) hypertension: Secondary | ICD-10-CM | POA: Diagnosis not present

## 2019-12-12 DIAGNOSIS — E7800 Pure hypercholesterolemia, unspecified: Secondary | ICD-10-CM | POA: Diagnosis not present

## 2019-12-12 DIAGNOSIS — Z0000 Encounter for general adult medical examination without abnormal findings: Secondary | ICD-10-CM | POA: Diagnosis not present

## 2019-12-12 DIAGNOSIS — G5602 Carpal tunnel syndrome, left upper limb: Secondary | ICD-10-CM | POA: Diagnosis not present

## 2019-12-12 DIAGNOSIS — F3342 Major depressive disorder, recurrent, in full remission: Secondary | ICD-10-CM | POA: Diagnosis not present

## 2019-12-12 DIAGNOSIS — E559 Vitamin D deficiency, unspecified: Secondary | ICD-10-CM | POA: Diagnosis not present

## 2019-12-29 DIAGNOSIS — E559 Vitamin D deficiency, unspecified: Secondary | ICD-10-CM | POA: Diagnosis not present

## 2019-12-29 DIAGNOSIS — I10 Essential (primary) hypertension: Secondary | ICD-10-CM | POA: Diagnosis not present

## 2019-12-29 DIAGNOSIS — E7800 Pure hypercholesterolemia, unspecified: Secondary | ICD-10-CM | POA: Diagnosis not present

## 2020-01-08 DIAGNOSIS — E669 Obesity, unspecified: Secondary | ICD-10-CM | POA: Diagnosis not present

## 2020-01-08 DIAGNOSIS — M2550 Pain in unspecified joint: Secondary | ICD-10-CM | POA: Diagnosis not present

## 2020-01-08 DIAGNOSIS — M25532 Pain in left wrist: Secondary | ICD-10-CM | POA: Diagnosis not present

## 2020-01-08 DIAGNOSIS — Z6830 Body mass index (BMI) 30.0-30.9, adult: Secondary | ICD-10-CM | POA: Diagnosis not present

## 2020-01-13 ENCOUNTER — Ambulatory Visit: Payer: PPO | Attending: Internal Medicine

## 2020-01-13 DIAGNOSIS — Z23 Encounter for immunization: Secondary | ICD-10-CM | POA: Insufficient documentation

## 2020-01-13 NOTE — Progress Notes (Signed)
   Covid-19 Vaccination Clinic  Name:  Christine [name]    MRN: [id number] DOB: 26-Sep-1951  01/13/2020  Christine [name] was observed post Covid-19 immunization for 15 minutes without incidence. She was provided with Vaccine Information Sheet and instruction to access the V-Safe system.   Christine [name] was instructed to call 911 with any severe reactions post vaccine: Marland Kitchen Difficulty breathing  . Swelling of your face and throat  . A fast heartbeat  . A bad rash all over your body  . Dizziness and weakness    Immunizations Administered    Name Date Dose VIS Date Route   Pfizer COVID-19 Vaccine 01/13/2020  8:54 AM 0.3 mL 11/10/2019 Intramuscular   Manufacturer: [place]   Lot: [id number]   [place]: [id number]

## 2020-01-16 DIAGNOSIS — M25532 Pain in left wrist: Secondary | ICD-10-CM | POA: Diagnosis not present

## 2020-01-16 DIAGNOSIS — E669 Obesity, unspecified: Secondary | ICD-10-CM | POA: Diagnosis not present

## 2020-01-16 DIAGNOSIS — M2550 Pain in unspecified joint: Secondary | ICD-10-CM | POA: Diagnosis not present

## 2020-01-16 DIAGNOSIS — R768 Other specified abnormal immunological findings in serum: Secondary | ICD-10-CM | POA: Diagnosis not present

## 2020-01-16 DIAGNOSIS — Z6831 Body mass index (BMI) 31.0-31.9, adult: Secondary | ICD-10-CM | POA: Diagnosis not present

## 2020-01-16 DIAGNOSIS — M150 Primary generalized (osteo)arthritis: Secondary | ICD-10-CM | POA: Diagnosis not present

## 2020-02-04 ENCOUNTER — Ambulatory Visit: Payer: PPO | Attending: Internal Medicine

## 2020-02-04 DIAGNOSIS — Z23 Encounter for immunization: Secondary | ICD-10-CM | POA: Insufficient documentation

## 2020-02-04 NOTE — Progress Notes (Signed)
   Covid-19 Vaccination Clinic  Name:  Christine [name]    MRN: [id number] DOB: Apr 06, 1951  02/04/2020  Christine [name] was observed post Covid-19 immunization for 15 minutes without incident. She was provided with Vaccine Information Sheet and instruction to access the V-Safe system.   Christine [name] was instructed to call 911 with any severe reactions post vaccine: Marland Kitchen Difficulty breathing  . Swelling of face and throat  . A fast heartbeat  . A bad rash all over body  . Dizziness and weakness   Immunizations Administered    Name Date Dose VIS Date Route   Pfizer COVID-19 Vaccine 02/04/2020 10:51 AM 0.3 mL 11/10/2019 Intramuscular   Manufacturer: [place]   Lot: [id number]   [place]: [id number]

## 2020-06-12 DIAGNOSIS — F3342 Major depressive disorder, recurrent, in full remission: Secondary | ICD-10-CM | POA: Diagnosis not present

## 2020-06-12 DIAGNOSIS — M8949 Other hypertrophic osteoarthropathy, multiple sites: Secondary | ICD-10-CM | POA: Diagnosis not present

## 2020-06-12 DIAGNOSIS — E7800 Pure hypercholesterolemia, unspecified: Secondary | ICD-10-CM | POA: Diagnosis not present

## 2020-06-12 DIAGNOSIS — I10 Essential (primary) hypertension: Secondary | ICD-10-CM | POA: Diagnosis not present

## 2020-07-16 DIAGNOSIS — Z1231 Encounter for screening mammogram for malignant neoplasm of breast: Secondary | ICD-10-CM | POA: Diagnosis not present

## 2020-07-16 DIAGNOSIS — Z01419 Encounter for gynecological examination (general) (routine) without abnormal findings: Secondary | ICD-10-CM | POA: Diagnosis not present

## 2020-07-16 DIAGNOSIS — Z6832 Body mass index (BMI) 32.0-32.9, adult: Secondary | ICD-10-CM | POA: Diagnosis not present

## 2020-07-26 ENCOUNTER — Other Ambulatory Visit: Payer: Self-pay

## 2020-07-26 ENCOUNTER — Emergency Department (HOSPITAL_COMMUNITY): Payer: PPO

## 2020-07-26 ENCOUNTER — Encounter (HOSPITAL_COMMUNITY): Payer: Self-pay

## 2020-07-26 ENCOUNTER — Emergency Department (HOSPITAL_COMMUNITY)
Admission: EM | Admit: 2020-07-26 | Discharge: 2020-07-26 | Disposition: A | Discharge status: Home / Self Care | Payer: PPO | Attending: Emergency Medicine | Admitting: Emergency Medicine

## 2020-07-26 DIAGNOSIS — R0902 Hypoxemia: Secondary | ICD-10-CM | POA: Diagnosis not present

## 2020-07-26 DIAGNOSIS — R0789 Other chest pain: Secondary | ICD-10-CM | POA: Insufficient documentation

## 2020-07-26 DIAGNOSIS — R110 Nausea: Secondary | ICD-10-CM | POA: Diagnosis not present

## 2020-07-26 DIAGNOSIS — R0602 Shortness of breath: Secondary | ICD-10-CM | POA: Diagnosis not present

## 2020-07-26 DIAGNOSIS — Z5321 Procedure and treatment not carried out due to patient leaving prior to being seen by health care provider: Secondary | ICD-10-CM | POA: Insufficient documentation

## 2020-07-26 DIAGNOSIS — J90 Pleural effusion, not elsewhere classified: Secondary | ICD-10-CM | POA: Diagnosis not present

## 2020-07-26 DIAGNOSIS — R079 Chest pain, unspecified: Secondary | ICD-10-CM | POA: Diagnosis not present

## 2020-07-26 LAB — CBC
HCT: 40.5 % (ref 36.0–46.0)
Hemoglobin: 12.8 g/dL (ref 12.0–15.0)
MCH: 29.8 pg (ref 26.0–34.0)
MCHC: 31.6 g/dL (ref 30.0–36.0)
MCV: 94.4 fL (ref 80.0–100.0)
Platelets: 260 10*3/uL (ref 150–400)
RBC: 4.29 MIL/uL (ref 3.87–5.11)
RDW: 12.5 % (ref 11.5–15.5)
WBC: 6.2 10*3/uL (ref 4.0–10.5)
nRBC: 0 % (ref 0.0–0.2)

## 2020-07-26 LAB — BASIC METABOLIC PANEL
Anion gap: 10 (ref 5–15)
BUN: 10 mg/dL (ref 8–23)
CO2: 27 mmol/L (ref 22–32)
Calcium: 9.3 mg/dL (ref 8.9–10.3)
Chloride: 106 mmol/L (ref 98–111)
Creatinine, Ser: 0.89 mg/dL (ref 0.44–1.00)
GFR calc Af Amer: >60 mL/min (ref >60)
GFR calc non Af Amer: >60 mL/min (ref >60)
Glucose, Bld: 110 mg/dL — ABNORMAL HIGH (ref 70–99)
Potassium: 4.6 mmol/L (ref 3.5–5.1)
Sodium: 143 mmol/L (ref 135–145)

## 2020-07-26 LAB — TROPONIN I (HIGH SENSITIVITY): Troponin I (High Sensitivity): 21 ng/L — ABNORMAL HIGH (ref <18)

## 2020-07-26 MED ORDER — ACETAMINOPHEN 325 MG PO TABS
650.0000 mg | ORAL_TABLET | Freq: Once | ORAL | Status: AC
  Administered 2020-07-26: 650 mg via ORAL
  Filled 2020-07-26: qty 2

## 2020-07-26 NOTE — ED Triage Notes (Signed)
Pt BIB [facility] pt woke up this am with Left side stabbing CP associated w/SOB and nausea after drinking a cup of coffee. Pain 7/10 given nitro x2 pain now 2/10 Pt reports she can not take asa    18 g LAC   BP 131/85 HR 72 95% RA  97.7  CBG 132

## 2020-07-26 NOTE — ED Notes (Signed)
PT notified sort she had a ride on the way and was ready to leave

## 2020-07-29 DIAGNOSIS — R05 Cough: Secondary | ICD-10-CM | POA: Diagnosis not present

## 2020-07-29 DIAGNOSIS — I10 Essential (primary) hypertension: Secondary | ICD-10-CM | POA: Diagnosis not present

## 2020-09-11 DIAGNOSIS — H524 Presbyopia: Secondary | ICD-10-CM | POA: Diagnosis not present

## 2020-09-11 DIAGNOSIS — H2513 Age-related nuclear cataract, bilateral: Secondary | ICD-10-CM | POA: Diagnosis not present

## 2020-09-11 DIAGNOSIS — H35361 Drusen (degenerative) of macula, right eye: Secondary | ICD-10-CM | POA: Diagnosis not present

## 2020-09-16 DIAGNOSIS — H2513 Age-related nuclear cataract, bilateral: Secondary | ICD-10-CM | POA: Diagnosis not present

## 2020-10-22 DIAGNOSIS — H25011 Cortical age-related cataract, right eye: Secondary | ICD-10-CM | POA: Diagnosis not present

## 2020-10-22 DIAGNOSIS — H25811 Combined forms of age-related cataract, right eye: Secondary | ICD-10-CM | POA: Diagnosis not present

## 2020-10-22 DIAGNOSIS — H2511 Age-related nuclear cataract, right eye: Secondary | ICD-10-CM | POA: Diagnosis not present

## 2021-01-29 DIAGNOSIS — H04121 Dry eye syndrome of right lacrimal gland: Secondary | ICD-10-CM | POA: Diagnosis not present

## 2021-02-06 DIAGNOSIS — Z96651 Presence of right artificial knee joint: Secondary | ICD-10-CM | POA: Diagnosis not present

## 2021-02-06 DIAGNOSIS — Z96653 Presence of artificial knee joint, bilateral: Secondary | ICD-10-CM | POA: Diagnosis not present

## 2021-02-06 DIAGNOSIS — Z96652 Presence of left artificial knee joint: Secondary | ICD-10-CM | POA: Diagnosis not present

## 2021-02-06 DIAGNOSIS — Z96642 Presence of left artificial hip joint: Secondary | ICD-10-CM | POA: Diagnosis not present

## 2021-03-03 DIAGNOSIS — H04121 Dry eye syndrome of right lacrimal gland: Secondary | ICD-10-CM | POA: Diagnosis not present

## 2021-04-07 DIAGNOSIS — E559 Vitamin D deficiency, unspecified: Secondary | ICD-10-CM | POA: Diagnosis not present

## 2021-04-07 DIAGNOSIS — E7800 Pure hypercholesterolemia, unspecified: Secondary | ICD-10-CM | POA: Diagnosis not present

## 2021-04-07 DIAGNOSIS — I10 Essential (primary) hypertension: Secondary | ICD-10-CM | POA: Diagnosis not present

## 2021-04-09 DIAGNOSIS — Z1211 Encounter for screening for malignant neoplasm of colon: Secondary | ICD-10-CM | POA: Diagnosis not present

## 2021-04-09 DIAGNOSIS — E559 Vitamin D deficiency, unspecified: Secondary | ICD-10-CM | POA: Diagnosis not present

## 2021-04-09 DIAGNOSIS — I10 Essential (primary) hypertension: Secondary | ICD-10-CM | POA: Diagnosis not present

## 2021-04-09 DIAGNOSIS — F3342 Major depressive disorder, recurrent, in full remission: Secondary | ICD-10-CM | POA: Diagnosis not present

## 2021-04-09 DIAGNOSIS — E7800 Pure hypercholesterolemia, unspecified: Secondary | ICD-10-CM | POA: Diagnosis not present

## 2021-04-09 DIAGNOSIS — G5602 Carpal tunnel syndrome, left upper limb: Secondary | ICD-10-CM | POA: Diagnosis not present

## 2021-04-09 DIAGNOSIS — Z0000 Encounter for general adult medical examination without abnormal findings: Secondary | ICD-10-CM | POA: Diagnosis not present

## 2021-04-24 DIAGNOSIS — Z1211 Encounter for screening for malignant neoplasm of colon: Secondary | ICD-10-CM | POA: Diagnosis not present

## 2021-05-26 DIAGNOSIS — L98499 Non-pressure chronic ulcer of skin of other sites with unspecified severity: Secondary | ICD-10-CM | POA: Diagnosis not present

## 2021-05-26 DIAGNOSIS — D1801 Hemangioma of skin and subcutaneous tissue: Secondary | ICD-10-CM | POA: Diagnosis not present

## 2021-05-26 DIAGNOSIS — L570 Actinic keratosis: Secondary | ICD-10-CM | POA: Diagnosis not present

## 2021-05-26 DIAGNOSIS — D225 Melanocytic nevi of trunk: Secondary | ICD-10-CM | POA: Diagnosis not present

## 2021-05-26 DIAGNOSIS — L738 Other specified follicular disorders: Secondary | ICD-10-CM | POA: Diagnosis not present

## 2021-05-26 DIAGNOSIS — L821 Other seborrheic keratosis: Secondary | ICD-10-CM | POA: Diagnosis not present

## 2021-07-25 DIAGNOSIS — R197 Diarrhea, unspecified: Secondary | ICD-10-CM | POA: Diagnosis not present

## 2021-07-25 DIAGNOSIS — K5900 Constipation, unspecified: Secondary | ICD-10-CM | POA: Diagnosis not present

## 2021-07-25 DIAGNOSIS — Z1211 Encounter for screening for malignant neoplasm of colon: Secondary | ICD-10-CM | POA: Diagnosis not present

## 2021-08-14 DIAGNOSIS — Z1231 Encounter for screening mammogram for malignant neoplasm of breast: Secondary | ICD-10-CM | POA: Diagnosis not present

## 2021-08-14 DIAGNOSIS — F32A Depression, unspecified: Secondary | ICD-10-CM | POA: Diagnosis not present

## 2021-08-14 DIAGNOSIS — N958 Other specified menopausal and perimenopausal disorders: Secondary | ICD-10-CM | POA: Diagnosis not present

## 2021-08-14 DIAGNOSIS — Z6828 Body mass index (BMI) 28.0-28.9, adult: Secondary | ICD-10-CM | POA: Diagnosis not present

## 2021-08-14 DIAGNOSIS — Z79890 Hormone replacement therapy (postmenopausal): Secondary | ICD-10-CM | POA: Diagnosis not present

## 2021-08-14 DIAGNOSIS — Z124 Encounter for screening for malignant neoplasm of cervix: Secondary | ICD-10-CM | POA: Diagnosis not present

## 2021-10-07 DIAGNOSIS — E7800 Pure hypercholesterolemia, unspecified: Secondary | ICD-10-CM | POA: Diagnosis not present

## 2021-10-07 DIAGNOSIS — I10 Essential (primary) hypertension: Secondary | ICD-10-CM | POA: Diagnosis not present

## 2021-10-10 ENCOUNTER — Other Ambulatory Visit (HOSPITAL_COMMUNITY): Payer: Self-pay | Admitting: Family Medicine

## 2021-10-15 DIAGNOSIS — H2512 Age-related nuclear cataract, left eye: Secondary | ICD-10-CM | POA: Diagnosis not present

## 2021-10-16 ENCOUNTER — Ambulatory Visit (HOSPITAL_COMMUNITY)
Admission: RE | Admit: 2021-10-16 | Discharge: 2021-10-16 | Disposition: A | Discharge status: Home / Self Care | Payer: Self-pay | Source: Ambulatory Visit | Attending: Family Medicine | Admitting: Family Medicine

## 2021-10-16 ENCOUNTER — Other Ambulatory Visit: Payer: Self-pay

## 2021-10-16 DIAGNOSIS — R918 Other nonspecific abnormal finding of lung field: Secondary | ICD-10-CM | POA: Insufficient documentation

## 2021-10-16 DIAGNOSIS — E7800 Pure hypercholesterolemia, unspecified: Secondary | ICD-10-CM | POA: Insufficient documentation

## 2021-12-02 DIAGNOSIS — H25812 Combined forms of age-related cataract, left eye: Secondary | ICD-10-CM | POA: Diagnosis not present

## 2021-12-02 DIAGNOSIS — H2512 Age-related nuclear cataract, left eye: Secondary | ICD-10-CM | POA: Diagnosis not present

## 2022-01-26 DIAGNOSIS — R197 Diarrhea, unspecified: Secondary | ICD-10-CM | POA: Diagnosis not present

## 2022-01-26 DIAGNOSIS — D123 Benign neoplasm of transverse colon: Secondary | ICD-10-CM | POA: Diagnosis not present

## 2022-01-26 DIAGNOSIS — Z1211 Encounter for screening for malignant neoplasm of colon: Secondary | ICD-10-CM | POA: Diagnosis not present

## 2022-01-28 DIAGNOSIS — D123 Benign neoplasm of transverse colon: Secondary | ICD-10-CM | POA: Diagnosis not present

## 2022-04-10 DIAGNOSIS — Z79899 Other long term (current) drug therapy: Secondary | ICD-10-CM | POA: Diagnosis not present

## 2022-04-10 DIAGNOSIS — E559 Vitamin D deficiency, unspecified: Secondary | ICD-10-CM | POA: Diagnosis not present

## 2022-04-10 DIAGNOSIS — E7800 Pure hypercholesterolemia, unspecified: Secondary | ICD-10-CM | POA: Diagnosis not present

## 2022-04-14 DIAGNOSIS — Z1211 Encounter for screening for malignant neoplasm of colon: Secondary | ICD-10-CM | POA: Diagnosis not present

## 2022-04-14 DIAGNOSIS — Z79899 Other long term (current) drug therapy: Secondary | ICD-10-CM | POA: Diagnosis not present

## 2022-04-14 DIAGNOSIS — F3342 Major depressive disorder, recurrent, in full remission: Secondary | ICD-10-CM | POA: Diagnosis not present

## 2022-04-14 DIAGNOSIS — E7800 Pure hypercholesterolemia, unspecified: Secondary | ICD-10-CM | POA: Diagnosis not present

## 2022-04-14 DIAGNOSIS — Z0000 Encounter for general adult medical examination without abnormal findings: Secondary | ICD-10-CM | POA: Diagnosis not present

## 2022-04-14 DIAGNOSIS — I10 Essential (primary) hypertension: Secondary | ICD-10-CM | POA: Diagnosis not present

## 2022-04-14 DIAGNOSIS — R2242 Localized swelling, mass and lump, left lower limb: Secondary | ICD-10-CM | POA: Diagnosis not present

## 2022-04-14 DIAGNOSIS — E559 Vitamin D deficiency, unspecified: Secondary | ICD-10-CM | POA: Diagnosis not present

## 2022-04-16 ENCOUNTER — Other Ambulatory Visit (HOSPITAL_BASED_OUTPATIENT_CLINIC_OR_DEPARTMENT_OTHER): Payer: Self-pay | Admitting: Family Medicine

## 2022-04-16 DIAGNOSIS — R2242 Localized swelling, mass and lump, left lower limb: Secondary | ICD-10-CM

## 2022-05-04 ENCOUNTER — Ambulatory Visit (HOSPITAL_BASED_OUTPATIENT_CLINIC_OR_DEPARTMENT_OTHER)
Admission: RE | Admit: 2022-05-04 | Discharge: 2022-05-04 | Disposition: A | Discharge status: Home / Self Care | Payer: PPO | Source: Ambulatory Visit | Attending: Family Medicine | Admitting: Family Medicine

## 2022-05-04 ENCOUNTER — Other Ambulatory Visit (HOSPITAL_BASED_OUTPATIENT_CLINIC_OR_DEPARTMENT_OTHER): Payer: Self-pay | Admitting: Family Medicine

## 2022-05-04 DIAGNOSIS — R2242 Localized swelling, mass and lump, left lower limb: Secondary | ICD-10-CM

## 2022-07-08 DIAGNOSIS — L821 Other seborrheic keratosis: Secondary | ICD-10-CM | POA: Diagnosis not present

## 2022-07-08 DIAGNOSIS — C44319 Basal cell carcinoma of skin of other parts of face: Secondary | ICD-10-CM | POA: Diagnosis not present

## 2022-07-08 DIAGNOSIS — L738 Other specified follicular disorders: Secondary | ICD-10-CM | POA: Diagnosis not present

## 2022-07-08 DIAGNOSIS — D225 Melanocytic nevi of trunk: Secondary | ICD-10-CM | POA: Diagnosis not present

## 2022-07-08 DIAGNOSIS — M25512 Pain in left shoulder: Secondary | ICD-10-CM | POA: Diagnosis not present

## 2022-07-08 DIAGNOSIS — D2272 Melanocytic nevi of left lower limb, including hip: Secondary | ICD-10-CM | POA: Diagnosis not present

## 2022-07-08 DIAGNOSIS — D485 Neoplasm of uncertain behavior of skin: Secondary | ICD-10-CM | POA: Diagnosis not present

## 2022-07-08 DIAGNOSIS — D1801 Hemangioma of skin and subcutaneous tissue: Secondary | ICD-10-CM | POA: Diagnosis not present

## 2022-07-08 DIAGNOSIS — L720 Epidermal cyst: Secondary | ICD-10-CM | POA: Diagnosis not present

## 2022-07-08 DIAGNOSIS — C44311 Basal cell carcinoma of skin of nose: Secondary | ICD-10-CM | POA: Diagnosis not present

## 2022-07-08 DIAGNOSIS — L570 Actinic keratosis: Secondary | ICD-10-CM | POA: Diagnosis not present

## 2022-07-08 DIAGNOSIS — D0462 Carcinoma in situ of skin of left upper limb, including shoulder: Secondary | ICD-10-CM | POA: Diagnosis not present

## 2022-08-14 DIAGNOSIS — M25512 Pain in left shoulder: Secondary | ICD-10-CM | POA: Diagnosis not present

## 2022-08-19 DIAGNOSIS — M25512 Pain in left shoulder: Secondary | ICD-10-CM | POA: Diagnosis not present

## 2022-08-20 DIAGNOSIS — C4401 Basal cell carcinoma of skin of lip: Secondary | ICD-10-CM | POA: Diagnosis not present

## 2022-08-20 DIAGNOSIS — C44311 Basal cell carcinoma of skin of nose: Secondary | ICD-10-CM | POA: Diagnosis not present

## 2022-08-20 DIAGNOSIS — Z85828 Personal history of other malignant neoplasm of skin: Secondary | ICD-10-CM | POA: Diagnosis not present

## 2022-08-24 DIAGNOSIS — M25512 Pain in left shoulder: Secondary | ICD-10-CM | POA: Diagnosis not present

## 2022-08-26 DIAGNOSIS — D0462 Carcinoma in situ of skin of left upper limb, including shoulder: Secondary | ICD-10-CM | POA: Diagnosis not present

## 2022-08-26 DIAGNOSIS — Z85828 Personal history of other malignant neoplasm of skin: Secondary | ICD-10-CM | POA: Diagnosis not present

## 2022-09-14 DIAGNOSIS — M7542 Impingement syndrome of left shoulder: Secondary | ICD-10-CM | POA: Diagnosis not present

## 2022-09-25 DIAGNOSIS — E785 Hyperlipidemia, unspecified: Secondary | ICD-10-CM | POA: Diagnosis not present

## 2022-09-25 DIAGNOSIS — M94212 Chondromalacia, left shoulder: Secondary | ICD-10-CM | POA: Diagnosis not present

## 2022-09-25 DIAGNOSIS — M65812 Other synovitis and tenosynovitis, left shoulder: Secondary | ICD-10-CM | POA: Diagnosis not present

## 2022-09-25 DIAGNOSIS — S43432A Superior glenoid labrum lesion of left shoulder, initial encounter: Secondary | ICD-10-CM | POA: Diagnosis not present

## 2022-09-25 DIAGNOSIS — M75122 Complete rotator cuff tear or rupture of left shoulder, not specified as traumatic: Secondary | ICD-10-CM | POA: Diagnosis not present

## 2022-09-25 DIAGNOSIS — M19012 Primary osteoarthritis, left shoulder: Secondary | ICD-10-CM | POA: Diagnosis not present

## 2022-09-25 DIAGNOSIS — M24612 Ankylosis, left shoulder: Secondary | ICD-10-CM | POA: Diagnosis not present

## 2022-09-25 DIAGNOSIS — I890 Lymphedema, not elsewhere classified: Secondary | ICD-10-CM | POA: Diagnosis not present

## 2022-09-25 DIAGNOSIS — M7542 Impingement syndrome of left shoulder: Secondary | ICD-10-CM | POA: Diagnosis not present

## 2022-09-25 DIAGNOSIS — M24112 Other articular cartilage disorders, left shoulder: Secondary | ICD-10-CM | POA: Diagnosis not present

## 2022-09-25 DIAGNOSIS — M069 Rheumatoid arthritis, unspecified: Secondary | ICD-10-CM | POA: Diagnosis not present

## 2022-09-25 DIAGNOSIS — Z4789 Encounter for other orthopedic aftercare: Secondary | ICD-10-CM | POA: Diagnosis not present

## 2022-09-25 DIAGNOSIS — G8918 Other acute postprocedural pain: Secondary | ICD-10-CM | POA: Diagnosis not present

## 2022-09-25 DIAGNOSIS — M25512 Pain in left shoulder: Secondary | ICD-10-CM | POA: Diagnosis not present

## 2022-10-05 DIAGNOSIS — M25612 Stiffness of left shoulder, not elsewhere classified: Secondary | ICD-10-CM | POA: Diagnosis not present

## 2022-10-05 DIAGNOSIS — M25512 Pain in left shoulder: Secondary | ICD-10-CM | POA: Diagnosis not present

## 2022-10-13 DIAGNOSIS — M25512 Pain in left shoulder: Secondary | ICD-10-CM | POA: Diagnosis not present

## 2022-10-16 DIAGNOSIS — I10 Essential (primary) hypertension: Secondary | ICD-10-CM | POA: Diagnosis not present

## 2022-10-16 DIAGNOSIS — E7800 Pure hypercholesterolemia, unspecified: Secondary | ICD-10-CM | POA: Diagnosis not present

## 2022-10-16 DIAGNOSIS — K5900 Constipation, unspecified: Secondary | ICD-10-CM | POA: Diagnosis not present

## 2022-10-16 DIAGNOSIS — F3342 Major depressive disorder, recurrent, in full remission: Secondary | ICD-10-CM | POA: Diagnosis not present

## 2022-10-20 DIAGNOSIS — M25512 Pain in left shoulder: Secondary | ICD-10-CM | POA: Diagnosis not present

## 2022-10-27 DIAGNOSIS — M25512 Pain in left shoulder: Secondary | ICD-10-CM | POA: Diagnosis not present

## 2022-11-04 DIAGNOSIS — M25612 Stiffness of left shoulder, not elsewhere classified: Secondary | ICD-10-CM | POA: Diagnosis not present

## 2022-11-04 DIAGNOSIS — M25512 Pain in left shoulder: Secondary | ICD-10-CM | POA: Diagnosis not present

## 2022-11-06 DIAGNOSIS — M25512 Pain in left shoulder: Secondary | ICD-10-CM | POA: Diagnosis not present

## 2022-11-10 DIAGNOSIS — M25512 Pain in left shoulder: Secondary | ICD-10-CM | POA: Diagnosis not present

## 2022-11-13 DIAGNOSIS — M25512 Pain in left shoulder: Secondary | ICD-10-CM | POA: Diagnosis not present

## 2022-11-13 DIAGNOSIS — M25612 Stiffness of left shoulder, not elsewhere classified: Secondary | ICD-10-CM | POA: Diagnosis not present

## 2022-11-16 DIAGNOSIS — M25512 Pain in left shoulder: Secondary | ICD-10-CM | POA: Diagnosis not present

## 2022-11-19 DIAGNOSIS — M25512 Pain in left shoulder: Secondary | ICD-10-CM | POA: Diagnosis not present

## 2022-11-27 DIAGNOSIS — M25512 Pain in left shoulder: Secondary | ICD-10-CM | POA: Diagnosis not present

## 2022-11-27 DIAGNOSIS — M25612 Stiffness of left shoulder, not elsewhere classified: Secondary | ICD-10-CM | POA: Diagnosis not present

## 2022-12-01 DIAGNOSIS — M25512 Pain in left shoulder: Secondary | ICD-10-CM | POA: Diagnosis not present

## 2022-12-03 DIAGNOSIS — M25512 Pain in left shoulder: Secondary | ICD-10-CM | POA: Diagnosis not present

## 2022-12-03 DIAGNOSIS — M25612 Stiffness of left shoulder, not elsewhere classified: Secondary | ICD-10-CM | POA: Diagnosis not present

## 2022-12-07 DIAGNOSIS — M25612 Stiffness of left shoulder, not elsewhere classified: Secondary | ICD-10-CM | POA: Diagnosis not present

## 2022-12-07 DIAGNOSIS — M25512 Pain in left shoulder: Secondary | ICD-10-CM | POA: Diagnosis not present

## 2022-12-14 DIAGNOSIS — M25512 Pain in left shoulder: Secondary | ICD-10-CM | POA: Diagnosis not present

## 2022-12-17 DIAGNOSIS — M25512 Pain in left shoulder: Secondary | ICD-10-CM | POA: Diagnosis not present

## 2022-12-22 DIAGNOSIS — M25512 Pain in left shoulder: Secondary | ICD-10-CM | POA: Diagnosis not present

## 2022-12-28 DIAGNOSIS — M25512 Pain in left shoulder: Secondary | ICD-10-CM | POA: Diagnosis not present

## 2022-12-31 DIAGNOSIS — M25612 Stiffness of left shoulder, not elsewhere classified: Secondary | ICD-10-CM | POA: Diagnosis not present

## 2022-12-31 DIAGNOSIS — M25512 Pain in left shoulder: Secondary | ICD-10-CM | POA: Diagnosis not present

## 2023-01-18 DIAGNOSIS — Z961 Presence of intraocular lens: Secondary | ICD-10-CM | POA: Diagnosis not present

## 2023-01-18 DIAGNOSIS — H26491 Other secondary cataract, right eye: Secondary | ICD-10-CM | POA: Diagnosis not present

## 2023-01-18 DIAGNOSIS — H5213 Myopia, bilateral: Secondary | ICD-10-CM | POA: Diagnosis not present

## 2023-03-03 DIAGNOSIS — Z1231 Encounter for screening mammogram for malignant neoplasm of breast: Secondary | ICD-10-CM | POA: Diagnosis not present

## 2023-03-03 DIAGNOSIS — Z79890 Hormone replacement therapy (postmenopausal): Secondary | ICD-10-CM | POA: Diagnosis not present

## 2023-03-03 DIAGNOSIS — Z6829 Body mass index (BMI) 29.0-29.9, adult: Secondary | ICD-10-CM | POA: Diagnosis not present

## 2023-03-03 DIAGNOSIS — Z01419 Encounter for gynecological examination (general) (routine) without abnormal findings: Secondary | ICD-10-CM | POA: Diagnosis not present

## 2023-03-18 DIAGNOSIS — H26491 Other secondary cataract, right eye: Secondary | ICD-10-CM | POA: Diagnosis not present

## 2023-04-19 DIAGNOSIS — H43812 Vitreous degeneration, left eye: Secondary | ICD-10-CM | POA: Diagnosis not present

## 2023-04-19 DIAGNOSIS — Z961 Presence of intraocular lens: Secondary | ICD-10-CM | POA: Diagnosis not present

## 2023-04-23 DIAGNOSIS — M79641 Pain in right hand: Secondary | ICD-10-CM | POA: Diagnosis not present

## 2023-04-29 DIAGNOSIS — Z0000 Encounter for general adult medical examination without abnormal findings: Secondary | ICD-10-CM | POA: Diagnosis not present

## 2023-04-29 DIAGNOSIS — I10 Essential (primary) hypertension: Secondary | ICD-10-CM | POA: Diagnosis not present

## 2023-04-29 DIAGNOSIS — F3342 Major depressive disorder, recurrent, in full remission: Secondary | ICD-10-CM | POA: Diagnosis not present

## 2023-04-29 DIAGNOSIS — Z79899 Other long term (current) drug therapy: Secondary | ICD-10-CM | POA: Diagnosis not present

## 2023-04-29 DIAGNOSIS — E559 Vitamin D deficiency, unspecified: Secondary | ICD-10-CM | POA: Diagnosis not present

## 2023-04-29 DIAGNOSIS — E7800 Pure hypercholesterolemia, unspecified: Secondary | ICD-10-CM | POA: Diagnosis not present

## 2023-07-13 DIAGNOSIS — Z6828 Body mass index (BMI) 28.0-28.9, adult: Secondary | ICD-10-CM | POA: Diagnosis not present

## 2023-07-13 DIAGNOSIS — M1991 Primary osteoarthritis, unspecified site: Secondary | ICD-10-CM | POA: Diagnosis not present

## 2023-07-13 DIAGNOSIS — R768 Other specified abnormal immunological findings in serum: Secondary | ICD-10-CM | POA: Diagnosis not present

## 2023-07-13 DIAGNOSIS — E663 Overweight: Secondary | ICD-10-CM | POA: Diagnosis not present

## 2023-07-13 DIAGNOSIS — M2559 Pain in other specified joint: Secondary | ICD-10-CM | POA: Diagnosis not present

## 2023-07-13 DIAGNOSIS — M7989 Other specified soft tissue disorders: Secondary | ICD-10-CM | POA: Diagnosis not present

## 2023-07-21 DIAGNOSIS — M1991 Primary osteoarthritis, unspecified site: Secondary | ICD-10-CM | POA: Diagnosis not present

## 2023-07-21 DIAGNOSIS — R768 Other specified abnormal immunological findings in serum: Secondary | ICD-10-CM | POA: Diagnosis not present

## 2023-08-24 DIAGNOSIS — J069 Acute upper respiratory infection, unspecified: Secondary | ICD-10-CM | POA: Diagnosis not present

## 2023-08-24 DIAGNOSIS — R053 Chronic cough: Secondary | ICD-10-CM | POA: Diagnosis not present

## 2023-08-24 DIAGNOSIS — Z8619 Personal history of other infectious and parasitic diseases: Secondary | ICD-10-CM | POA: Diagnosis not present

## 2023-09-06 DIAGNOSIS — R051 Acute cough: Secondary | ICD-10-CM | POA: Diagnosis not present

## 2023-09-06 DIAGNOSIS — F5104 Psychophysiologic insomnia: Secondary | ICD-10-CM | POA: Diagnosis not present

## 2023-09-29 DIAGNOSIS — Z85828 Personal history of other malignant neoplasm of skin: Secondary | ICD-10-CM | POA: Diagnosis not present

## 2023-09-29 DIAGNOSIS — L821 Other seborrheic keratosis: Secondary | ICD-10-CM | POA: Diagnosis not present

## 2023-09-29 DIAGNOSIS — L245 Irritant contact dermatitis due to other chemical products: Secondary | ICD-10-CM | POA: Diagnosis not present

## 2023-09-29 DIAGNOSIS — D2272 Melanocytic nevi of left lower limb, including hip: Secondary | ICD-10-CM | POA: Diagnosis not present

## 2023-09-29 DIAGNOSIS — D1801 Hemangioma of skin and subcutaneous tissue: Secondary | ICD-10-CM | POA: Diagnosis not present

## 2023-09-29 DIAGNOSIS — D485 Neoplasm of uncertain behavior of skin: Secondary | ICD-10-CM | POA: Diagnosis not present

## 2023-09-29 DIAGNOSIS — I788 Other diseases of capillaries: Secondary | ICD-10-CM | POA: Diagnosis not present

## 2024-02-24 DIAGNOSIS — Z96651 Presence of right artificial knee joint: Secondary | ICD-10-CM | POA: Diagnosis not present

## 2024-02-24 DIAGNOSIS — Z96652 Presence of left artificial knee joint: Secondary | ICD-10-CM | POA: Diagnosis not present

## 2024-02-24 DIAGNOSIS — Z96642 Presence of left artificial hip joint: Secondary | ICD-10-CM | POA: Diagnosis not present

## 2024-02-24 DIAGNOSIS — M25551 Pain in right hip: Secondary | ICD-10-CM | POA: Diagnosis not present

## 2024-02-24 DIAGNOSIS — Z96653 Presence of artificial knee joint, bilateral: Secondary | ICD-10-CM | POA: Diagnosis not present

## 2024-03-03 DIAGNOSIS — M25551 Pain in right hip: Secondary | ICD-10-CM | POA: Diagnosis not present

## 2024-03-03 DIAGNOSIS — M25561 Pain in right knee: Secondary | ICD-10-CM | POA: Diagnosis not present

## 2024-03-03 DIAGNOSIS — M6281 Muscle weakness (generalized): Secondary | ICD-10-CM | POA: Diagnosis not present

## 2024-03-09 DIAGNOSIS — M6281 Muscle weakness (generalized): Secondary | ICD-10-CM | POA: Diagnosis not present

## 2024-03-09 DIAGNOSIS — M25561 Pain in right knee: Secondary | ICD-10-CM | POA: Diagnosis not present

## 2024-03-09 DIAGNOSIS — M25551 Pain in right hip: Secondary | ICD-10-CM | POA: Diagnosis not present

## 2024-03-13 DIAGNOSIS — M6281 Muscle weakness (generalized): Secondary | ICD-10-CM | POA: Diagnosis not present

## 2024-03-13 DIAGNOSIS — M25551 Pain in right hip: Secondary | ICD-10-CM | POA: Diagnosis not present

## 2024-03-13 DIAGNOSIS — M25561 Pain in right knee: Secondary | ICD-10-CM | POA: Diagnosis not present

## 2024-03-15 DIAGNOSIS — M25551 Pain in right hip: Secondary | ICD-10-CM | POA: Diagnosis not present

## 2024-03-15 DIAGNOSIS — M6281 Muscle weakness (generalized): Secondary | ICD-10-CM | POA: Diagnosis not present

## 2024-03-15 DIAGNOSIS — M25561 Pain in right knee: Secondary | ICD-10-CM | POA: Diagnosis not present

## 2024-03-20 DIAGNOSIS — M6281 Muscle weakness (generalized): Secondary | ICD-10-CM | POA: Diagnosis not present

## 2024-03-20 DIAGNOSIS — H5212 Myopia, left eye: Secondary | ICD-10-CM | POA: Diagnosis not present

## 2024-03-20 DIAGNOSIS — M25551 Pain in right hip: Secondary | ICD-10-CM | POA: Diagnosis not present

## 2024-03-20 DIAGNOSIS — M25561 Pain in right knee: Secondary | ICD-10-CM | POA: Diagnosis not present

## 2024-03-20 DIAGNOSIS — H353111 Nonexudative age-related macular degeneration, right eye, early dry stage: Secondary | ICD-10-CM | POA: Diagnosis not present

## 2024-03-20 DIAGNOSIS — Z961 Presence of intraocular lens: Secondary | ICD-10-CM | POA: Diagnosis not present

## 2024-03-22 DIAGNOSIS — M6281 Muscle weakness (generalized): Secondary | ICD-10-CM | POA: Diagnosis not present

## 2024-03-22 DIAGNOSIS — M25551 Pain in right hip: Secondary | ICD-10-CM | POA: Diagnosis not present

## 2024-03-22 DIAGNOSIS — M25561 Pain in right knee: Secondary | ICD-10-CM | POA: Diagnosis not present

## 2024-03-27 DIAGNOSIS — M25551 Pain in right hip: Secondary | ICD-10-CM | POA: Diagnosis not present

## 2024-03-27 DIAGNOSIS — M6281 Muscle weakness (generalized): Secondary | ICD-10-CM | POA: Diagnosis not present

## 2024-03-27 DIAGNOSIS — M25561 Pain in right knee: Secondary | ICD-10-CM | POA: Diagnosis not present

## 2024-03-29 DIAGNOSIS — M13852 Other specified arthritis, left hip: Secondary | ICD-10-CM | POA: Diagnosis not present

## 2024-03-29 DIAGNOSIS — M25561 Pain in right knee: Secondary | ICD-10-CM | POA: Diagnosis not present

## 2024-03-29 DIAGNOSIS — Z01419 Encounter for gynecological examination (general) (routine) without abnormal findings: Secondary | ICD-10-CM | POA: Diagnosis not present

## 2024-03-29 DIAGNOSIS — Z1231 Encounter for screening mammogram for malignant neoplasm of breast: Secondary | ICD-10-CM | POA: Diagnosis not present

## 2024-03-29 DIAGNOSIS — N958 Other specified menopausal and perimenopausal disorders: Secondary | ICD-10-CM | POA: Diagnosis not present

## 2024-03-29 DIAGNOSIS — Z6830 Body mass index (BMI) 30.0-30.9, adult: Secondary | ICD-10-CM | POA: Diagnosis not present

## 2024-03-29 DIAGNOSIS — M25551 Pain in right hip: Secondary | ICD-10-CM | POA: Diagnosis not present

## 2024-03-29 DIAGNOSIS — M6281 Muscle weakness (generalized): Secondary | ICD-10-CM | POA: Diagnosis not present

## 2024-04-14 DIAGNOSIS — M545 Low back pain, unspecified: Secondary | ICD-10-CM | POA: Diagnosis not present

## 2024-04-14 DIAGNOSIS — Z96651 Presence of right artificial knee joint: Secondary | ICD-10-CM | POA: Diagnosis not present

## 2024-04-25 ENCOUNTER — Other Ambulatory Visit (HOSPITAL_COMMUNITY): Payer: Self-pay | Admitting: Student

## 2024-04-25 DIAGNOSIS — Z96651 Presence of right artificial knee joint: Secondary | ICD-10-CM

## 2024-05-01 ENCOUNTER — Encounter (HOSPITAL_COMMUNITY)
Admission: RE | Admit: 2024-05-01 | Discharge: 2024-05-01 | Disposition: A | Discharge status: Home / Self Care | Source: Ambulatory Visit | Attending: Student | Admitting: Student

## 2024-05-01 ENCOUNTER — Encounter (HOSPITAL_COMMUNITY): Payer: Self-pay

## 2024-05-01 ENCOUNTER — Other Ambulatory Visit (HOSPITAL_COMMUNITY)

## 2024-05-01 DIAGNOSIS — M25569 Pain in unspecified knee: Secondary | ICD-10-CM | POA: Diagnosis not present

## 2024-05-01 DIAGNOSIS — Z96651 Presence of right artificial knee joint: Secondary | ICD-10-CM | POA: Diagnosis not present

## 2024-05-01 DIAGNOSIS — Z96653 Presence of artificial knee joint, bilateral: Secondary | ICD-10-CM | POA: Diagnosis not present

## 2024-05-01 MED ORDER — TECHNETIUM TC 99M MEDRONATE IV KIT
21.0000 | PACK | Freq: Once | INTRAVENOUS | Status: AC | PRN
  Administered 2024-05-01: 21 via INTRAVENOUS

## 2024-05-12 DIAGNOSIS — Z981 Arthrodesis status: Secondary | ICD-10-CM | POA: Diagnosis not present

## 2024-05-12 DIAGNOSIS — Z471 Aftercare following joint replacement surgery: Secondary | ICD-10-CM | POA: Diagnosis not present

## 2024-05-12 DIAGNOSIS — Z96653 Presence of artificial knee joint, bilateral: Secondary | ICD-10-CM | POA: Diagnosis not present

## 2024-06-07 DIAGNOSIS — M5416 Radiculopathy, lumbar region: Secondary | ICD-10-CM | POA: Diagnosis not present

## 2024-06-13 DIAGNOSIS — M51362 Other intervertebral disc degeneration, lumbar region with discogenic back pain and lower extremity pain: Secondary | ICD-10-CM | POA: Diagnosis not present

## 2024-06-13 DIAGNOSIS — E7800 Pure hypercholesterolemia, unspecified: Secondary | ICD-10-CM | POA: Diagnosis not present

## 2024-06-13 DIAGNOSIS — I10 Essential (primary) hypertension: Secondary | ICD-10-CM | POA: Diagnosis not present

## 2024-06-13 DIAGNOSIS — Z0000 Encounter for general adult medical examination without abnormal findings: Secondary | ICD-10-CM | POA: Diagnosis not present

## 2024-06-13 DIAGNOSIS — F5104 Psychophysiologic insomnia: Secondary | ICD-10-CM | POA: Diagnosis not present

## 2024-06-13 DIAGNOSIS — E559 Vitamin D deficiency, unspecified: Secondary | ICD-10-CM | POA: Diagnosis not present

## 2024-06-13 DIAGNOSIS — Z79899 Other long term (current) drug therapy: Secondary | ICD-10-CM | POA: Diagnosis not present

## 2024-06-13 DIAGNOSIS — F3342 Major depressive disorder, recurrent, in full remission: Secondary | ICD-10-CM | POA: Diagnosis not present

## 2024-06-20 DIAGNOSIS — M5416 Radiculopathy, lumbar region: Secondary | ICD-10-CM | POA: Diagnosis not present

## 2024-06-20 DIAGNOSIS — M4187 Other forms of scoliosis, lumbosacral region: Secondary | ICD-10-CM | POA: Diagnosis not present

## 2024-06-20 DIAGNOSIS — M5126 Other intervertebral disc displacement, lumbar region: Secondary | ICD-10-CM | POA: Diagnosis not present

## 2024-06-20 DIAGNOSIS — M48061 Spinal stenosis, lumbar region without neurogenic claudication: Secondary | ICD-10-CM | POA: Diagnosis not present

## 2024-06-20 DIAGNOSIS — M5136 Other intervertebral disc degeneration, lumbar region with discogenic back pain only: Secondary | ICD-10-CM | POA: Diagnosis not present

## 2024-07-06 DIAGNOSIS — Z6830 Body mass index (BMI) 30.0-30.9, adult: Secondary | ICD-10-CM | POA: Diagnosis not present

## 2024-07-06 DIAGNOSIS — M5416 Radiculopathy, lumbar region: Secondary | ICD-10-CM | POA: Diagnosis not present

## 2024-07-10 DIAGNOSIS — M5416 Radiculopathy, lumbar region: Secondary | ICD-10-CM | POA: Diagnosis not present

## 2024-07-15 ENCOUNTER — Other Ambulatory Visit: Payer: Self-pay

## 2024-07-15 ENCOUNTER — Encounter (HOSPITAL_BASED_OUTPATIENT_CLINIC_OR_DEPARTMENT_OTHER): Payer: Self-pay | Admitting: Emergency Medicine

## 2024-07-15 ENCOUNTER — Emergency Department (HOSPITAL_BASED_OUTPATIENT_CLINIC_OR_DEPARTMENT_OTHER)
Admission: EM | Admit: 2024-07-15 | Discharge: 2024-07-15 | Disposition: A | Discharge status: Home / Self Care | Attending: Emergency Medicine | Admitting: Emergency Medicine

## 2024-07-15 ENCOUNTER — Emergency Department (HOSPITAL_BASED_OUTPATIENT_CLINIC_OR_DEPARTMENT_OTHER)

## 2024-07-15 DIAGNOSIS — Z79899 Other long term (current) drug therapy: Secondary | ICD-10-CM | POA: Diagnosis not present

## 2024-07-15 DIAGNOSIS — S60512A Abrasion of left hand, initial encounter: Secondary | ICD-10-CM | POA: Diagnosis not present

## 2024-07-15 DIAGNOSIS — S0992XA Unspecified injury of nose, initial encounter: Secondary | ICD-10-CM | POA: Diagnosis present

## 2024-07-15 DIAGNOSIS — S0990XA Unspecified injury of head, initial encounter: Secondary | ICD-10-CM | POA: Diagnosis not present

## 2024-07-15 DIAGNOSIS — S022XXA Fracture of nasal bones, initial encounter for closed fracture: Secondary | ICD-10-CM | POA: Diagnosis not present

## 2024-07-15 DIAGNOSIS — W01198A Fall on same level from slipping, tripping and stumbling with subsequent striking against other object, initial encounter: Secondary | ICD-10-CM | POA: Insufficient documentation

## 2024-07-15 DIAGNOSIS — S0291XA Unspecified fracture of skull, initial encounter for closed fracture: Secondary | ICD-10-CM | POA: Diagnosis not present

## 2024-07-15 DIAGNOSIS — S0240DA Maxillary fracture, left side, initial encounter for closed fracture: Secondary | ICD-10-CM | POA: Diagnosis not present

## 2024-07-15 DIAGNOSIS — S0240CA Maxillary fracture, right side, initial encounter for closed fracture: Secondary | ICD-10-CM | POA: Diagnosis not present

## 2024-07-15 DIAGNOSIS — S0081XA Abrasion of other part of head, initial encounter: Secondary | ICD-10-CM | POA: Diagnosis not present

## 2024-07-15 DIAGNOSIS — S02401A Maxillary fracture, unspecified, initial encounter for closed fracture: Secondary | ICD-10-CM | POA: Diagnosis not present

## 2024-07-15 DIAGNOSIS — T797XXA Traumatic subcutaneous emphysema, initial encounter: Secondary | ICD-10-CM | POA: Diagnosis not present

## 2024-07-15 MED ORDER — FLUTICASONE PROPIONATE 50 MCG/ACT NA SUSP: 2.0000 | Freq: Every day | NASAL | 0 refills | Status: AC

## 2024-07-15 NOTE — ED Provider Notes (Signed)
 Christine [name] EMERGENCY DEPARTMENT AT [facility] Provider Note   CSN: [id number] Arrival date & time: 07/15/24  1112     Patient presents with: Christine [name] is a 73 y.o. female.    Fall   Patient is a 73 year old female here after mechanical fall that occurred when she tripped on a hose on the patio and landed face first on bricks.  She did not lose consciousness she endorses headache and some facial pain.  She is not on any anticoagulation.  She took a dose of Tylenol  around 10:30 AM this morning but had minimal improvement in her pain with this.  No nausea or vomiting no slurred speech or confusion no other associate symptoms.  Denies any hand arm neck pain or chest or belly pain.    Prior to Admission medications   Medication Sig Start Date End Date Taking? Authorizing Provider  fluticasone  (FLONASE ) 50 MCG/ACT nasal spray Place 2 sprays into both nostrils daily for 14 days. 07/15/24 07/29/24 Yes Christine [name], [place], PA  Cholecalciferol  (VITAMIN D) 2000 units tablet Take 2,000 Units by mouth daily.    [provider]  doxepin (SINEQUAN) 25 MG capsule Take 25 mg by mouth as needed.    [provider]  DULoxetine  (CYMBALTA ) 60 MG capsule Take 60 mg by mouth every morning.     [provider]  estradiol (ESTRACE) 1 MG tablet Take 1 mg by mouth daily.    [provider]  etodolac (LODINE) 500 MG tablet Take 500 mg by mouth 2 (two) times daily.    [provider]  lisinopril (PRINIVIL,ZESTRIL) 10 MG tablet Take 10 mg by mouth daily.    [provider]  metoprolol  tartrate (LOPRESSOR ) 100 MG tablet Take 1 tablet (100mg ) 2 hours prior to your Coronary CTA 11/08/18   Loni Soyla LABOR, MD  nitroGLYCERIN  (NITROSTAT ) 0.4 MG SL tablet Place 0.4 mg under the tongue every 5 (five) minutes as needed for chest pain.    [provider]  pantoprazole  (PROTONIX ) 20 MG tablet Take 20 mg by mouth daily.    [provider]    Allergies: Other, Hydrocodone, Amoxicillin, Codeine, Dilaudid  [hydromorphone  hcl], Keflex  [cephalexin ], Nickel, and Sulfa antibiotics    Review of Systems  Updated Vital Signs BP 129/88   Pulse 78   Temp 97.7 F (36.5 C) (Oral)   Resp 18   Ht 5' 4 (1.626 m)   Wt 85.4 kg   SpO2 99%   BMI 32.32 kg/m   Physical Exam Vitals and nursing note reviewed.  Constitutional:      General: She is not in acute distress. HENT:     Head: Normocephalic and atraumatic.     Nose: Nose normal.     Comments: Swelling to bridge of nose, abrasions to the left cheek    Mouth/Throat:     Mouth: Mucous membranes are moist.  Eyes:     General: No scleral icterus.    Comments: EOMI  Cardiovascular:     Rate and Rhythm: Normal rate and regular rhythm.     Pulses: Normal pulses.     Heart sounds: Normal heart sounds.  Pulmonary:     Effort: Pulmonary effort is normal. No respiratory distress.     Breath sounds: No wheezing.  Abdominal:     Palpations: Abdomen is soft.     Tenderness: There is no abdominal tenderness.  Musculoskeletal:     Cervical back: Normal range of motion.  Right lower leg: No edema.     Left lower leg: No edema.     Comments: No C, T, L-spine tenderness bilateral hands with normal strength does have small abrasions to the palm of left hand. Bilateral upper and lower extremities without focal bony tenderness  Skin:    General: Skin is warm and dry.     Capillary Refill: Capillary refill takes less than 2 seconds.  Neurological:     Mental Status: She is alert. Mental status is at baseline.  Psychiatric:        Mood and Affect: Mood normal.        Behavior: Behavior normal.     (all labs ordered are listed, but only abnormal results are displayed) Labs Reviewed - No data to display  EKG: None  Radiology: CT Maxillofacial Wo Contrast Result Date: 07/15/2024 EXAM: CT Face without contrast 07/15/2024 12:05:44 PM TECHNIQUE: CT of the face was  performed without the administration of intravenous contrast. Multiplanar reformatted images are provided for review. Automated exposure control, iterative reconstruction, and/or weight based adjustment of the mA/kV was utilized to reduce the radiation dose to as low as reasonably achievable. COMPARISON: None available CLINICAL HISTORY: Facial trauma, blunt. FINDINGS: AERODIGESTIVE TRACT: No mass. No edema. SALIVARY GLANDS: No acute abnormality. LYMPH NODES: No suspicious cervical lymphadenopathy. SOFT TISSUES: Extensive subcutaneous emphysema is present over the nose and extending over the face as well as the temporal fossa, left greater than right. BRAIN, ORBITS AND SINUSES: Bilateral lens replacements are noted. The globes and orbits are otherwise within normal limits. BONES: Minimally displaced fractures are present in the frontal process of the maxillary and the nasal bones. No additional fractures are present. The mandible is intact and located. Asymmetric degenerative changes are present at the right TMJ. Degenerative changes are present in the upper cervical spine. IMPRESSION: 1. Minimally displaced fractures of the frontal process of the maxillary and the nasal bones. 2. Extensive subcutaneous emphysema over the nose, face, and temporal fossa, left greater than right. 3. Asymmetric degenerative changes at the right TMJ. Electronically signed by: Lonni Necessary MD 07/15/2024 12:20 PM EDT RP Workstation: [id number]   CT Head Wo Contrast Result Date: 07/15/2024 EXAM: CT HEAD WITHOUT CONTRAST 07/15/2024 12:05:44 PM TECHNIQUE: CT of the head was performed without the administration of intravenous contrast. Automated exposure control, iterative reconstruction, and/or weight based adjustment of the mA/kV was utilized to reduce the radiation dose to as low as reasonably achievable. COMPARISON: None available. CLINICAL HISTORY: Head trauma, minor (Age >= 65y). FINDINGS: BRAIN AND VENTRICLES: No acute  hemorrhage. Gray-white differentiation is preserved. No hydrocephalus. No extra-axial collection. No mass effect or midline shift. Mild atrophy and white matter changes are within normal limits for age. ORBITS: No acute abnormality. SINUSES: In the right maxillary sinus, a polyp or mucous retention cyst is present. SOFT TISSUES AND SKULL: Minimally displaced fractures are present in the frontal process of the maxilla bilaterally as well as the nasal bones. Extensive subcutaneous emphysema is present over the face. IMPRESSION: 1. No acute intracranial abnormality. 2. Minimally displaced fractures in the frontal process of the maxilla bilaterally and nasal bones, with extensive subcutaneous emphysema over the face. Electronically signed by: Lonni Necessary MD 07/15/2024 12:16 PM EDT RP Workstation: [id number]     Procedures   Medications Ordered in the ED - No data to display  Medical Decision Making Amount and/or Complexity of Data Reviewed Radiology: ordered.   Patient is a 73 year old female here after mechanical fall that occurred when she tripped on a hose on the patio and landed face first on bricks.  She did not lose consciousness she endorses headache and some facial pain.  She is not on any anticoagulation.  She took a dose of Tylenol  around 10:30 AM this morning but had minimal improvement in her pain with this.  No nausea or vomiting no slurred speech or confusion no other associate symptoms.  Denies any hand arm neck pain or chest or belly pain.  CT max face and CT head unremarkable apart from nasal bone fractures.  Patient has no septal hematoma  Normal vital signs here.  Reassuring trauma workup.  Will prescribe fluticasone  2 sprays each nostril daily to help with decongesting and recommend the patient not to blow her nose she does have subcutaneous emphysema in the face.  Recommend follow-up with the ENT.  Briefly discussed this case with my  supervising physician Dr. Levander who is in agreement with my plan.  Tylenol  and ibuprofen at home.  Return precautions emergency room provided.   Final diagnoses:  Closed fracture of nasal bone, initial encounter  Injury of head, initial encounter    ED Discharge Orders          Ordered    fluticasone  (FLONASE ) 50 MCG/ACT nasal spray  Daily        07/15/24 1323               Neldon Inoue [place], [place] 07/15/24 1853    Levander [place], MD 07/18/24 [id number]

## 2024-07-15 NOTE — Discharge Instructions (Addendum)
 You are found to have nasal bone fractures today.  Ice applied to the bridge of the nose, Tylenol , ibuprofen and time both of these injuries.  I have given you the information for an ear nose and throat doctor to follow-up with.

## 2024-07-15 NOTE — ED Triage Notes (Signed)
 Pt caox4 c/o injury to the face from mechanical fall while outside, states she hit face on brick patio. Bruising and redness to nose, abrasion to L cheek, denies LOC. Minor abrasion to L palm. Last took tylenol  at 1030  which has helped. Does not take blood thinner med.

## 2024-07-15 NOTE — ED Notes (Signed)
 Fluids encouraged, patient states she just finished a 20 oz bottle of water, and agreed to Ginger Ale; given as requested.

## 2024-08-10 DIAGNOSIS — M5416 Radiculopathy, lumbar region: Secondary | ICD-10-CM | POA: Diagnosis not present

## 2024-08-22 DIAGNOSIS — M5416 Radiculopathy, lumbar region: Secondary | ICD-10-CM | POA: Diagnosis not present

## 2024-09-07 DIAGNOSIS — Z23 Encounter for immunization: Secondary | ICD-10-CM | POA: Diagnosis not present

## 2024-09-07 DIAGNOSIS — M79675 Pain in left toe(s): Secondary | ICD-10-CM | POA: Diagnosis not present

## 2024-09-28 DIAGNOSIS — Z6831 Body mass index (BMI) 31.0-31.9, adult: Secondary | ICD-10-CM | POA: Diagnosis not present

## 2024-09-28 DIAGNOSIS — M47817 Spondylosis without myelopathy or radiculopathy, lumbosacral region: Secondary | ICD-10-CM | POA: Diagnosis not present

## 2024-10-05 DIAGNOSIS — E538 Deficiency of other specified B group vitamins: Secondary | ICD-10-CM | POA: Diagnosis not present

## 2024-10-16 DIAGNOSIS — M47817 Spondylosis without myelopathy or radiculopathy, lumbosacral region: Secondary | ICD-10-CM | POA: Diagnosis not present

## 2024-10-23 DIAGNOSIS — Z6830 Body mass index (BMI) 30.0-30.9, adult: Secondary | ICD-10-CM | POA: Diagnosis not present

## 2024-10-23 DIAGNOSIS — M47817 Spondylosis without myelopathy or radiculopathy, lumbosacral region: Secondary | ICD-10-CM | POA: Diagnosis not present
